# Patient Record
Sex: Female | Born: 2000 | Race: White | Hispanic: No | Marital: Single | Smoking: Former smoker
Health system: Southern US, Community
[De-identification: ages and names within clinical notes are randomized; demographics above are authoritative.]

## PROBLEM LIST (undated history)

## (undated) ENCOUNTER — Inpatient Hospital Stay (HOSPITAL_COMMUNITY): Payer: Self-pay

## (undated) DIAGNOSIS — F99 Mental disorder, not otherwise specified: Secondary | ICD-10-CM

## (undated) DIAGNOSIS — F909 Attention-deficit hyperactivity disorder, unspecified type: Secondary | ICD-10-CM

## (undated) DIAGNOSIS — F32A Depression, unspecified: Secondary | ICD-10-CM

## (undated) DIAGNOSIS — F319 Bipolar disorder, unspecified: Secondary | ICD-10-CM

## (undated) DIAGNOSIS — F419 Anxiety disorder, unspecified: Secondary | ICD-10-CM

## (undated) DIAGNOSIS — T50901A Poisoning by unspecified drugs, medicaments and biological substances, accidental (unintentional), initial encounter: Secondary | ICD-10-CM

## (undated) DIAGNOSIS — R569 Unspecified convulsions: Secondary | ICD-10-CM

## (undated) DIAGNOSIS — T7840XA Allergy, unspecified, initial encounter: Secondary | ICD-10-CM

## (undated) DIAGNOSIS — F329 Major depressive disorder, single episode, unspecified: Secondary | ICD-10-CM

## (undated) HISTORY — DX: Unspecified convulsions: R56.9

---
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
## 2020-05-11 ENCOUNTER — Ambulatory Visit
Admission: EM | Admit: 2020-05-11 | Discharge: 2020-05-11 | Disposition: A | Discharge status: Home / Self Care | Payer: Medicaid Other | Attending: Emergency Medicine | Admitting: Emergency Medicine

## 2020-05-11 DIAGNOSIS — M25512 Pain in left shoulder: Secondary | ICD-10-CM

## 2020-05-11 DIAGNOSIS — G8929 Other chronic pain: Secondary | ICD-10-CM | POA: Diagnosis not present

## 2020-05-11 MED ORDER — NAPROXEN 500 MG PO TABS: 500.0000 mg | ORAL_TABLET | Freq: Two times a day (BID) | ORAL | 0 refills | Status: DC

## 2020-05-11 NOTE — ED Provider Notes (Signed)
[facility]    CSN: [id number] Arrival date & time: 05/11/20  1634      History   Chief Complaint Chief Complaint  Patient presents with  . shoulder tenderness    HPI Ellen Huffman is a 19 y.o. female with history of allergies, depression presenting for left shoulder/axilla.  States is been ongoing for the last 3 weeks.  Patient denies trauma to area, upper extremity numbness, weakness.  No neck pain, fever.  Patient's mother does have history of lupus and is on blood thinner for remote history of pulmonary emboli (unprovoked).  Patient denies history of lupus, blood clot.  No OCP use, recent travel or prolonged stasis.  Has tried stretching without relief.  No chest pain, palpitations, difficulty breathing, cough.   Past Medical History:  Diagnosis Date  . Allergy   . Depression   . Mental disorder   . Overdose Jan 2015    Patient Active Problem List   Diagnosis Date Noted  . Autonomic dysfunction 09/17/2017  . Mild headache 09/17/2017  . Anxiety state 09/17/2017  . Palpitations 09/17/2017  . Deliberate self-cutting 07/14/2014  . MDD (major depressive disorder), recurrent severe, 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  . Cannabis abuse 07/14/2014  . Suicide attempt (HCC) 07/14/2014  . 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 GAD (generalized anxiety disorder) 09/15/2013    History reviewed. No pertinent surgical history.  OB History   No obstetric history on file.      Home Medications    Prior to Admission medications   Medication Sig Start Date End Date Taking? Authorizing Provider  naproxen (NAPROSYN) 500 MG tablet Take 1 tablet (500 mg total) by mouth 2 (two) times daily. 05/11/20   Hall-Potvin, [place], PA-C    Family History Family History  Problem Relation Age of Onset  . Migraines Neg Hx   . Parkinsonism Neg Hx   . Seizures Neg Hx   . ADD / ADHD Neg Hx   . Bipolar disorder Neg Hx     Social  History Social History   Tobacco Use  . Smoking status: Never Smoker  . Smokeless tobacco: Never Used  Substance Use Topics  . Alcohol use: No  . Drug use: Yes    Types: Marijuana     Allergies   Penicillins   Review of Systems As per HPI   Physical Exam Triage Vital Signs ED Triage Vitals  Enc Vitals Group     BP 05/11/20 1643 123/77     Pulse Rate 05/11/20 1643 (!) 104     Resp 05/11/20 1643 16     Temp 05/11/20 1643 99.4 F (37.4 C)     Temp Source 05/11/20 1643 Oral     SpO2 05/11/20 1643 97 %     Weight --      Height --      Head Circumference --      Peak Flow --      Pain Score 05/11/20 1644 0     Pain Loc --      Pain Edu? --      Excl. in GC? --    No data found.  Updated Vital Signs BP 123/77 (BP Location: Left Arm)   Pulse (!) 104   Temp 99.4 F (37.4 C) (Oral)   Resp 16   LMP 04/27/2020   SpO2 97%   Visual Acuity Right Eye Distance:   Left Eye Distance:   Bilateral Distance:    Right Eye  Near:   Left Eye Near:    Bilateral Near:     Physical Exam Constitutional:      General: She is not in acute distress. HENT:     Head: Normocephalic and atraumatic.  Eyes:     General: No scleral icterus.    Pupils: Pupils are equal, round, and reactive to light.  Cardiovascular:     Rate and Rhythm: Normal rate.  Pulmonary:     Effort: Pulmonary effort is normal.  Musculoskeletal:     Cervical back: Normal range of motion and neck supple. No rigidity or tenderness.     Comments: Full active ROM of upper extremities bilaterally.  5/5 strength bilaterally.  Neurovascularly intact bilaterally.  Patient does report decreased sensation to touch in left upper extremity diffusely.  Lymphadenopathy:     Cervical: No cervical adenopathy.  Skin:    Capillary Refill: Capillary refill takes less than 2 seconds.     Coloration: Skin is not jaundiced or pale.     Findings: No bruising, erythema or rash.  Neurological:     Mental Status: She is alert  and oriented to person, place, and time.      UC Treatments / Results  Labs (all labs ordered are listed, but only abnormal results are displayed) Labs Reviewed - No data to display  EKG   Radiology No results found.  Procedures Procedures (including critical care time)  Medications Ordered in UC Medications - No data to display  Initial Impression / Assessment and Plan / UC Course  I have reviewed the triage vital signs and the nursing notes.  Pertinent labs & imaging results that were available during my care of the patient were reviewed by me and considered in my medical decision making (see chart for details).     Patient afebrile, nontoxic, and hemodynamically stable in office.  Exam largely unremarkable except for patient reported sensory change in left arm.  Patient with low risk of ACS given unremarkable personal and family cardiac history.  Will trial rice, stretches as outlined below.  Return precautions discussed, patient verbalized understanding and is agreeable to plan. Final Clinical Impressions(s) / UC Diagnoses   Final diagnoses:  Chronic left shoulder pain     Discharge Instructions     Recommend RICE: rest, ice, compression, elevation as needed for pain.    Heat therapy (hot compress, warm wash rag, hot showers, etc.) can help relax muscles and soothe muscle aches. Cold therapy (ice packs) can be used to help swelling both after injury and after prolonged use of areas of chronic pain/aches.  For pain: Take naproxen twice daily with food. May take Tylenol additionally if needed.  Return for worsening pain, swelling, numbness, discoloration, chest pain, difficulty breathing.    ED Prescriptions    Medication Sig Dispense Auth. Provider   naproxen (NAPROSYN) 500 MG tablet Take 1 tablet (500 mg total) by mouth 2 (two) times daily. 30 tablet Hall-Potvin, [place], PA-C     I have reviewed the PDMP during this encounter.   Hall-Potvin, [place],  [place] 05/12/20 1234

## 2020-05-11 NOTE — Discharge Instructions (Signed)
Recommend RICE: rest, ice, compression, elevation as needed for pain.    Heat therapy (hot compress, warm wash rag, hot showers, etc.) can help relax muscles and soothe muscle aches. Cold therapy (ice packs) can be used to help swelling both after injury and after prolonged use of areas of chronic pain/aches.  For pain: Take naproxen twice daily with food. May take Tylenol additionally if needed.  Return for worsening pain, swelling, numbness, discoloration, chest pain, difficulty breathing.

## 2020-05-11 NOTE — ED Triage Notes (Signed)
Pt c/o of tender to touch and a different feeling to lt upper shoulder/under arm area for the past 3wks. States feels different then rt side. Pt denies pain or injury.

## 2020-09-04 ENCOUNTER — Encounter: Payer: Self-pay | Admitting: Emergency Medicine

## 2020-09-04 ENCOUNTER — Ambulatory Visit
Admission: EM | Admit: 2020-09-04 | Discharge: 2020-09-04 | Disposition: A | Discharge status: Home / Self Care | Payer: Medicaid Other | Attending: Emergency Medicine | Admitting: Emergency Medicine

## 2020-09-04 ENCOUNTER — Other Ambulatory Visit: Payer: Self-pay

## 2020-09-04 DIAGNOSIS — R110 Nausea: Secondary | ICD-10-CM | POA: Diagnosis not present

## 2020-09-04 DIAGNOSIS — F1113 Opioid abuse with withdrawal: Secondary | ICD-10-CM

## 2020-09-04 LAB — POCT URINALYSIS DIP (MANUAL ENTRY)
Bilirubin, UA: NEGATIVE
Blood, UA: NEGATIVE
Glucose, UA: NEGATIVE mg/dL
Leukocytes, UA: NEGATIVE
Nitrite, UA: NEGATIVE
Protein Ur, POC: 30 mg/dL — AB
Spec Grav, UA: >=1.03 — AB (ref 1.010–1.025)
Urobilinogen, UA: 0.2 E.U./dL
pH, UA: 5.5 (ref 5.0–8.0)

## 2020-09-04 MED ORDER — ONDANSETRON 4 MG PO TBDP: 4.0000 mg | ORAL_TABLET | Freq: Three times a day (TID) | ORAL | 0 refills | Status: DC | PRN

## 2020-09-04 NOTE — ED Provider Notes (Signed)
[facility]    CSN: [id number] Arrival date & time: 09/04/20  1348      History   Chief Complaint Chief Complaint  Patient presents with  . Dysuria  . Withdrawal    HPI Ellen Huffman is a 19 y.o. female  Presenting for dysuria, nausea for the last few days.  Patient denies urinary frequency, polydipsia, polyphagia, abdominal pain, vaginal pain, pelvic pain, vaginal discharge.  No change in diet, lifestyle.  Does admit to daily Percocet use until Monday: No chest pain, tremulousness, palpitations, severe abdominal pain or diarrhea, vomiting.  Denies SI/HI.  Has been seen by behavioral health in the past, working to find therapist currently.  Past Medical History:  Diagnosis Date  . Allergy   . Depression   . Mental disorder   . Overdose Jan 2015    Patient Active Problem List   Diagnosis Date Noted  . Autonomic dysfunction 09/17/2017  . Mild headache 09/17/2017  . Anxiety state 09/17/2017  . Palpitations 09/17/2017  . Deliberate self-cutting 07/14/2014  . MDD (major depressive disorder), recurrent severe, 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  . Cannabis abuse 07/14/2014  . Suicide attempt (HCC) 07/14/2014  . 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 GAD (generalized anxiety disorder) 09/15/2013    History reviewed. No pertinent surgical history.  OB History   No obstetric history on file.      Home Medications    Prior to Admission medications   Medication Sig Start Date End Date Taking? Authorizing Provider  naproxen (NAPROSYN) 500 MG tablet Take 1 tablet (500 mg total) by mouth 2 (two) times daily. 05/11/20   Hall-Potvin, [place], PA-C  ondansetron (ZOFRAN ODT) 4 MG disintegrating tablet Take 1 tablet (4 mg total) by mouth every 8 (eight) hours as needed for nausea or vomiting. 09/04/20   Hall-Potvin, [place], PA-C    Family History Family History  Problem Relation Age of Onset  . Migraines Neg Hx   .  Parkinsonism Neg Hx   . Seizures Neg Hx   . ADD / ADHD Neg Hx   . Bipolar disorder Neg Hx     Social History Social History   Tobacco Use  . Smoking status: Never Smoker  . Smokeless tobacco: Never Used  Substance Use Topics  . Alcohol use: No  . Drug use: Yes    Types: Marijuana    Comment: percocet     Allergies   Penicillins   Review of Systems As per HPI   Physical Exam Triage Vital Signs ED Triage Vitals  Enc Vitals Group     BP 09/04/20 1502 137/70     Pulse Rate 09/04/20 1502 (!) 130     Resp 09/04/20 1502 18     Temp 09/04/20 1502 98.6 F (37 C)     Temp Source 09/04/20 1502 Oral     SpO2 09/04/20 1502 98 %     Weight --      Height --      Head Circumference --      Peak Flow --      Pain Score 09/04/20 1503 3     Pain Loc --      Pain Edu? --      Excl. in GC? --    No data found.  Updated Vital Signs BP 137/70 (BP Location: Left Arm)   Pulse (!) 106   Temp 98.6 F (37 C) (Oral)   Resp 18   SpO2 98%   Visual  Acuity Right Eye Distance:   Left Eye Distance:   Bilateral Distance:    Right Eye Near:   Left Eye Near:    Bilateral Near:     Physical Exam Constitutional:      General: She is not in acute distress. HENT:     Head: Normocephalic and atraumatic.  Eyes:     General: No scleral icterus.    Pupils: Pupils are equal, round, and reactive to light.  Cardiovascular:     Rate and Rhythm: Normal rate.  Pulmonary:     Effort: Pulmonary effort is normal.  Abdominal:     General: Bowel sounds are normal.     Palpations: Abdomen is soft.     Tenderness: There is no abdominal tenderness. There is no right CVA tenderness, left CVA tenderness or guarding.  Skin:    Capillary Refill: Capillary refill takes less than 2 seconds.     Coloration: Skin is not jaundiced or pale.  Neurological:     General: No focal deficit present.     Mental Status: She is alert and oriented to person, place, and time.     Deep Tendon Reflexes:  Reflexes normal.     Comments: No tremulousness or diaphoresis      UC Treatments / Results  Labs (all labs ordered are listed, but only abnormal results are displayed) Labs Reviewed  POCT URINALYSIS DIP (MANUAL ENTRY) - Abnormal; Notable for the following components:      Result Value   Ketones, POC UA trace (5) (*)    Spec Grav, UA >=1.030 (*)    Protein Ur, POC =30 (*)    All other components within normal limits    EKG   Radiology No results found.  Procedures Procedures (including critical care time)  Medications Ordered in UC Medications - No data to display  Initial Impression / Assessment and Plan / UC Course  I have reviewed the triage vital signs and the nursing notes.  Pertinent labs & imaging results that were available during my care of the patient were reviewed by me and considered in my medical decision making (see chart for details).     Patient afebrile, nontoxic in office today.  Urine dipstick with protein, elevated specific gravity, trace ketones.  No neurocognitive deficit.  COWS mild.  Reviewed supportive care, provided resources for both mental health and opioid abuse/withdrawal.  Return precautions discussed, pt verbalized understanding and is agreeable to plan. Final Clinical Impressions(s) / UC Diagnoses   Final diagnoses:  Nausea without vomiting  Opioid abuse with withdrawal     Discharge Instructions     Zofran for nausea. Go to ER for shakiness, chest pain, palpitations.    ED Prescriptions    Medication Sig Dispense Auth. Provider   ondansetron (ZOFRAN ODT) 4 MG disintegrating tablet Take 1 tablet (4 mg total) by mouth every 8 (eight) hours as needed for nausea or vomiting. 21 tablet Hall-Potvin, [place], PA-C     PDMP not reviewed this encounter.   Hall-Potvin, [place], [place] 09/04/20 1648

## 2020-09-04 NOTE — ED Triage Notes (Signed)
Pt here for dysuria; pt also sts withdrawal sx from opioids; pt sts was taking 15-30mg  percocet daily up until Monday and has now quit taking; pt sts not feeling well having some nausea and not sleeping well; denies SI/HI

## 2020-09-04 NOTE — Discharge Instructions (Addendum)
Zofran for nausea. Go to ER for shakiness, chest pain, palpitations.

## 2020-10-25 ENCOUNTER — Ambulatory Visit
Admission: EM | Admit: 2020-10-25 | Discharge: 2020-10-25 | Disposition: A | Discharge status: Home / Self Care | Payer: Medicaid Other | Attending: Emergency Medicine | Admitting: Emergency Medicine

## 2020-10-25 ENCOUNTER — Other Ambulatory Visit: Payer: Self-pay

## 2020-10-25 DIAGNOSIS — R0781 Pleurodynia: Secondary | ICD-10-CM

## 2020-10-25 DIAGNOSIS — Z1152 Encounter for screening for COVID-19: Secondary | ICD-10-CM

## 2020-10-25 MED ORDER — NAPROXEN 500 MG PO TABS: 500.0000 mg | ORAL_TABLET | Freq: Two times a day (BID) | ORAL | 0 refills | Status: DC

## 2020-10-25 MED ORDER — ALBUTEROL SULFATE HFA 108 (90 BASE) MCG/ACT IN AERS: 2.0000 | INHALATION_SPRAY | Freq: Four times a day (QID) | RESPIRATORY_TRACT | 2 refills | Status: DC | PRN

## 2020-10-25 NOTE — ED Triage Notes (Signed)
Pt complains of a sore throat and chest tightness when taking a full breath since yesterday. Pt also notes some pain when taking a full breath as well. Pt is aox4 and ambulatory.

## 2020-10-25 NOTE — Discharge Instructions (Addendum)
Go to ER for worsening pain, difficulty breathing, or you pass out.

## 2020-10-25 NOTE — ED Provider Notes (Signed)
[facility]    CSN: [id number] Arrival date & time: 10/25/20  1415      History   Chief Complaint Chief Complaint  Patient presents with  . Sore Throat    since yesterday    HPI Ellen Huffman is a 19 y.o. female  With history as below presenting for sore throat and chest tightness with inspiration since yesterday.  Patient also states deep breaths causes sternum to hurt.  Denies tenderness palpation, shortness of breath, palpitations or lightheadedness.  Has not taken thing for this.  Denies recent illness or known sick contacts.  Denies IV drug use.  Past Medical History:  Diagnosis Date  . Allergy   . Depression   . Mental disorder   . Overdose Jan 2015    Patient Active Problem List   Diagnosis Date Noted  . Autonomic dysfunction 09/17/2017  . Mild headache 09/17/2017  . Anxiety state 09/17/2017  . Palpitations 09/17/2017  . Deliberate self-cutting 07/14/2014  . MDD (major depressive disorder), recurrent severe, 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  . Cannabis abuse 07/14/2014  . Suicide attempt (HCC) 07/14/2014  . 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 GAD (generalized anxiety disorder) 09/15/2013    History reviewed. No pertinent surgical history.  OB History   No obstetric history on file.      Home Medications    Prior to Admission medications   Medication Sig Start Date End Date Taking? Authorizing Provider  albuterol (VENTOLIN HFA) 108 (90 Base) MCG/ACT inhaler Inhale 2 puffs into the lungs every 6 (six) hours as needed for wheezing or shortness of breath. 10/25/20   Hall-Potvin, [place], PA-C  naproxen (NAPROSYN) 500 MG tablet Take 1 tablet (500 mg total) by mouth 2 (two) times daily. 10/25/20   Hall-Potvin, [place], PA-C    Family History Family History  Problem Relation Age of Onset  . Migraines Neg Hx   . Parkinsonism Neg Hx   . Seizures Neg Hx   . ADD / ADHD Neg Hx   . Bipolar disorder  Neg Hx     Social History Social History   Tobacco Use  . Smoking status: Never Smoker  . Smokeless tobacco: Never Used  Vaping Use  . Vaping Use: Every day  Substance Use Topics  . Alcohol use: No  . Drug use: Yes    Types: Marijuana    Comment: percocet     Allergies   Penicillins   Review of Systems Review of Systems  Constitutional: Negative for fatigue and fever.  HENT: Positive for sore throat. Negative for ear pain, sinus pain, trouble swallowing and voice change.   Eyes: Negative for pain, redness and visual disturbance.  Respiratory: Positive for chest tightness. Negative for cough and shortness of breath.   Cardiovascular: Negative for chest pain and palpitations.  Gastrointestinal: Negative for abdominal pain, diarrhea and vomiting.  Musculoskeletal: Negative for arthralgias and myalgias.  Skin: Negative for rash and wound.  Neurological: Negative for syncope and headaches.     Physical Exam Triage Vital Signs ED Triage Vitals  Enc Vitals Group     BP      Pulse      Resp      Temp      Temp src      SpO2      Weight      Height      Head Circumference      Peak Flow      Pain Score  Pain Loc      Pain Edu?      Excl. in GC?    No data found.  Updated Vital Signs BP 114/69 (BP Location: Right Arm)   Pulse (!) 109   Temp 99.2 F (37.3 C) (Oral)   Resp 18   LMP 10/24/2020 (Exact Date)   SpO2 96%   Visual Acuity Right Eye Distance:   Left Eye Distance:   Bilateral Distance:    Right Eye Near:   Left Eye Near:    Bilateral Near:     Physical Exam Constitutional:      General: She is not in acute distress.    Appearance: She is well-developed and normal weight. She is not ill-appearing or diaphoretic.  HENT:     Head: Normocephalic and atraumatic.     Right Ear: Tympanic membrane and ear canal normal.     Left Ear: Tympanic membrane and ear canal normal.     Mouth/Throat:     Mouth: Mucous membranes are moist.      Pharynx: Oropharynx is clear. Uvula midline. No oropharyngeal exudate, posterior oropharyngeal erythema or uvula swelling.     Tonsils: No tonsillar exudate.  Eyes:     General: No scleral icterus.    Conjunctiva/sclera: Conjunctivae normal.     Pupils: Pupils are equal, round, and reactive to light.  Neck:     Comments: Trachea midline, negative JVD Cardiovascular:     Rate and Rhythm: Normal rate and regular rhythm.     Heart sounds: No murmur heard.  No friction rub. No gallop.   Pulmonary:     Effort: Pulmonary effort is normal. No respiratory distress.     Breath sounds: No wheezing, rhonchi or rales.  Musculoskeletal:     Cervical back: Neck supple. No tenderness.  Lymphadenopathy:     Cervical: No cervical adenopathy.  Skin:    Capillary Refill: Capillary refill takes less than 2 seconds.     Coloration: Skin is not jaundiced or pale.     Findings: No rash.  Neurological:     General: No focal deficit present.     Mental Status: She is alert and oriented to person, place, and time.      UC Treatments / Results  Labs (all labs ordered are listed, but only abnormal results are displayed) Labs Reviewed  NOVEL CORONAVIRUS, NAA    EKG   Radiology No results found.  Procedures Procedures (including critical care time)  Medications Ordered in UC Medications - No data to display  Initial Impression / Assessment and Plan / UC Course  I have reviewed the triage vital signs and the nursing notes.  Pertinent labs & imaging results that were available during my care of the patient were reviewed by me and considered in my medical decision making (see chart for details).     Patient afebrile, nontoxic, with SpO2 96%.  EKG done in office w/o previous to compare: Normal sinus rhythm with ventricular rate 70 bpm.  No QTC prolongation, ST elevation or depression.  Low concern for pericarditis.  Covid PCR pending.  Patient to quarantine until results are back.  We will  treat supportively as outlined below.  Return precautions discussed, patient verbalized understanding and is agreeable to plan. Final Clinical Impressions(s) / UC Diagnoses   Final diagnoses:  Encounter for screening for COVID-19  Pleuritic chest pain     Discharge Instructions     Go to ER for worsening pain, difficulty breathing, or you pass  out.    ED Prescriptions    Medication Sig Dispense Auth. Provider   albuterol (VENTOLIN HFA) 108 (90 Base) MCG/ACT inhaler Inhale 2 puffs into the lungs every 6 (six) hours as needed for wheezing or shortness of breath. 8 g Hall-Potvin, [place], PA-C   naproxen (NAPROSYN) 500 MG tablet Take 1 tablet (500 mg total) by mouth 2 (two) times daily. 30 tablet Hall-Potvin, [place], PA-C     PDMP not reviewed this encounter.   Hall-Potvin, [place], [place] 10/25/20 1509

## 2020-10-27 LAB — NOVEL CORONAVIRUS, NAA: SARS-CoV-2, NAA: NOT DETECTED

## 2020-10-27 LAB — SARS-COV-2, NAA 2 DAY TAT

## 2020-11-03 ENCOUNTER — Ambulatory Visit (HOSPITAL_COMMUNITY): Payer: Medicaid Other | Admitting: Clinical

## 2020-12-25 DIAGNOSIS — R569 Unspecified convulsions: Secondary | ICD-10-CM

## 2020-12-25 HISTORY — DX: Unspecified convulsions: R56.9

## 2021-03-07 ENCOUNTER — Ambulatory Visit
Admission: EM | Admit: 2021-03-07 | Discharge: 2021-03-07 | Disposition: A | Discharge status: Home / Self Care | Payer: Medicaid Other

## 2021-03-31 ENCOUNTER — Encounter (HOSPITAL_COMMUNITY): Payer: Self-pay | Admitting: Psychiatry

## 2021-03-31 ENCOUNTER — Other Ambulatory Visit: Payer: Self-pay

## 2021-03-31 ENCOUNTER — Telehealth (INDEPENDENT_AMBULATORY_CARE_PROVIDER_SITE_OTHER): Payer: Medicaid Other | Admitting: Psychiatry

## 2021-03-31 DIAGNOSIS — F1114 Opioid abuse with opioid-induced mood disorder: Secondary | ICD-10-CM

## 2021-03-31 DIAGNOSIS — F411 Generalized anxiety disorder: Secondary | ICD-10-CM

## 2021-03-31 MED ORDER — TRAZODONE HCL 50 MG PO TABS: 50.0000 mg | ORAL_TABLET | Freq: Every day | ORAL | 2 refills | Status: DC

## 2021-03-31 MED ORDER — HYDROXYZINE HCL 10 MG PO TABS: 10.0000 mg | ORAL_TABLET | Freq: Three times a day (TID) | ORAL | 2 refills | Status: DC | PRN

## 2021-03-31 MED ORDER — ARIPIPRAZOLE 5 MG PO TABS: 5.0000 mg | ORAL_TABLET | Freq: Every day | ORAL | 2 refills | Status: DC
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
## 2021-04-06 ENCOUNTER — Ambulatory Visit: Payer: Medicaid Other | Admitting: Nurse Practitioner

## 2021-06-11 ENCOUNTER — Emergency Department (HOSPITAL_COMMUNITY): Payer: Medicaid Other

## 2021-06-11 ENCOUNTER — Emergency Department (HOSPITAL_COMMUNITY)
Admission: EM | Admit: 2021-06-11 | Discharge: 2021-06-12 | Disposition: A | Discharge status: Home / Self Care | Payer: Medicaid Other | Attending: Emergency Medicine | Admitting: Emergency Medicine

## 2021-06-11 DIAGNOSIS — F13239 Sedative, hypnotic or anxiolytic dependence with withdrawal, unspecified: Secondary | ICD-10-CM | POA: Diagnosis not present

## 2021-06-11 DIAGNOSIS — Z5321 Procedure and treatment not carried out due to patient leaving prior to being seen by health care provider: Secondary | ICD-10-CM | POA: Insufficient documentation

## 2021-06-11 DIAGNOSIS — R569 Unspecified convulsions: Secondary | ICD-10-CM | POA: Diagnosis present

## 2021-06-11 LAB — COMPREHENSIVE METABOLIC PANEL
ALT: 14 U/L (ref 0–44)
AST: 19 U/L (ref 15–41)
Albumin: 4.1 g/dL (ref 3.5–5.0)
Alkaline Phosphatase: 56 U/L (ref 38–126)
Anion gap: 9 (ref 5–15)
BUN: 8 mg/dL (ref 6–20)
CO2: 27 mmol/L (ref 22–32)
Calcium: 9 mg/dL (ref 8.9–10.3)
Chloride: 104 mmol/L (ref 98–111)
Creatinine, Ser: 0.75 mg/dL (ref 0.44–1.00)
GFR, Estimated: >60 mL/min (ref >60)
Glucose, Bld: 60 mg/dL — ABNORMAL LOW (ref 70–99)
Potassium: 3.7 mmol/L (ref 3.5–5.1)
Sodium: 140 mmol/L (ref 135–145)
Total Bilirubin: 0.3 mg/dL (ref 0.3–1.2)
Total Protein: 7.6 g/dL (ref 6.5–8.1)

## 2021-06-11 LAB — ETHANOL: Alcohol, Ethyl (B): <10 mg/dL (ref <10)

## 2021-06-11 LAB — CBC WITH DIFFERENTIAL/PLATELET
Abs Immature Granulocytes: 0.01 10*3/uL (ref 0.00–0.07)
Basophils Absolute: 0 10*3/uL (ref 0.0–0.1)
Basophils Relative: 1 %
Eosinophils Absolute: 0.1 10*3/uL (ref 0.0–0.5)
Eosinophils Relative: 2 %
HCT: 45.8 % (ref 36.0–46.0)
Hemoglobin: 14.7 g/dL (ref 12.0–15.0)
Immature Granulocytes: 0 %
Lymphocytes Relative: 38 %
Lymphs Abs: 1.7 10*3/uL (ref 0.7–4.0)
MCH: 28.2 pg (ref 26.0–34.0)
MCHC: 32.1 g/dL (ref 30.0–36.0)
MCV: 87.7 fL (ref 80.0–100.0)
Monocytes Absolute: 0.3 10*3/uL (ref 0.1–1.0)
Monocytes Relative: 7 %
Neutro Abs: 2.4 10*3/uL (ref 1.7–7.7)
Neutrophils Relative %: 52 %
Platelets: 274 10*3/uL (ref 150–400)
RBC: 5.22 MIL/uL — ABNORMAL HIGH (ref 3.87–5.11)
RDW: 15.2 % (ref 11.5–15.5)
WBC: 4.5 10*3/uL (ref 4.0–10.5)
nRBC: 0 % (ref 0.0–0.2)

## 2021-06-11 LAB — RAPID URINE DRUG SCREEN, HOSP PERFORMED
Amphetamines: NOT DETECTED
Barbiturates: NOT DETECTED
Benzodiazepines: POSITIVE — AB
Cocaine: POSITIVE — AB
Opiates: NOT DETECTED
Tetrahydrocannabinol: POSITIVE — AB

## 2021-06-11 LAB — I-STAT BETA HCG BLOOD, ED (MC, WL, AP ONLY): I-stat hCG, quantitative: <5 m[IU]/mL (ref <5)

## 2021-06-11 NOTE — ED Provider Notes (Signed)
Emergency Medicine Provider Triage Evaluation Note  Ellen Huffman , a 20 y.o. female  was evaluated in triage.  Pt complains of seizures from withdrawal from benzos, history of abuse, states if takes a 1/4 xanax throughout the day helps her. Sometimes takes more than she needs and when this happens for several days in a row and then tries to cut back. Last seizure was last weekend. Wants to get checked to make sure she isn't causing damage, feels like her memory isn't as good as it used to be as she is now sensitive to lights, has migraines.  Had a fall off the bed after 1 seizure. No other injuries.  Takes Xanax for anxiety, is not prescribed this, feels like her anxiety meds don't work for her. Takes hydroxyzine.  Drinks alcohol occasionally, last had alcohol 2-3 days ago.   Review of Systems  Positive: Seizures, headache Negative: CP, abdominal pain  Physical Exam  BP 138/75   Pulse (!) 103   Temp 98 F (36.7 C) (Oral)   Resp 18   LMP 06/11/2021   SpO2 100%  Gen:   Awake, no distress   Resp:  Normal effort  MSK:   Moves extremities without difficulty  Other:  Speech clear, gait normal  Medical Decision Making  Medically screening exam initiated at 3:30 PM.  Appropriate orders placed.  Ellen Huffman was informed that the remainder of the evaluation will be completed by another provider, this initial triage assessment does not replace that evaluation, and the importance of remaining in the ED until their evaluation is complete.     [name], PA-C 06/11/21 1533    Cheryll Cockayne, MD 06/12/21 [phone]

## 2021-06-11 NOTE — ED Triage Notes (Signed)
Pt reports seizures, she has history of withdrawal from benzodioxanes. She last took xanax at 10am this morning. She said she felt like she was going to have a seizure this morning but was able to pull herself out of it. Last seizure was 1 week ago, she feels like she had a concussion.

## 2021-06-30 ENCOUNTER — Encounter (HOSPITAL_COMMUNITY): Payer: Self-pay | Admitting: Psychiatry

## 2021-06-30 ENCOUNTER — Telehealth (INDEPENDENT_AMBULATORY_CARE_PROVIDER_SITE_OTHER): Payer: Medicaid Other | Admitting: Psychiatry

## 2021-06-30 ENCOUNTER — Other Ambulatory Visit: Payer: Self-pay

## 2021-06-30 DIAGNOSIS — R569 Unspecified convulsions: Secondary | ICD-10-CM

## 2021-06-30 DIAGNOSIS — F3110 Bipolar disorder, current episode manic without psychotic features, unspecified: Secondary | ICD-10-CM | POA: Diagnosis not present

## 2021-06-30 DIAGNOSIS — F411 Generalized anxiety disorder: Secondary | ICD-10-CM | POA: Diagnosis not present

## 2021-06-30 MED ORDER — LAMOTRIGINE 25 MG PO TABS: 25.0000 mg | ORAL_TABLET | Freq: Every day | ORAL | 2 refills | Status: DC

## 2021-06-30 MED ORDER — TRAZODONE HCL 100 MG PO TABS: 100.0000 mg | ORAL_TABLET | Freq: Every day | ORAL | 2 refills | Status: DC

## 2021-06-30 MED ORDER — HYDROXYZINE HCL 25 MG PO TABS: 25.0000 mg | ORAL_TABLET | Freq: Three times a day (TID) | ORAL | 2 refills | Status: DC | PRN

## 2021-06-30 MED ORDER — ARIPIPRAZOLE 5 MG PO TABS: 5.0000 mg | ORAL_TABLET | Freq: Every day | ORAL | 2 refills | Status: DC

## 2021-06-30 NOTE — Progress Notes (Signed)
BH MD/PA/NP OP Progress Note Virtual Visit via Video Note  I connected with Ellen Huffman on 06/30/21 at  2:30 PM EDT by a video enabled telemedicine application and verified that I am speaking with the correct person using two identifiers.  Location: Patient: Home Provider: Clinic   I discussed the limitations of evaluation and management by telemedicine and the availability of in person appointments. The patient expressed understanding and agreed to proceed.  I provided 30 minutes of non-face-to-face time during this encounter.   06/30/2021 2:56 PM Ellen Huffman  MRN:  [id number]  Chief Complaint: "I haven't been to depressed but I have been anxious and my sleeping is not great"  HPI: 20 year old female seen today for follow up psychiatric evaluation.  She has a psychiatric history of anxiety, depression, SI/SA, opioid use disorder, and self injures behaviors. She is managed on Zoloft 25 mg daily, hydroxyzine 10 mg 3 times daily as needed, and trazodone 50 mg nightly as needed.  She notes her medications are somewhat effective in managing her psychiatric conditions.   Today patient was logged in virtually however her camera was turned off.  She was pleasant, cooperative, and engaged in conversation.  She informed Clinical research associate that she has been doing somewhat better since her last visit.  She notes that she has not been so depressed but has been feeling more anxious and not getting enough sleep.  Patient informed writer that she sleeps approximately 4 to 5 hours nightly.  She notes lack of sleep may worsen her anxiety and depression.  Today provider conducted a GAD-7 and patient scored a 21, at her last visit she scored a 21.  She notes that she is worried about her future and how will turn out.  She notes that she feels that she has not accomplished what she believes she should have thus far.  Provider also conducted a PHQ-9 and patient scored a 21, at her last visit she scored 27.  Today she endorses  passive SI however notes that she does not want to harm her self.  She denies SI/HI/VH. She does endorse auditory hallucinations (whispers) however denies visual hallucinations.  Patient notes that most days she feels paranoid.  She also notes that at times she has irrational thinking noted that she does not pick something up she will die.  Patient informed writer that her mood fluctuates, she is irritable, impulsively spends on food and clothing, and is easily distractible.  Patient informed writer that her appetite has decreased and she is lost 10 pounds since her last visit.  Patient informed Clinical research associate that she has been sober from Xanax and other illegal substances for a few weeks.  She however notes that she continues to have withdrawal symptoms.  She notes that she has seizure-like activities.   Provider reviewed patient medical records and recently she was seen at [facility] long ED on 06/11/2021 due to withdrawal from benzos.  Patient notes that she left the ED because she was waiting for over 6 hours.  While in the ED she tested positive for cocaine, benzodiazepine, and marijuana.  Provider referred patient to [facility] neurology for further assessment.   Patient informed Clinical research associate that she currently works for a [employer].  She notes that she likes her job.  Today she is agreeable to increasing trazodone 50 mg to 100 mg to help manage anxiety, depression, and sleep.  She is also agreeable to increasing hydroxyzine 10 mg 3 times daily to 25 mg 3 times daily to  help manage anxiety.  She will start Lamictal 25 mg for 2 weeks and then increase to 50 mg for 2 weeks to help stabilize mood.  She will continue all other medications as prwscribed.  [facility].  No other concerns noted at this time.    Visit Diagnosis:    ICD-10-CM   1. Bipolar I disorder, most recent episode (or current) manic (HCC)  F31.10 ARIPiprazole (ABILIFY) 5 MG tablet     traZODone (DESYREL) 100 MG tablet    lamoTRIgine (LAMICTAL) 25 MG tablet    2. Generalized anxiety disorder  F41.1 hydrOXYzine (ATARAX/VISTARIL) 25 MG tablet    traZODone (DESYREL) 100 MG tablet    3. Seizures (HCC)  R56.9 Ambulatory referral to Neurology      Past Psychiatric History:  Depression, Anxiety, SI/SA, opoid use, self injurious behavior (cutting)  Past Medical History:  Past Medical History:  Diagnosis Date   Allergy    Depression    Mental disorder    Overdose Jan 2015   No past surgical history on file.  Family Psychiatric History: Mother depression, notes one of her maternal cousin has mental health conditions but unaware of which one.   Family History:  Family History  Problem Relation Age of Onset   Migraines Neg Hx    Parkinsonism Neg Hx    Seizures Neg Hx    ADD / ADHD Neg Hx    Bipolar disorder Neg Hx     Social History:  Social History   Socioeconomic History   Marital status: Single    Spouse name: Not on file   Number of children: Not on file   Years of education: Not on file   Highest education level: Not on file  Occupational History   Not on file  Tobacco Use   Smoking status: Never   Smokeless tobacco: Never  Vaping Use   Vaping Use: Every day  Substance and Sexual Activity   Alcohol use: No   Drug use: Yes    Types: Marijuana    Comment: percocet   Sexual activity: Not Currently    Birth control/protection: None  Other Topics Concern   Not on file  Social History Narrative   Grade:11   School Name:[employer]   How does patient do in school: average   Patient lives with: Dad- his girlfriend and her daughter- mom with her at visit   Does patient have and IEP/504 Plan in school? No      What are the patient's hobbies or interest? Sleep- no hobbies   Social Determinants of Health   Financial Resource Strain: Not on file  Food Insecurity: Not on file  Transportation Needs: Not on file  Physical Activity: Not on file   Stress: Not on file  Social Connections: Not on file    Allergies:  Allergies  Allergen Reactions   Penicillins Rash    Metabolic Disorder Labs: Lab Results  Component Value Date   HGBA1C 5.2 12/13/2013   MPG 103 12/13/2013   No results found for: PROLACTIN Lab Results  Component Value Date   CHOL 160 12/13/2013   TRIG 49 12/13/2013   HDL 61 12/13/2013   CHOLHDL 2.6 12/13/2013   VLDL 10 12/13/2013   LDLCALC 89 12/13/2013   Lab Results  Component Value Date   TSH 1.767 12/13/2013   TSH 1.226 09/15/2013    Therapeutic Level Labs: No results found for: LITHIUM No results found for: VALPROATE  No components found for:  CBMZ  Current Medications: Current Outpatient Medications  Medication Sig Dispense Refill   lamoTRIgine (LAMICTAL) 25 MG tablet Take 1 tablet (25 mg total) by mouth daily. 60 tablet 2   albuterol (VENTOLIN HFA) 108 (90 Base) MCG/ACT inhaler Inhale 2 puffs into the lungs every 6 (six) hours as needed for wheezing or shortness of breath. 8 g 2   ARIPiprazole (ABILIFY) 5 MG tablet Take 1 tablet (5 mg total) by mouth daily. 30 tablet 2   hydrOXYzine (ATARAX/VISTARIL) 25 MG tablet Take 1 tablet (25 mg total) by mouth 3 (three) times daily as needed. 90 tablet 2   naproxen (NAPROSYN) 500 MG tablet Take 1 tablet (500 mg total) by mouth 2 (two) times daily. 30 tablet 0   traZODone (DESYREL) 100 MG tablet Take 1 tablet (100 mg total) by mouth at bedtime. 30 tablet 2   No current facility-administered medications for this visit.     Musculoskeletal: Strength & Muscle Tone:  Unable to assess patient's camera turned off Gait & Station:  Unable to assess patient's camera turned off Patient leans: N/A  Psychiatric Specialty Exam: Review of Systems  Last menstrual period 06/11/2021.There is no height or weight on file to calculate BMI.  General Appearance:  Unable to assess patient's camera turned off  Eye Contact:   Unable to assess patient's camera turned  off  Speech:  Clear and Coherent and Normal Rate  Volume:  Normal  Mood:  Anxious, Depressed, and Irritable  Affect:  Appropriate and Congruent  Thought Process:  Coherent, Goal Directed, and Linear  Orientation:  Full (Time, Place, and Person)  Thought Content: Logical, Hallucinations: Auditory, Ideas of Reference:   Paranoia, and Paranoid Ideation   Suicidal Thoughts:  Yes.  without intent/plan  Homicidal Thoughts:  No  Memory:  Immediate;   Good Recent;   Good Remote;   Good  Judgement:  Fair  Insight:  Fair  Psychomotor Activity:   Unable to assess patient's camera turned off  Concentration:  Concentration: Good and Attention Span: Good  Recall:  Good  Fund of Knowledge: Good  Language: Good  Akathisia:   Unable to assess patient's camera turned off  Handed:  Right  AIMS (if indicated): not done  Assets:  Communication Skills Desire for Improvement Financial Resources/Insurance Housing Leisure Time Vocational/Educational  ADL's:  Intact  Cognition: WNL  Sleep:  Poor   Screenings: GAD-7    Flowsheet Row Video Visit from 06/30/2021 in [facility] Video Visit from 03/31/2021 in [facility]  Total GAD-7 Score 21 21      MDI    Flowsheet Row [facility] Counselor from 03/18/2013 in [facility] [place]  Total Score (max 50) 14 35      PHQ2-9    Flowsheet Row Video Visit from 06/30/2021 in [facility] Video Visit from 03/31/2021 in [facility]  PHQ-2 Total Score 4 6  PHQ-9 Total Score 21 27      Flowsheet Row Video Visit from 06/30/2021 in [facility] ED from 06/11/2021 in [facility] Video Visit from 03/31/2021 in [facility]  C-SSRS RISK CATEGORY Error: Q7 should not be  populated when Q6 is No No Risk Error: Q7 should not be populated when Q6 is No        Assessment and Plan:  Patient endorses symptoms of anxiety, depression, and hypomania.  She notes that she continues to have seizure-like activities since being off of Xanax and other prescription medications.  Patient referred to [facility] neurology for further assessment. Today she is agreeable to increasing trazodone 50 mg to 100 mg to help manage anxiety, depression, and sleep.  She is also agreeable to increasing hydroxyzine 10 mg 3 times daily to 25 mg 3 times daily to help manage anxiety.  She will start Lamictal 25 mg for 2 weeks and then increase to 50 mg for 2 weeks to help stabilize mood.  She will continue all other medications as prescribed.    1. Generalized anxiety disorder  Increased- hydrOXYzine (ATARAX/VISTARIL) 25 MG tablet; Take 1 tablet (25 mg total) by mouth 3 (three) times daily as needed.  Dispense: 90 tablet; Refill: 2 Increased- traZODone (DESYREL) 100 MG tablet; Take 1 tablet (100 mg total) by mouth at bedtime.  Dispense: 30 tablet; Refill: 2  2. Bipolar I disorder, most recent episode (or current) manic (HCC)  Continue- ARIPiprazole (ABILIFY) 5 MG tablet; Take 1 tablet (5 mg total) by mouth daily.  Dispense: 30 tablet; Refill: 2 Increased- traZODone (DESYREL) 100 MG tablet; Take 1 tablet (100 mg total) by mouth at bedtime.  Dispense: 30 tablet; Refill: 2 Start- lamoTRIgine (LAMICTAL) 25 MG tablet; Take 1 tablet (25 mg total) by mouth daily.  Dispense: 60 tablet; Refill: 2  3. Seizures (HCC)  - Ambulatory referral to Neurology  Follow-up in 1 month  Shanna Cisco, NP 06/30/2021, 2:56 PM

## 2021-07-05 ENCOUNTER — Telehealth (HOSPITAL_COMMUNITY): Payer: Self-pay | Admitting: *Deleted

## 2021-07-05 NOTE — Telephone Encounter (Signed)
SPOKE WITH A [employer] ADVOCATE & WAS  INFORMED PATIENT'S NAME ISN'T LISTED UNDER MEMBER'S CARD  #  UNABLE TO COMPLETE P.A. FOR 5 MG TABLET ARIPIPRAZOLE

## 2021-07-06 ENCOUNTER — Telehealth (HOSPITAL_COMMUNITY): Payer: Self-pay | Admitting: *Deleted

## 2021-07-06 NOTE — Telephone Encounter (Signed)
[facility] has Approved ARIPiprazole (ABILIFY) 5 MG tablet a quantity of 90 for 90 days has been approved for  12 months from 07/05/2021 to  07/05/2022

## 2021-07-06 NOTE — Telephone Encounter (Signed)
[facility] has Approved ARIPiprazole (ABILIFY) 5 MG tablet a quantity of 90 for 90 days has been approved for  12 months from 07/05/2021 to  07/05/2022 

## 2021-08-09 ENCOUNTER — Telehealth (INDEPENDENT_AMBULATORY_CARE_PROVIDER_SITE_OTHER): Payer: Medicaid Other | Admitting: Psychiatry

## 2021-08-09 ENCOUNTER — Other Ambulatory Visit: Payer: Self-pay

## 2021-08-09 ENCOUNTER — Encounter (HOSPITAL_COMMUNITY): Payer: Self-pay | Admitting: Psychiatry

## 2021-08-09 DIAGNOSIS — F411 Generalized anxiety disorder: Secondary | ICD-10-CM | POA: Diagnosis not present

## 2021-08-09 DIAGNOSIS — F3110 Bipolar disorder, current episode manic without psychotic features, unspecified: Secondary | ICD-10-CM

## 2021-08-09 MED ORDER — ARIPIPRAZOLE 5 MG PO TABS: 5.0000 mg | ORAL_TABLET | Freq: Every day | ORAL | 2 refills | Status: DC

## 2021-08-09 MED ORDER — TRAZODONE HCL 100 MG PO TABS: 100.0000 mg | ORAL_TABLET | Freq: Every day | ORAL | 2 refills | Status: DC

## 2021-08-09 MED ORDER — HYDROXYZINE HCL 25 MG PO TABS: 25.0000 mg | ORAL_TABLET | Freq: Three times a day (TID) | ORAL | 2 refills | Status: DC | PRN

## 2021-08-09 MED ORDER — LAMOTRIGINE 25 MG PO TABS: 25.0000 mg | ORAL_TABLET | Freq: Every day | ORAL | 2 refills | Status: DC

## 2021-08-09 NOTE — Progress Notes (Signed)
BH MD/PA/NP OP Progress Note Virtual Visit via Video Note  I connected with Ellen Huffman on 08/09/21 at 10:00 AM EDT by a video enabled telemedicine application and verified that I am speaking with the correct person using two identifiers.  Location: Patient: Home Provider: Clinic   I discussed the limitations of evaluation and management by telemedicine and the availability of in person appointments. The patient expressed understanding and agreed to proceed.  I provided 30 minutes of non-face-to-face time during this encounter.   08/09/2021 10:49 AM Ellen Huffman  MRN:  [id number]  Chief Complaint: "I am doing better"  HPI: 20 year old female seen today for follow up psychiatric evaluation.  She has a psychiatric history of anxiety, depression, SI/SA, opioid use disorder, and self injures behaviors. She is managed on Lamictal 50 mg daily, hydroxyzine 25 mg 3 times daily as needed, and trazodone 100 mg nightly as needed.  She notes her medications are somewhat effective in managing her psychiatric conditions.  He informed Clinical research associate that she did not increase her Lamictal and has only been taking 25 mg.  Today patient was logged in virtually however her camera was turned off.  She was pleasant, cooperative, and engaged in conversation.  She informed Clinical research associate that she has feeling better since her last visit.  She notes that she is less anxious and depressed.  Provider conducted a GAD-7 and patient scored a 10, at her last visit she scored a 21.  Provider also conducted a PHQ-9 and patient scored a 13, at her last visit she scored a 21.  She notes that she still has fluctuations in her mood, irritability, distractibility, and racing thoughts.  She notes that she no longer impulsively spends her money.  Today she denies SI/HI/VAH or paranoia.  Patient notes that her sleep has improved noting that she sleeps all night.  She informed Clinical research associate that since her last visit she has gained approximately 5 pounds which  she attributes to working at [employer].  She notes that she has been sober from benzodiazepines and illegal substances for over 2 months.  She notes that she continues to work at [employer].  She notes that she finds enjoyment in her job.   Provider recommended increasing Lamictal to 50 mg for 2 weeks and then increasing it to 75 mg to help mood stabilization.  Patient was agreeable to this request.  She will continue all other medications as prescribed.  No other concerns noted at this time.       Visit Diagnosis:    ICD-10-CM   1. Bipolar I disorder, most recent episode (or current) manic (HCC)  F31.10 ARIPiprazole (ABILIFY) 5 MG tablet    lamoTRIgine (LAMICTAL) 25 MG tablet    traZODone (DESYREL) 100 MG tablet    2. Generalized anxiety disorder  F41.1 hydrOXYzine (ATARAX/VISTARIL) 25 MG tablet    traZODone (DESYREL) 100 MG tablet      Past Psychiatric History:  Depression, Anxiety, SI/SA, opoid use, self injurious behavior (cutting)  Past Medical History:  Past Medical History:  Diagnosis Date   Allergy    Depression    Mental disorder    Overdose Jan 2015   History reviewed. No pertinent surgical history.  Family Psychiatric History: Mother depression, notes one of her maternal cousin has mental health conditions but unaware of which one.   Family History:  Family History  Problem Relation Age of Onset   Migraines Neg Hx    Parkinsonism Neg Hx    Seizures  Neg Hx    ADD / ADHD Neg Hx    Bipolar disorder Neg Hx     Social History:  Social History   Socioeconomic History   Marital status: Single    Spouse name: Not on file   Number of children: Not on file   Years of education: Not on file   Highest education level: Not on file  Occupational History   Not on file  Tobacco Use   Smoking status: Never   Smokeless tobacco: Never  Vaping Use   Vaping Use: Every day  Substance and Sexual Activity   Alcohol use: No   Drug use: Yes    Types:  Marijuana    Comment: percocet   Sexual activity: Not Currently    Birth control/protection: None  Other Topics Concern   Not on file  Social History Narrative   Grade:11   School Name:[employer]   How does patient do in school: average   Patient lives with: Dad- his girlfriend and her daughter- mom with her at visit   Does patient have and IEP/504 Plan in school? No      What are the patient's hobbies or interest? Sleep- no hobbies   Social Determinants of Health   Financial Resource Strain: Not on file  Food Insecurity: Not on file  Transportation Needs: Not on file  Physical Activity: Not on file  Stress: Not on file  Social Connections: Not on file    Allergies:  Allergies  Allergen Reactions   Penicillins Rash    Metabolic Disorder Labs: Lab Results  Component Value Date   HGBA1C 5.2 12/13/2013   MPG 103 12/13/2013   No results found for: PROLACTIN Lab Results  Component Value Date   CHOL 160 12/13/2013   TRIG 49 12/13/2013   HDL 61 12/13/2013   CHOLHDL 2.6 12/13/2013   VLDL 10 12/13/2013   LDLCALC 89 12/13/2013   Lab Results  Component Value Date   TSH 1.767 12/13/2013   TSH 1.226 09/15/2013    Therapeutic Level Labs: No results found for: LITHIUM No results found for: VALPROATE No components found for:  CBMZ  Current Medications: Current Outpatient Medications  Medication Sig Dispense Refill   albuterol (VENTOLIN HFA) 108 (90 Base) MCG/ACT inhaler Inhale 2 puffs into the lungs every 6 (six) hours as needed for wheezing or shortness of breath. 8 g 2   ARIPiprazole (ABILIFY) 5 MG tablet Take 1 tablet (5 mg total) by mouth daily. 30 tablet 2   hydrOXYzine (ATARAX/VISTARIL) 25 MG tablet Take 1 tablet (25 mg total) by mouth 3 (three) times daily as needed. 90 tablet 2   lamoTRIgine (LAMICTAL) 25 MG tablet Take 1 tablet (25 mg total) by mouth daily. 90 tablet 2   naproxen (NAPROSYN) 500 MG tablet Take 1 tablet (500 mg total) by mouth 2 (two) times  daily. 30 tablet 0   traZODone (DESYREL) 100 MG tablet Take 1 tablet (100 mg total) by mouth at bedtime. 30 tablet 2   No current facility-administered medications for this visit.     Musculoskeletal: Strength & Muscle Tone:  Unable to assess patient's camera turned off Gait & Station:  Unable to assess patient's camera turned off Patient leans: N/A  Psychiatric Specialty Exam: Review of Systems  There were no vitals taken for this visit.There is no height or weight on file to calculate BMI.  General Appearance:  Unable to assess patient's camera turned off  Eye Contact:   Unable to  assess patient's camera turned off  Speech:  Clear and Coherent and Normal Rate  Volume:  Normal  Mood:  Anxious, Depressed, and notes that the symptoms have somewhat improved since her last visit  Affect:  Appropriate and Congruent  Thought Process:  Coherent, Goal Directed, and Linear  Orientation:  Full (Time, Place, and Person)  Thought Content: WDL and Logical   Suicidal Thoughts:  No  Homicidal Thoughts:  No  Memory:  Immediate;   Good Recent;   Good Remote;   Good  Judgement:  Good  Insight:  Good and Fair  Psychomotor Activity:   Unable to assess patient's camera turned off  Concentration:  Concentration: Good and Attention Span: Good  Recall:  Good  Fund of Knowledge: Good  Language: Good  Akathisia:   Unable to assess patient's camera turned off  Handed:  Right  AIMS (if indicated): not done  Assets:  Communication Skills Desire for Improvement Financial Resources/Insurance Housing Leisure Time Vocational/Educational  ADL's:  Intact  Cognition: WNL  Sleep:  Good   Screenings: GAD-7    Flowsheet Row Video Visit from 08/09/2021 in [facility] Video Visit from 06/30/2021 in [facility] Video Visit from 03/31/2021 in [facility]  Total GAD-7 Score 10 21 21       MDI    Flowsheet Row Office  Visit from 10/14/2013 in [facility] Counselor from 03/18/2013 in [facility] [place]  Total Score (max 50) 14 35      PHQ2-9    Flowsheet Row Video Visit from 08/09/2021 in [facility] Video Visit from 06/30/2021 in [facility] Video Visit from 03/31/2021 in [facility]  PHQ-2 Total Score 3 4 6   PHQ-9 Total Score 13 21 27       Flowsheet Row Video Visit from 08/09/2021 in [facility] Video Visit from 06/30/2021 in [facility] ED from 06/11/2021 in [facility]  C-SSRS RISK CATEGORY Error: Question 6 not populated Error: Q7 should not be populated when Q6 is No No Risk        Assessment and Plan: Patient notes that her anxiety, depression, and mood has improved since her last visit.  She does still endorse some symptoms of hypomania.  Today she is agreeable to increasing Lamictal 25 mg to 50 mg for 2 weeks and then increasing it to 75 mg.  She will continue her other medications as prescribed   1. Generalized anxiety disorder  Continue- hydrOXYzine (ATARAX/VISTARIL) 25 MG tablet; Take 1 tablet (25 mg total) by mouth 3 (three) times daily as needed.  Dispense: 90 tablet; Refill: 2 Continue- traZODone (DESYREL) 100 MG tablet; Take 1 tablet (100 mg total) by mouth at bedtime.  Dispense: 30 tablet; Refill: 2  2. Bipolar I disorder, most recent episode (or current) manic (HCC)  Continue- ARIPiprazole (ABILIFY) 5 MG tablet; Take 1 tablet (5 mg total) by mouth daily.  Dispense: 30 tablet; Refill: 2 Continue- traZODone (DESYREL) 100 MG tablet; Take 1 tablet (100 mg total) by mouth at bedtime.  Dispense: 30 tablet; Refill: 2 Increase- lamoTRIgine (LAMICTAL) 25 MG tablet; Take 1 tablet (25 mg total) by mouth daily.  Dispense: 90 tablet; Refill: 2   Follow-up in  3 month  08/31/2021, NP 08/09/2021, 10:49 AM

## 2021-08-12 ENCOUNTER — Ambulatory Visit: Payer: Self-pay | Admitting: Neurology

## 2021-08-28 ENCOUNTER — Ambulatory Visit
Admission: EM | Admit: 2021-08-28 | Discharge: 2021-08-28 | Disposition: A | Discharge status: Home / Self Care | Payer: Medicaid Other | Attending: Urgent Care | Admitting: Urgent Care

## 2021-08-28 ENCOUNTER — Other Ambulatory Visit: Payer: Self-pay

## 2021-08-28 ENCOUNTER — Encounter: Payer: Self-pay | Admitting: Emergency Medicine

## 2021-08-28 DIAGNOSIS — N898 Other specified noninflammatory disorders of vagina: Secondary | ICD-10-CM | POA: Diagnosis present

## 2021-08-28 DIAGNOSIS — Z7251 High risk heterosexual behavior: Secondary | ICD-10-CM

## 2021-08-28 LAB — POCT URINE PREGNANCY: Preg Test, Ur: NEGATIVE

## 2021-08-28 NOTE — ED Provider Notes (Signed)
[facility]   MRN: [id number] DOB: 10-Jan-2001  Subjective:   Ellen Huffman is a 20 y.o. female presenting for several day history of acute onset vaginal discharge, intermittent urinary frequency. Patient is sexually active, has 1 female partner, does not use condoms for protection. Was also sexually active with someone else prior to her current partner, no protection use with them either. No concern for HIV, syphilis. LMP was 08/07/2021, is regular. No concern for pregnancy, does not want testing for this.  Does not want to be checked for urinary tract infection either.  No current facility-administered medications for this encounter.  Current Outpatient Medications:    albuterol (VENTOLIN HFA) 108 (90 Base) MCG/ACT inhaler, Inhale 2 puffs into the lungs every 6 (six) hours as needed for wheezing or shortness of breath., Disp: 8 g, Rfl: 2   ARIPiprazole (ABILIFY) 5 MG tablet, Take 1 tablet (5 mg total) by mouth daily., Disp: 30 tablet, Rfl: 2   hydrOXYzine (ATARAX/VISTARIL) 25 MG tablet, Take 1 tablet (25 mg total) by mouth 3 (three) times daily as needed., Disp: 90 tablet, Rfl: 2   lamoTRIgine (LAMICTAL) 25 MG tablet, Take 1 tablet (25 mg total) by mouth daily., Disp: 90 tablet, Rfl: 2   naproxen (NAPROSYN) 500 MG tablet, Take 1 tablet (500 mg total) by mouth 2 (two) times daily., Disp: 30 tablet, Rfl: 0   traZODone (DESYREL) 100 MG tablet, Take 1 tablet (100 mg total) by mouth at bedtime., Disp: 30 tablet, Rfl: 2   Allergies  Allergen Reactions   Penicillins Rash    Past Medical History:  Diagnosis Date   Allergy    Depression    Mental disorder    Overdose Jan 2015     History reviewed. No pertinent surgical history.  Family History  Problem Relation Age of Onset   Migraines Neg Hx    Parkinsonism Neg Hx    Seizures Neg Hx    ADD / ADHD Neg Hx    Bipolar disorder Neg Hx     Social History   Tobacco Use   Smoking status: Never   Smokeless tobacco: Never   Vaping Use   Vaping Use: Every day  Substance Use Topics   Alcohol use: No   Drug use: Yes    Types: Marijuana    Comment: percocet    ROS   Objective:   Vitals: BP (!) 111/58 (BP Location: Left Arm)   Pulse 77   Temp 98 F (36.7 C) (Oral)   Resp 18   SpO2 98%   Physical Exam Constitutional:      General: She is not in acute distress.    Appearance: Normal appearance. She is well-developed. She is not ill-appearing, toxic-appearing or diaphoretic.  HENT:     Head: Normocephalic and atraumatic.     Nose: Nose normal.     Mouth/Throat:     Mouth: Mucous membranes are moist.     Pharynx: Oropharynx is clear.  Eyes:     General: No scleral icterus.       Right eye: No discharge.        Left eye: No discharge.     Extraocular Movements: Extraocular movements intact.     Conjunctiva/sclera: Conjunctivae normal.     Pupils: Pupils are equal, round, and reactive to light.  Cardiovascular:     Rate and Rhythm: Normal rate.  Pulmonary:     Effort: Pulmonary effort is normal.  Abdominal:     General: Bowel sounds are  normal. There is no distension.     Palpations: Abdomen is soft. There is no mass.     Tenderness: no abdominal tenderness There is no right CVA tenderness, left CVA tenderness, guarding or rebound.  Skin:    General: Skin is warm and dry.  Neurological:     General: No focal deficit present.     Mental Status: She is alert and oriented to person, place, and time.  Psychiatric:        Mood and Affect: Mood normal.        Behavior: Behavior normal.        Thought Content: Thought content normal.        Judgment: Judgment normal.    Assessment and Plan :   PDMP not reviewed this encounter.  1. Vaginal discharge   2. Unprotected sex     Patient declined HIV, syphilis testing.  She also declined urine pregnancy test, urinalysis.  Cervical swab results pending.  Recommended waiting for results to treat based off of this.  She is agreeable.  Hydrate  well, avoid urinary irritants.   Wallis Bamberg, PA-C 08/28/21 1008

## 2021-08-28 NOTE — ED Triage Notes (Signed)
Pt here for STD testing; pt denies sx 

## 2021-08-30 LAB — CERVICOVAGINAL ANCILLARY ONLY
Bacterial Vaginitis (gardnerella): POSITIVE — AB
Candida Glabrata: NEGATIVE
Candida Vaginitis: NEGATIVE
Chlamydia: NEGATIVE
Comment: NEGATIVE
Comment: NEGATIVE
Comment: NEGATIVE
Comment: NEGATIVE
Comment: NEGATIVE
Comment: NORMAL
Neisseria Gonorrhea: NEGATIVE
Trichomonas: NEGATIVE

## 2021-09-02 ENCOUNTER — Telehealth (HOSPITAL_COMMUNITY): Payer: Self-pay

## 2021-09-02 MED ORDER — METRONIDAZOLE 500 MG PO TABS: 500.0000 mg | ORAL_TABLET | Freq: Two times a day (BID) | ORAL | 0 refills | Status: DC

## 2021-09-20 ENCOUNTER — Ambulatory Visit
Admission: EM | Admit: 2021-09-20 | Discharge: 2021-09-20 | Disposition: A | Discharge status: Home / Self Care | Payer: Medicaid Other | Attending: Urgent Care | Admitting: Urgent Care

## 2021-09-20 ENCOUNTER — Other Ambulatory Visit: Payer: Self-pay

## 2021-09-20 ENCOUNTER — Encounter: Payer: Self-pay | Admitting: Emergency Medicine

## 2021-09-20 DIAGNOSIS — Z3491 Encounter for supervision of normal pregnancy, unspecified, first trimester: Secondary | ICD-10-CM

## 2021-09-20 DIAGNOSIS — R102 Pelvic and perineal pain: Secondary | ICD-10-CM

## 2021-09-20 DIAGNOSIS — O99891 Other specified diseases and conditions complicating pregnancy: Secondary | ICD-10-CM | POA: Diagnosis not present

## 2021-09-20 DIAGNOSIS — Z3A01 Less than 8 weeks gestation of pregnancy: Secondary | ICD-10-CM | POA: Diagnosis not present

## 2021-09-20 LAB — POCT URINE PREGNANCY: Preg Test, Ur: POSITIVE — AB

## 2021-09-20 NOTE — ED Triage Notes (Addendum)
3 weeks ago noticed lower abdominal cramping, was due for period 2 weeks ago and missed it, took pregnancy test yesterday which turned out positive. Still having lower abdominal cramping. Requesting repeat test.

## 2021-09-20 NOTE — ED Provider Notes (Signed)
[facility]   MRN: [id number] DOB: 2001-07-08  Subjective:   Ellen Huffman is a 20 y.o. female presenting for 2 week history of lower abdominal cramping, pelvic pains. She took a home pregnancy test and was positive. Was having vaginal bleeding during sex ~1 week ago and has had some occasional spotting since then.   No current facility-administered medications for this encounter.  Current Outpatient Medications:    albuterol (VENTOLIN HFA) 108 (90 Base) MCG/ACT inhaler, Inhale 2 puffs into the lungs every 6 (six) hours as needed for wheezing or shortness of breath., Disp: 8 g, Rfl: 2   ARIPiprazole (ABILIFY) 5 MG tablet, Take 1 tablet (5 mg total) by mouth daily., Disp: 30 tablet, Rfl: 2   hydrOXYzine (ATARAX/VISTARIL) 25 MG tablet, Take 1 tablet (25 mg total) by mouth 3 (three) times daily as needed., Disp: 90 tablet, Rfl: 2   lamoTRIgine (LAMICTAL) 25 MG tablet, Take 1 tablet (25 mg total) by mouth daily., Disp: 90 tablet, Rfl: 2   metroNIDAZOLE (FLAGYL) 500 MG tablet, Take 1 tablet (500 mg total) by mouth 2 (two) times daily., Disp: 14 tablet, Rfl: 0   naproxen (NAPROSYN) 500 MG tablet, Take 1 tablet (500 mg total) by mouth 2 (two) times daily., Disp: 30 tablet, Rfl: 0   traZODone (DESYREL) 100 MG tablet, Take 1 tablet (100 mg total) by mouth at bedtime., Disp: 30 tablet, Rfl: 2   Allergies  Allergen Reactions   Penicillins Rash    Past Medical History:  Diagnosis Date   Allergy    Depression    Mental disorder    Overdose Jan 2015     History reviewed. No pertinent surgical history.  Family History  Problem Relation Age of Onset   Migraines Neg Hx    Parkinsonism Neg Hx    Seizures Neg Hx    ADD / ADHD Neg Hx    Bipolar disorder Neg Hx     Social History   Tobacco Use   Smoking status: Never   Smokeless tobacco: Never  Vaping Use   Vaping Use: Every day  Substance Use Topics   Alcohol use: No   Drug use: Yes    Types: Marijuana    Comment:  percocet    ROS   Objective:   Vitals: BP 120/72 (BP Location: Left Arm)   Pulse 82   Temp 98.3 F (36.8 C) (Oral)   Resp 16   SpO2 99%   Physical Exam Constitutional:      General: She is not in acute distress.    Appearance: Normal appearance. She is well-developed. She is not ill-appearing, toxic-appearing or diaphoretic.  HENT:     Head: Normocephalic and atraumatic.     Nose: Nose normal.     Mouth/Throat:     Mouth: Mucous membranes are moist.     Pharynx: Oropharynx is clear.  Eyes:     General: No scleral icterus.       Right eye: No discharge.        Left eye: No discharge.     Extraocular Movements: Extraocular movements intact.     Conjunctiva/sclera: Conjunctivae normal.     Pupils: Pupils are equal, round, and reactive to light.  Cardiovascular:     Rate and Rhythm: Normal rate.  Pulmonary:     Effort: Pulmonary effort is normal.  Abdominal:     General: Bowel sounds are normal. There is no distension.     Palpations: Abdomen is soft. There is no  mass.     Tenderness: There is no abdominal tenderness. There is no guarding or rebound.  Skin:    General: Skin is warm and dry.  Neurological:     General: No focal deficit present.     Mental Status: She is alert and oriented to person, place, and time.  Psychiatric:        Mood and Affect: Mood normal.        Behavior: Behavior normal.        Thought Content: Thought content normal.        Judgment: Judgment normal.    Results for orders placed or performed during the hospital encounter of 09/20/21 (from the past 24 hour(s))  POCT urine pregnancy     Status: Abnormal   Collection Time: 09/20/21  2:26 PM  Result Value Ref Range   Preg Test, Ur Positive (A) Negative    Assessment and Plan :   PDMP not reviewed this encounter.  1. Pelvic cramping   2. First trimester pregnancy     Discussed possibility of an ectopic pregnancy.  But as patient has very mild intermittent cramping, no tenderness  on exam will defer a visit to the [facility].  Recommended establishing care soon as possible with an obstetrician to discuss her options as this is not a planned pregnancy.  General counseling provided including avoidance of alcohol and cigarettes, caffeine.  Counseled on medications generally safe in pregnancy.  Counseled patient on potential for adverse effects with medications prescribed/recommended today, ER and return-to-clinic precautions discussed, patient verbalized understanding.    Wallis Bamberg, [place] 09/20/21 1533

## 2021-09-23 ENCOUNTER — Inpatient Hospital Stay (HOSPITAL_COMMUNITY)
Admission: AD | Admit: 2021-09-23 | Discharge: 2021-09-23 | Disposition: A | Discharge status: Home / Self Care | Payer: Medicaid Other | Attending: Obstetrics & Gynecology | Admitting: Obstetrics & Gynecology

## 2021-09-23 ENCOUNTER — Inpatient Hospital Stay (HOSPITAL_COMMUNITY): Payer: Medicaid Other

## 2021-09-23 ENCOUNTER — Encounter (HOSPITAL_COMMUNITY): Payer: Self-pay

## 2021-09-23 ENCOUNTER — Other Ambulatory Visit: Payer: Self-pay

## 2021-09-23 DIAGNOSIS — O3401 Maternal care for unspecified congenital malformation of uterus, first trimester: Secondary | ICD-10-CM | POA: Diagnosis not present

## 2021-09-23 DIAGNOSIS — R102 Pelvic and perineal pain: Secondary | ICD-10-CM

## 2021-09-23 DIAGNOSIS — O3680X0 Pregnancy with inconclusive fetal viability, not applicable or unspecified: Secondary | ICD-10-CM | POA: Insufficient documentation

## 2021-09-23 DIAGNOSIS — Z3A08 8 weeks gestation of pregnancy: Secondary | ICD-10-CM | POA: Diagnosis not present

## 2021-09-23 DIAGNOSIS — O26891 Other specified pregnancy related conditions, first trimester: Secondary | ICD-10-CM | POA: Diagnosis not present

## 2021-09-23 DIAGNOSIS — Q513 Bicornate uterus: Secondary | ICD-10-CM | POA: Insufficient documentation

## 2021-09-23 DIAGNOSIS — R109 Unspecified abdominal pain: Secondary | ICD-10-CM | POA: Insufficient documentation

## 2021-09-23 DIAGNOSIS — O26899 Other specified pregnancy related conditions, unspecified trimester: Secondary | ICD-10-CM

## 2021-09-23 LAB — URINALYSIS, ROUTINE W REFLEX MICROSCOPIC
Bilirubin Urine: NEGATIVE
Glucose, UA: NEGATIVE mg/dL
Hgb urine dipstick: NEGATIVE
Ketones, ur: NEGATIVE mg/dL
Leukocytes,Ua: NEGATIVE
Nitrite: NEGATIVE
Protein, ur: NEGATIVE mg/dL
Specific Gravity, Urine: 1.025 (ref 1.005–1.030)
pH: 8 (ref 5.0–8.0)

## 2021-09-23 LAB — HCG, QUANTITATIVE, PREGNANCY: hCG, Beta Chain, Quant, S: 26592 m[IU]/mL — ABNORMAL HIGH (ref <5)

## 2021-09-23 LAB — WET PREP, GENITAL
Sperm: NONE SEEN
Trich, Wet Prep: NONE SEEN
WBC, Wet Prep HPF POC: NONE SEEN
Yeast Wet Prep HPF POC: NONE SEEN

## 2021-09-23 LAB — CBC
HCT: 39.8 % (ref 36.0–46.0)
Hemoglobin: 13.6 g/dL (ref 12.0–15.0)
MCH: 31.3 pg (ref 26.0–34.0)
MCHC: 34.2 g/dL (ref 30.0–36.0)
MCV: 91.5 fL (ref 80.0–100.0)
Platelets: 250 10*3/uL (ref 150–400)
RBC: 4.35 MIL/uL (ref 3.87–5.11)
RDW: 13.8 % (ref 11.5–15.5)
WBC: 6 10*3/uL (ref 4.0–10.5)
nRBC: 0 % (ref 0.0–0.2)

## 2021-09-23 LAB — ABO/RH: ABO/RH(D): A POS

## 2021-09-23 NOTE — MAU Provider Note (Signed)
Event Date/Time   First Provider Initiated Contact with Patient 09/23/21 1426      S Ms. Ronnika Blondin is a 20 y.o. G1P0 patient who presents to MAU today with complaint of intermittent abdominal pain for the past few weeks and spotting today. She describes the pain as being "like a period"; rating it a 4-5/10. She also reports having VB during SI about 1.5 weeks ago, but none after. She reports her LMP as being 08/07/2021.  O BP 125/79 (BP Location: Right Arm)   Pulse 81   Temp 98.9 F (37.2 C) (Oral)   Resp 16   Ht 5\' 6"  (1.676 m)   Wt 71.8 kg   LMP 08/07/2021   SpO2 100% Comment: room air  BMI 25.53 kg/m  Physical Exam Vitals and nursing note reviewed.  Constitutional:      Appearance: Normal appearance. She is normal weight.  Pulmonary:     Effort: Pulmonary effort is normal.  Genitourinary:    Comments: Self-swab done by patient Neurological:     Mental Status: She is alert and oriented to person, place, and time.  Psychiatric:        Mood and Affect: Mood normal.        Behavior: Behavior normal.        Thought Content: Thought content normal.        Judgment: Judgment normal.    Results for orders placed or performed during the hospital encounter of 09/23/21 (from the past 24 hour(s))  CBC     Status: None   Collection Time: 09/23/21 12:37 PM  Result Value Ref Range   WBC 6.0 4.0 - 10.5 K/uL   RBC 4.35 3.87 - 5.11 MIL/uL   Hemoglobin 13.6 12.0 - 15.0 g/dL   HCT 09/25/21 74.0 - 81.4 %   MCV 91.5 80.0 - 100.0 fL   MCH 31.3 26.0 - 34.0 pg   MCHC 34.2 30.0 - 36.0 g/dL   RDW 48.1 85.6 - 31.4 %   Platelets 250 150 - 400 K/uL   nRBC 0.0 0.0 - 0.2 %  hCG, quantitative, pregnancy     Status: Abnormal   Collection Time: 09/23/21 12:37 PM  Result Value Ref Range   hCG, Beta Chain, Quant, S 26,592 (H) <5 mIU/mL  ABO/Rh     Status: None   Collection Time: 09/23/21 12:37 PM  Result Value Ref Range   ABO/RH(D) A POS    No rh immune globuloin      NOT A RH IMMUNE GLOBULIN  CANDIDATE, PT RH POSITIVE Performed at [facility], [address], [place], [place]   Urinalysis, Routine w reflex microscopic Urine, Clean Catch     Status: None   Collection Time: 09/23/21 12:45 PM  Result Value Ref Range   Color, Urine YELLOW YELLOW   APPearance CLEAR CLEAR   Specific Gravity, Urine 1.025 1.005 - 1.030   pH 8.0 5.0 - 8.0   Glucose, UA NEGATIVE NEGATIVE mg/dL   Hgb urine dipstick NEGATIVE NEGATIVE   Bilirubin Urine NEGATIVE NEGATIVE   Ketones, ur NEGATIVE NEGATIVE mg/dL   Protein, ur NEGATIVE NEGATIVE mg/dL   Nitrite NEGATIVE NEGATIVE   Leukocytes,Ua NEGATIVE NEGATIVE  Wet prep, genital     Status: Abnormal   Collection Time: 09/23/21 12:56 PM  Result Value Ref Range   Yeast Wet Prep HPF POC NONE SEEN NONE SEEN   Trich, Wet Prep NONE SEEN NONE SEEN   Clue Cells Wet Prep HPF POC PRESENT (  A) NONE SEEN   WBC, Wet Prep HPF POC NONE SEEN NONE SEEN   Sperm NONE SEEN     US OB LESS THAN 14 WEEKS WITH OB TRANSVAGINAL  Result Date: 09/23/2021 CLINICAL DATA:  Abdominal pain for 2 weeks in first trimester of pregnancy, LMP 08/07/2021 EXAM: OBSTETRIC <14 WK [place] AND TRANSVAGINAL OB US TECHNIQUE: Both transabdominal and transvaginal ultrasound examinations were performed for complete evaluation of the gestation as well as the maternal uterus, adnexal regions, and pelvic cul-de-sac. Transvaginal technique was performed to assess early pregnancy. COMPARISON:  None for this gestation FINDINGS: Intrauterine gestational sac: Absent Yolk sac:  N/A Embryo:  N/A Cardiac Activity: N/A Heart Rate: N/A  bpm MSD:   mm    w     d CRL:    mm    w    d                  [place] EDC: N/A Subchorionic hemorrhage:  None visualized. 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 Small amount of complex hypoechoic fluid question blood within LEFT endometrial canal. No gestational sac identified. LEFT ovary normal size and morphology 2.4  x 1.5 x 2.1 cm. RIGHT ovary normal size and morphology, 2.5 x 2.9 x 2.2 cm. Trace free pelvic fluid. No adnexal masses. 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 No definite intrauterine gestation identified. Findings are consistent with pregnancy of unknown location. Differential diagnosis includes early intrauterine pregnancy too early to visualize, spontaneous abortion, and ectopic pregnancy. Serial quantitative beta HCG and or follow-up ultrasound recommended to definitively exclude ectopic pregnancy. Electronically Signed   By: Ulyses Southward M.D.   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 MTX) Warning signs for worsening condition that would warrant emergency follow-up discussed Patient may return to MAU as needed   Raelyn Mora, CNM 09/23/2021 2:32 PM

## 2021-09-23 NOTE — MAU Note (Signed)
Ellen Huffman is a 20 y.o. at [redacted]w[redacted]d here in MAU reporting: has had abdominal pain for the past few weeks, states it is intermittent. Started spotting today.  LMP: 08/07/2021  Onset of complaint: ongoing  Pain score: 0/10  Vitals:   09/23/21 1251  BP: 125/79  Pulse: 81  Resp: 16  Temp: 98.9 F (37.2 C)  SpO2: 100%     Lab orders placed from triage: UA, labs entered by provider

## 2021-09-24 ENCOUNTER — Other Ambulatory Visit: Payer: Self-pay

## 2021-09-24 ENCOUNTER — Inpatient Hospital Stay (HOSPITAL_COMMUNITY)
Admission: EM | Admit: 2021-09-24 | Discharge: 2021-09-25 | Disposition: A | Discharge status: Home / Self Care | Payer: Medicaid Other | Attending: Obstetrics and Gynecology | Admitting: Obstetrics and Gynecology

## 2021-09-24 DIAGNOSIS — R109 Unspecified abdominal pain: Secondary | ICD-10-CM | POA: Insufficient documentation

## 2021-09-24 DIAGNOSIS — O26899 Other specified pregnancy related conditions, unspecified trimester: Secondary | ICD-10-CM

## 2021-09-24 DIAGNOSIS — Z3A01 Less than 8 weeks gestation of pregnancy: Secondary | ICD-10-CM | POA: Insufficient documentation

## 2021-09-24 DIAGNOSIS — O26891 Other specified pregnancy related conditions, first trimester: Secondary | ICD-10-CM | POA: Insufficient documentation

## 2021-09-24 DIAGNOSIS — Z880 Allergy status to penicillin: Secondary | ICD-10-CM | POA: Insufficient documentation

## 2021-09-24 DIAGNOSIS — O3680X0 Pregnancy with inconclusive fetal viability, not applicable or unspecified: Secondary | ICD-10-CM

## 2021-09-24 DIAGNOSIS — O209 Hemorrhage in early pregnancy, unspecified: Secondary | ICD-10-CM | POA: Insufficient documentation

## 2021-09-24 DIAGNOSIS — O26859 Spotting complicating pregnancy, unspecified trimester: Secondary | ICD-10-CM

## 2021-09-25 ENCOUNTER — Encounter (HOSPITAL_COMMUNITY): Payer: Self-pay | Admitting: *Deleted

## 2021-09-25 ENCOUNTER — Other Ambulatory Visit: Payer: Self-pay

## 2021-09-25 ENCOUNTER — Inpatient Hospital Stay (HOSPITAL_COMMUNITY): Payer: Medicaid Other

## 2021-09-25 DIAGNOSIS — Z3A01 Less than 8 weeks gestation of pregnancy: Secondary | ICD-10-CM | POA: Diagnosis not present

## 2021-09-25 DIAGNOSIS — Z880 Allergy status to penicillin: Secondary | ICD-10-CM | POA: Diagnosis not present

## 2021-09-25 DIAGNOSIS — O26891 Other specified pregnancy related conditions, first trimester: Secondary | ICD-10-CM | POA: Diagnosis not present

## 2021-09-25 DIAGNOSIS — O209 Hemorrhage in early pregnancy, unspecified: Secondary | ICD-10-CM | POA: Diagnosis present

## 2021-09-25 DIAGNOSIS — R109 Unspecified abdominal pain: Secondary | ICD-10-CM | POA: Diagnosis not present

## 2021-09-25 LAB — HCG, QUANTITATIVE, PREGNANCY: hCG, Beta Chain, Quant, S: 27259 m[IU]/mL — ABNORMAL HIGH (ref <5)

## 2021-09-25 NOTE — ED Notes (Signed)
The pt is cleared to go to mau  call made  report given

## 2021-09-25 NOTE — ED Triage Notes (Signed)
The pt Ellen Huffman was aug 16th she has had some spotting  and earkier she used the br  and had some blood clots in the toilet   pain increasing from earlier today

## 2021-09-25 NOTE — MAU Provider Note (Signed)
History     CSN: [id number]  Arrival date and time: 09/24/21 2215   Event Date/Time   First Provider Initiated Contact with Patient 09/25/21 0103      Chief Complaint  Patient presents with   Vaginal Bleeding   HPI  Ms.Lorali Atilano is a 20 y.o. female G1P0 @ unknown gestation here with vaginal bleeding. The bleeding started just a few hours ago. The bleeding is heavy with clots. She reports the bleeding now is light, and she notices it only when she wipes. She also reports pain in her lower abdomen. The pain is constant. She did not take anything for the pain. She was seen on 9/30 in MAU with spotting and pain. She was scheduled to come back tomorrow for a follow up Hcg level however came earlier given the change in her symptoms.   [place] 9/30: Small amount of complex hypoechoic fluid question blood within LEFT endometrial canal.  OB History     Gravida  1   Para      Term      Preterm      AB      Living         SAB      IAB      Ectopic      Multiple      Live Births              Past Medical History:  Diagnosis Date   Allergy    Depression    Mental disorder    Overdose Jan 2015    History reviewed. No pertinent surgical history.  Family History  Problem Relation Age of Onset   Migraines Neg Hx    Parkinsonism Neg Hx    Seizures Neg Hx    ADD / ADHD Neg Hx    Bipolar disorder Neg Hx     Social History   Tobacco Use   Smoking status: Never   Smokeless tobacco: Never  Vaping Use   Vaping Use: Every day  Substance Use Topics   Alcohol use: No   Drug use: Yes    Types: Marijuana    Comment: percocet    Allergies:  Allergies  Allergen Reactions   Penicillins Rash    Medications Prior to Admission  Medication Sig Dispense Refill Last Dose   albuterol (VENTOLIN HFA) 108 (90 Base) MCG/ACT inhaler Inhale 2 puffs into the lungs every 6 (six) hours as needed for wheezing or shortness of breath. 8 g 2    Results for orders placed or  performed during the hospital encounter of 09/24/21 (from the past 48 hour(s))  hCG, quantitative, pregnancy     Status: Abnormal   Collection Time: 09/25/21  1:04 AM  Result Value Ref Range   hCG, Beta Chain, Quant, S 27,259 (H) <5 mIU/mL    Comment:          GEST. AGE      CONC.  (mIU/mL)   <=1 WEEK        5 - 50     2 WEEKS       50 - 500     3 WEEKS       100 - 10,000     4 WEEKS     1,000 - 30,000     5 WEEKS     3,500 - 115,000   6-8 WEEKS     12,000 - 270,000    12 WEEKS     15,000 - 220,000  FEMALE AND NON-PREGNANT FEMALE:     LESS THAN 5 mIU/mL Performed at [facility], [address] [place], [place] [address]     US OB Transvaginal  Result Date: 09/25/2021 CLINICAL DATA:  Positive pregnancy test with pelvic pain and vaginal bleeding EXAM: TRANSVAGINAL OB ULTRASOUND TECHNIQUE: Transvaginal ultrasound was performed for complete evaluation of the gestation as well as the maternal uterus, adnexal regions, and pelvic cul-de-sac. COMPARISON:  09/23/2021 FINDINGS: Intrauterine gestational sac: Absent Subchorionic hemorrhage:  None visualized. Maternal uterus/adnexae: Bicornuate uterus is again identified. Minimal endometrial fluid is noted in both horns. No gestational sac is identified. Small simple cyst is noted in the right adnexa. IMPRESSION: Absent gestational sac. Continued follow-up with beta HCG levels and ultrasound as necessary is recommended. Bicornuate uterus similar to that seen on the study 2 days previous. Electronically Signed   By: Alcide Clever M.D.   On: 09/25/2021 01:54   US OB LESS THAN 14 WEEKS WITH OB TRANSVAGINAL  Result Date: 09/23/2021 CLINICAL DATA:  Abdominal pain for 2 weeks in first trimester of pregnancy, LMP 08/07/2021 EXAM: OBSTETRIC <14 WK [place] AND TRANSVAGINAL OB US TECHNIQUE: Both transabdominal and transvaginal ultrasound examinations were performed for complete evaluation of the gestation as well as the maternal uterus, adnexal regions, and  pelvic cul-de-sac. Transvaginal technique was performed to assess early pregnancy. COMPARISON:  None for this gestation FINDINGS: Intrauterine gestational sac: Absent Yolk sac:  N/A Embryo:  N/A Cardiac Activity: N/A Heart Rate: N/A  bpm MSD:   mm    w     d CRL:    mm    w    d                  [place] EDC: N/A Subchorionic hemorrhage:  None visualized. 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 Small amount of complex hypoechoic fluid question blood within LEFT endometrial canal. No gestational sac identified. LEFT ovary normal size and morphology 2.4 x 1.5 x 2.1 cm. RIGHT ovary normal size and morphology, 2.5 x 2.9 x 2.2 cm. Trace free pelvic fluid. No adnexal masses. 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 No definite intrauterine gestation identified. Findings are consistent with pregnancy of unknown location. Differential diagnosis includes early intrauterine pregnancy too early to visualize, spontaneous abortion, and ectopic pregnancy. Serial quantitative beta HCG and or follow-up ultrasound recommended to definitively exclude ectopic pregnancy. Electronically Signed   By: Ulyses Southward M.D.   On: 09/23/2021 14:05     Review of Systems  Constitutional:  Negative for fever.  Gastrointestinal:  Positive for abdominal pain.  Genitourinary:  Positive for vaginal bleeding.  Physical Exam   Blood pressure (!) 107/58, pulse 83, temperature 98.6 F (37 C), resp. rate 16, height 5\' 6"  (1.676 m), weight 71.8 kg, last menstrual period 08/07/2021, SpO2 100 %.  Physical Exam Constitutional:      General: She is not in acute distress.    Appearance: Normal appearance. She is not ill-appearing, toxic-appearing or diaphoretic.  HENT:     Head: Normocephalic.  Abdominal:     Tenderness: There is generalized abdominal tenderness. There is no guarding or rebound.  Genitourinary:    Comments:  Bimanual exam done.  Cervix closed, posterior.  Minimal dark pink blood noted on exam glove Exam by: 08/09/2021, NP  Skin:    General: Skin is warm.  Neurological:     Mental Status: She  is alert and oriented to person, place, and time.  Psychiatric:        Behavior: Behavior normal.    MAU Course  Procedures  MDM  A positive blood type.  [place] today shows minimal endometrial fluid noted in both horns which is slightly different than [place] on 9/30. Reviewed both Ultrasounds and labs with Dr. Alysia Penna who recommends serial quants with the possibility of patient aborting pregnancy. She is stable at this time with minimal abdominal pain on exam. She is present today with her mother who is agreeable to the plan of care. We reviewed strict return precautions with any changes in her pain status.    Assessment and Plan   A:  Pregnancy of unknown anatomic location - Plan: Discharge patient  Abdominal pain affecting pregnancy - Plan: US OB Transvaginal, US OB Transvaginal, Discharge patient  Spotting in pregnancy - Plan: Discharge patient   P:  Discharge home with strict return precautions Pelvic rest Return to MAU on Tuesday for Quant. (MAU for location given the possibility for needed intervention) Ectopic precautions Ok to use tylenol as directed on the bottle.  Venia Carbon I, NP 09/25/2021 2:52 AM

## 2021-09-25 NOTE — ED Provider Notes (Signed)
Emergency Medicine Provider OB Triage Evaluation Note  Ellen Huffman is a 20 y.o. female, G1P0, at [redacted]w[redacted]d gestation who presents to the emergency department with complaints of increased suprapubic cramping and vaginal bleeding.  Was evaluated at MAU yesterday with pregnancy of unknown location.  Review of  Systems  Positive: vaginal bleeding Negative: syncope, lightheadedness, SOB, fevers  Physical Exam  BP 112/66   Pulse 76   Temp 98.9 F (37.2 C) (Oral)   Resp 16   Ht 5\' 6"  (1.676 m)   Wt 71.8 kg   LMP 08/07/2021   SpO2 100%   BMI 25.55 kg/m  General: Awake, no distress  HEENT: Atraumatic  Resp: Normal effort  Cardiac: Normal rate Abd: Nondistended, nontender  MSK: Moves all extremities without difficulty Neuro: Speech clear  Medical Decision Making  Pt evaluated for pregnancy concern and is stable for transfer to MAU. Pt is in agreement with plan for transfer.  12:09 AM Discussed with MAU APP who accepts patient in transfer.  Clinical Impression   1. Pregnancy of unknown anatomic location        08/09/2021, Antony Madura 09/25/21 0010    11/25/21, MD 09/26/21 [phone]

## 2021-09-25 NOTE — MAU Note (Signed)
Pt reports she was here yesterday and had an ultrasound and got some blood work done.   Pt report she was told she had a pregnancy of unknown location.   Pt reports going to the bathroom earlier today and had blood clots and abdominal cramping.

## 2021-09-26 LAB — GC/CHLAMYDIA PROBE AMP (~~LOC~~) NOT AT ARMC
Chlamydia: NEGATIVE
Comment: NEGATIVE
Comment: NORMAL
Neisseria Gonorrhea: NEGATIVE

## 2021-09-27 ENCOUNTER — Observation Stay (HOSPITAL_COMMUNITY): Payer: Medicaid Other

## 2021-09-27 ENCOUNTER — Observation Stay (HOSPITAL_COMMUNITY)
Admission: AD | Admit: 2021-09-27 | Discharge: 2021-09-27 | Disposition: A | Discharge status: Home / Self Care | Payer: Medicaid Other | Attending: Obstetrics and Gynecology | Admitting: Obstetrics and Gynecology

## 2021-09-27 ENCOUNTER — Encounter (HOSPITAL_COMMUNITY)
Admission: AD | Disposition: A | Discharge status: Home / Self Care | Payer: Self-pay | Source: Home / Self Care | Attending: Obstetrics and Gynecology

## 2021-09-27 ENCOUNTER — Observation Stay (HOSPITAL_COMMUNITY): Payer: Medicaid Other | Admitting: Certified Registered Nurse Anesthetist

## 2021-09-27 ENCOUNTER — Encounter (HOSPITAL_COMMUNITY): Payer: Self-pay | Admitting: Obstetrics and Gynecology

## 2021-09-27 ENCOUNTER — Other Ambulatory Visit: Payer: Self-pay

## 2021-09-27 ENCOUNTER — Inpatient Hospital Stay (HOSPITAL_COMMUNITY): Payer: Medicaid Other

## 2021-09-27 DIAGNOSIS — O0001 Abdominal pregnancy with intrauterine pregnancy: Principal | ICD-10-CM | POA: Insufficient documentation

## 2021-09-27 DIAGNOSIS — O3680X0 Pregnancy with inconclusive fetal viability, not applicable or unspecified: Secondary | ICD-10-CM

## 2021-09-27 DIAGNOSIS — O2690 Pregnancy related conditions, unspecified, unspecified trimester: Secondary | ICD-10-CM

## 2021-09-27 DIAGNOSIS — Z9889 Other specified postprocedural states: Secondary | ICD-10-CM

## 2021-09-27 DIAGNOSIS — O0281 Inappropriate change in quantitative human chorionic gonadotropin (hCG) in early pregnancy: Secondary | ICD-10-CM

## 2021-09-27 HISTORY — DX: Bipolar disorder, unspecified: F31.9

## 2021-09-27 HISTORY — DX: Anxiety disorder, unspecified: F41.9

## 2021-09-27 HISTORY — PX: DILATION AND CURETTAGE OF UTERUS: SHX78

## 2021-09-27 LAB — TYPE AND SCREEN
ABO/RH(D): A POS
Antibody Screen: NEGATIVE

## 2021-09-27 LAB — HCG, QUANTITATIVE, PREGNANCY: hCG, Beta Chain, Quant, S: 36949 m[IU]/mL — ABNORMAL HIGH (ref <5)

## 2021-09-27 SURGERY — DILATION AND CURETTAGE
Anesthesia: General

## 2021-09-27 MED ORDER — ONDANSETRON HCL 4 MG/2ML IJ SOLN
INTRAMUSCULAR | Status: DC | PRN
  Administered 2021-09-27: 4 mg via INTRAVENOUS

## 2021-09-27 MED ORDER — DEXAMETHASONE SODIUM PHOSPHATE 10 MG/ML IJ SOLN
INTRAMUSCULAR | Status: AC
  Filled 2021-09-27: qty 1

## 2021-09-27 MED ORDER — PROMETHAZINE HCL 25 MG/ML IJ SOLN: 6.2500 mg | INTRAMUSCULAR | Status: DC | PRN

## 2021-09-27 MED ORDER — FENTANYL CITRATE (PF) 100 MCG/2ML IJ SOLN
25.0000 ug | INTRAMUSCULAR | Status: DC | PRN
  Administered 2021-09-27: 50 ug via INTRAVENOUS
  Administered 2021-09-27 (×2): 25 ug via INTRAVENOUS
  Administered 2021-09-27: 50 ug via INTRAVENOUS

## 2021-09-27 MED ORDER — ONDANSETRON HCL 4 MG/2ML IJ SOLN
INTRAMUSCULAR | Status: AC
  Filled 2021-09-27: qty 2

## 2021-09-27 MED ORDER — FENTANYL CITRATE (PF) 250 MCG/5ML IJ SOLN
INTRAMUSCULAR | Status: AC
  Filled 2021-09-27: qty 5

## 2021-09-27 MED ORDER — PROPOFOL 10 MG/ML IV BOLUS
INTRAVENOUS | Status: DC | PRN
  Administered 2021-09-27: 200 mg via INTRAVENOUS

## 2021-09-27 MED ORDER — POVIDONE-IODINE 10 % EX SWAB
2.0000 "application " | Freq: Once | CUTANEOUS | Status: AC
  Administered 2021-09-27: 2 via TOPICAL

## 2021-09-27 MED ORDER — LACTATED RINGERS IV SOLN: INTRAVENOUS | Status: DC

## 2021-09-27 MED ORDER — FENTANYL CITRATE (PF) 100 MCG/2ML IJ SOLN
INTRAMUSCULAR | Status: DC | PRN
  Administered 2021-09-27: 100 ug via INTRAVENOUS
  Administered 2021-09-27 (×3): 50 ug via INTRAVENOUS

## 2021-09-27 MED ORDER — ACETAMINOPHEN 500 MG PO TABS: 500.0000 mg | ORAL_TABLET | Freq: Four times a day (QID) | ORAL | 0 refills | Status: DC | PRN

## 2021-09-27 MED ORDER — MIDAZOLAM HCL 2 MG/2ML IJ SOLN
INTRAMUSCULAR | Status: DC | PRN
  Administered 2021-09-27: 2 mg via INTRAVENOUS

## 2021-09-27 MED ORDER — LIDOCAINE HCL 1 % IJ SOLN
INTRAMUSCULAR | Status: AC
  Filled 2021-09-27: qty 20

## 2021-09-27 MED ORDER — CHLORHEXIDINE GLUCONATE 0.12 % MT SOLN
OROMUCOSAL | Status: AC
  Administered 2021-09-27: 15 mL via OROMUCOSAL
  Filled 2021-09-27: qty 15

## 2021-09-27 MED ORDER — OXYCODONE HCL 5 MG PO TABS: 5.0000 mg | ORAL_TABLET | Freq: Once | ORAL | Status: DC | PRN

## 2021-09-27 MED ORDER — SODIUM CHLORIDE 0.9 % IV SOLN
100.0000 mg | Freq: Two times a day (BID) | INTRAVENOUS | Status: DC
  Administered 2021-09-27: 100 mg via INTRAVENOUS
  Filled 2021-09-27 (×2): qty 100

## 2021-09-27 MED ORDER — LIDOCAINE 2% (20 MG/ML) 5 ML SYRINGE
INTRAMUSCULAR | Status: AC
  Filled 2021-09-27: qty 5

## 2021-09-27 MED ORDER — LIDOCAINE HCL 1 % IJ SOLN
INTRAMUSCULAR | Status: DC | PRN
  Administered 2021-09-27: 20 mL

## 2021-09-27 MED ORDER — LIDOCAINE 2% (20 MG/ML) 5 ML SYRINGE
INTRAMUSCULAR | Status: DC | PRN
Start: 2021-09-27 — End: 2021-09-27
  Administered 2021-09-27: 60 mg via INTRAVENOUS

## 2021-09-27 MED ORDER — FENTANYL CITRATE (PF) 100 MCG/2ML IJ SOLN
INTRAMUSCULAR | Status: AC
  Filled 2021-09-27: qty 2

## 2021-09-27 MED ORDER — OXYCODONE HCL 5 MG/5ML PO SOLN
5.0000 mg | Freq: Once | ORAL | Status: DC | PRN
Start: 2021-09-27 — End: 2021-09-28

## 2021-09-27 MED ORDER — MIDAZOLAM HCL 2 MG/2ML IJ SOLN
INTRAMUSCULAR | Status: AC
  Filled 2021-09-27: qty 2

## 2021-09-27 MED ORDER — ROCURONIUM BROMIDE 10 MG/ML (PF) SYRINGE
PREFILLED_SYRINGE | INTRAVENOUS | Status: DC | PRN
Start: 2021-09-27 — End: 2021-09-27
  Administered 2021-09-27: 50 mg via INTRAVENOUS

## 2021-09-27 MED ORDER — PROPOFOL 10 MG/ML IV BOLUS
INTRAVENOUS | Status: AC
  Filled 2021-09-27: qty 20

## 2021-09-27 MED ORDER — IBUPROFEN 600 MG PO TABS: 600.0000 mg | ORAL_TABLET | Freq: Four times a day (QID) | ORAL | 3 refills | Status: DC | PRN

## 2021-09-27 MED ORDER — SUGAMMADEX SODIUM 200 MG/2ML IV SOLN
INTRAVENOUS | Status: DC | PRN
Start: 2021-09-27 — End: 2021-09-27
  Administered 2021-09-27: 200 mg via INTRAVENOUS

## 2021-09-27 MED ORDER — CHLORHEXIDINE GLUCONATE 0.12 % MT SOLN: 15.0000 mL | Freq: Once | OROMUCOSAL | Status: AC

## 2021-09-27 MED ORDER — DEXAMETHASONE SODIUM PHOSPHATE 10 MG/ML IJ SOLN
INTRAMUSCULAR | Status: DC | PRN
  Administered 2021-09-27: 10 mg via INTRAVENOUS

## 2021-09-27 MED ORDER — ORAL CARE MOUTH RINSE: 15.0000 mL | Freq: Once | OROMUCOSAL | Status: AC

## 2021-09-27 MED ORDER — ROCURONIUM BROMIDE 10 MG/ML (PF) SYRINGE
PREFILLED_SYRINGE | INTRAVENOUS | Status: AC
  Filled 2021-09-27: qty 10

## 2021-09-27 SURGICAL SUPPLY — 40 items
APPLICATOR COTTON TIP 6 STRL (MISCELLANEOUS) ×1 IMPLANT
APPLICATOR COTTON TIP 6IN STRL (MISCELLANEOUS) ×2 IMPLANT
BLADE SURG 15 STRL LF DISP TIS (BLADE) ×1 IMPLANT
BLADE SURG 15 STRL SS (BLADE) ×2
CABLE HIGH FREQUENCY MONO STRZ (ELECTRODE) IMPLANT
CANNULA CURETTE W/SYR 7 (CANNULA) ×2 IMPLANT
DEFOGGER SCOPE WARMER CLEARIFY (MISCELLANEOUS) ×2 IMPLANT
DERMABOND ADVANCED (GAUZE/BANDAGES/DRESSINGS) ×1
DERMABOND ADVANCED .7 DNX12 (GAUZE/BANDAGES/DRESSINGS) ×1 IMPLANT
DRSG OPSITE POSTOP 3X4 (GAUZE/BANDAGES/DRESSINGS) ×2 IMPLANT
DURAPREP 26ML APPLICATOR (WOUND CARE) ×2 IMPLANT
ELECT REM PT RETURN 9FT ADLT (ELECTROSURGICAL) ×2
ELECTRODE REM PT RTRN 9FT ADLT (ELECTROSURGICAL) ×1 IMPLANT
GLOVE SURG POLYISO LF SZ7 (GLOVE) ×2 IMPLANT
GLOVE SURG UNDER POLY LF SZ7 (GLOVE) ×4 IMPLANT
GLOVE SURG UNDER POLY LF SZ7.5 (GLOVE) ×4 IMPLANT
GOWN STRL REUS W/ TWL LRG LVL3 (GOWN DISPOSABLE) ×3 IMPLANT
GOWN STRL REUS W/TWL LRG LVL3 (GOWN DISPOSABLE) ×6
LIGASURE VESSEL 5MM BLUNT TIP (ELECTROSURGICAL) IMPLANT
NEEDLE SPNL 18GX3.5 QUINCKE PK (NEEDLE) ×2 IMPLANT
NS IRRIG 1000ML POUR BTL (IV SOLUTION) ×2 IMPLANT
PACK LAPAROSCOPY BASIN (CUSTOM PROCEDURE TRAY) ×2 IMPLANT
PACK TRENDGUARD 450 HYBRID PRO (MISCELLANEOUS) IMPLANT
PAD OB MATERNITY 4.3X12.25 (PERSONAL CARE ITEMS) ×2 IMPLANT
POUCH LAPAROSCOPIC INSTRUMENT (MISCELLANEOUS) ×2 IMPLANT
POUCH SPECIMEN RETRIEVAL 10MM (ENDOMECHANICALS) IMPLANT
PROTECTOR NERVE ULNAR (MISCELLANEOUS) ×4 IMPLANT
SCISSORS LAP 5X35 DISP (ENDOMECHANICALS) IMPLANT
SET IRRIG TUBING LAPAROSCOPIC (IRRIGATION / IRRIGATOR) ×2 IMPLANT
SET TUBE SMOKE EVAC HIGH FLOW (TUBING) ×2 IMPLANT
SLEEVE ADV FIXATION 5X100MM (TROCAR) IMPLANT
SLEEVE XCEL OPT CAN 5 100 (ENDOMECHANICALS) IMPLANT
SUT MON AB 4-0 PS1 27 (SUTURE) IMPLANT
SUT VICRYL 0 UR6 27IN ABS (SUTURE) ×2 IMPLANT
SYR 10ML LL (SYRINGE) ×2 IMPLANT
TOWEL GREEN STERILE FF (TOWEL DISPOSABLE) ×4 IMPLANT
TRAY FOLEY W/BAG SLVR 14FR (SET/KITS/TRAYS/PACK) ×2 IMPLANT
TRENDGUARD 450 HYBRID PRO PACK (MISCELLANEOUS)
TROCAR ADV FIXATION 5X100MM (TROCAR) ×2 IMPLANT
TROCAR BALLN 12MMX100 BLUNT (TROCAR) ×2 IMPLANT

## 2021-09-27 NOTE — Progress Notes (Signed)
Transport arrived for patient. Patient unable to remove earring, nose,, and lip piecing. Consent obtained, and report given to Short stay RN. Patient accompanied by mother.

## 2021-09-27 NOTE — Anesthesia Preprocedure Evaluation (Addendum)
Anesthesia Evaluation  Patient identified by MRN, date of birth, ID band Patient awake    Reviewed: Allergy & Precautions, NPO status , Patient's Chart, lab work & pertinent test results  History of Anesthesia Complications Negative for: history of anesthetic complications  Airway Mallampati: II  TM Distance: >3 FB Neck ROM: Full    Dental  (+) Dental Advisory Given, Missing  Lip and nasal septum piercings :   Pulmonary asthma (no inhaler use x years) , Current Smoker (vapes) and Patient abstained from smoking.,    Pulmonary exam normal        Cardiovascular negative cardio ROS Normal cardiovascular exam     Neuro/Psych  Headaches, PSYCHIATRIC DISORDERS Anxiety Depression Bipolar Disorder  Hx suicide attempt, overdose Hx self-harm, cutting    GI/Hepatic negative GI ROS, (+)     substance abuse  marijuana use,   Endo/Other  negative endocrine ROS  Renal/GU negative Renal ROS     Musculoskeletal negative musculoskeletal ROS (+) narcotic dependent  Abdominal   Peds  (+) ADHD Hematology negative hematology ROS (+)   Anesthesia Other Findings   Reproductive/Obstetrics                            Anesthesia Physical Anesthesia Plan  ASA: 2  Anesthesia Plan: General   Post-op Pain Management:    Induction: Intravenous  PONV Risk Score and Plan: 3 and Treatment may vary due to age or medical condition, Ondansetron, Dexamethasone and Midazolam  Airway Management Planned: Oral ETT  Additional Equipment: None  Intra-op Plan:   Post-operative Plan: Extubation in OR  Informed Consent: I have reviewed the patients History and Physical, chart, labs and discussed the procedure including the risks, benefits and alternatives for the proposed anesthesia with the patient or authorized representative who has indicated his/her understanding and acceptance.     Dental advisory  given  Plan Discussed with: CRNA and Anesthesiologist  Anesthesia Plan Comments:        Anesthesia Quick Evaluation

## 2021-09-27 NOTE — MAU Provider Note (Signed)
[name]  is a 20 y.o. G1P0 at [redacted]w[redacted]d who presents to MAU today for follow-up quant hCG after 48 hours. The patient was seen in MAU on 09/23/21 for spotting and pain and had quant hCG of 26,592 and US showed no IUP. She returned on 09/25/21 and qhcg was 27,259 and [place] was unchanged. She denies pain, vaginal bleeding or fever today.   OB History  Gravida Para Term Preterm AB Living  1            SAB IAB Ectopic Multiple Live Births               # Outcome Date GA Lbr Len/2nd Weight Sex Delivery Anes PTL Lv  1 Current             Past Medical History:  Diagnosis Date   Allergy    Depression    Mental disorder    Overdose Jan 2015   ROS: no VB no pain  BP 117/69 (BP Location: Right Arm)   Pulse 87   Temp 98.3 F (36.8 C) (Oral)   Resp 18   Ht 5\' 6"  (1.676 m)   Wt 72.6 kg   LMP 08/07/2021   SpO2 100%   BMI 25.84 kg/m   CONSTITUTIONAL: Well-developed, well-nourished female in no acute distress.  MUSCULOSKELETAL: Normal range of motion.  CARDIOVASCULAR: Regular heart rate RESPIRATORY: Normal effort NEUROLOGICAL: Alert and oriented to person, place, and time.  SKIN: Not diaphoretic. No erythema. No pallor. PSYCH: Normal mood and affect. Normal behavior. Normal judgment and thought content.  Results for orders placed or performed during the hospital encounter of 09/27/21 (from the past 24 hour(s))  hCG, quantitative, pregnancy     Status: Abnormal   Collection Time: 09/27/21  8:52 AM  Result Value Ref Range   hCG, Beta Chain, Quant, S 36,949 (H) <5 mIU/mL   11/27/21 OB Transvaginal  Result Date: 09/27/2021 CLINICAL DATA:  Pregnancy of unknown location. Rising serum beta HCG EXAM: TRANSVAGINAL OB ULTRASOUND TECHNIQUE: Transvaginal ultrasound was performed for complete evaluation of the gestation as well as the maternal uterus, adnexal regions, and pelvic cul-de-sac. COMPARISON:  09/25/2021, 09/23/2021 FINDINGS: Intrauterine gestational sac: None Yolk sac:  Not Visualized. Embryo:   Not Visualized. Cardiac Activity: Not Visualized. Subchorionic hemorrhage:  None visualized. 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 Thickening of the bilateral endometrial stripes is more prevalent on the right. Stable 1.8 cm right adnexal cyst. Right ovary appears otherwise unremarkable. Exophytic 1.0 cm left adnexal cyst. Left ovary is otherwise unremarkable. No solid adnexal masses. No free fluid within the pelvis. IMPRESSION: No intrauterine gestational sac identified. No adnexal mass is visualized. Findings remain compatible with pregnancy of unknown location. Continued close clinical follow-up with serial quantitative beta HCG and short-term follow-up ultrasound is recommended. Electronically Signed   By: 09/25/2021 D.O.   On: 09/27/2021 11:14    MDM: Labs ordered and reviewed. Consult with Dr. 11/27/2021, will rpt Vergie Living.  1200: Dr. [place] in to talk with pt and mother.   A: Pregnancy of unknown location Rh pos  P: Admit to OR Mngt per Dr. Vergie Living, [place], CNM 09/27/2021 11:54 AM

## 2021-09-27 NOTE — MAU Provider Note (Signed)
GYN Note Situation d/w pt and mother. She initially presented to MAU on 9/30 for vaginal spotting and pain and had [place] quant [place] [address] with a negative u/s with plan for rpt quant in 48 hours. On 10/2 she had a quant of 27,259 and a rpt u/s was still negative. Plan was for rpt quant in 48 hours. She has no bleeding or pain and rpt quant today is [phone] and u/s still negative for an IUP with 1.8cm RT adnexal cyst and a 1cm LT adnexal cyst noted  I d/w them her situation as pregnancy of unknown location and have to assume it's an ectopic until proven otherwise, but no obvious ectopic on u/s with such a high quant. I also told them that she has an abnormal, non viable pregnancy based on the insufficient quant rise during that time in addition to the u/s findings.  I told them that I recommend a suction d&c and frozen to check for chorionic villi and if present then she had an abnormal IUP but if no CV then she needs a laparoscopy and potentially remove a tube and/or an ovary based on operative findings. They are amenable to this. She ate a pop tart at 0800 today  Patient consented for suction d&c, possible laparoscopy and removal of tubes and/or ovaries and should be fine for d/c to home after surgery  Patient posted for 1400  Cornelia Copa MD Attending Center for [facility] Healthcare (Faculty Practice) 09/27/2021 Time: 1227pm

## 2021-09-27 NOTE — Discharge Instructions (Addendum)
   We will discuss your surgery once again in detail at your post-op visit in two to four weeks. If you haven't already done so, please call to make your appointment as soon as possible.  Dilation and Curettage or Vacuum Curettage, Care After These instructions give you information on caring for yourself after your procedure. Your doctor may also give you more specific instructions. Call your doctor if you have any problems or questions after your procedure. HOME CARE Do not drive for 24 hours. Wait 1 week before doing any activities that wear you out. Do not stand for a long time. Limit stair climbing to once or twice a day. Rest often. Continue with your usual diet. Drink enough fluids to keep your pee (urine) clear or pale yellow. If you have a hard time pooping (constipation), you may: Take a medicine to help you go poop (laxative) as told by your doctor. Eat more fruit and bran. Drink more fluids. Take showers, not baths, for as long as told by your doctor. Do not swim or use a hot tub until your doctor says it is okay. Have someone with you for 1day after the procedure. Do not douche, use tampons, or have sex (intercourse) until seen by your doctor Only take medicines as told by your doctor. Do not take aspirin. It can cause bleeding. Keep all doctor visits. GET HELP IF: You have cramps or pain not helped by medicine. You have new pain in the belly (abdomen). You have a bad smelling fluid coming from your vagina. You have a rash. You have problems with any medicine. GET HELP RIGHT AWAY IF:  You start to bleed more than a regular period. You have a fever. You have chest pain. You have trouble breathing. You feel dizzy or feel like passing out (fainting). You pass out. You have pain in the tops of your shoulders. You have vaginal bleeding with or without clumps of blood (blood clots). MAKE SURE YOU: Understand these instructions. Will watch your condition. Will get help  right away if you are not doing well or get worse. Document Released: 09/19/2008 Document Revised: 12/16/2013 Document Reviewed: 07/10/2013 ExitCare Patient Information 2015 [employer]. This information is not intended to replace advice given to you by your health care provider. Make sure you discuss any questions you have with your health care provider.   

## 2021-09-27 NOTE — H&P (Signed)
[name]  is a 20 y.o. G1P0 at [redacted]w[redacted]d who presents to MAU today for follow-up quant hCG after 48 hours. The patient was seen in MAU on 09/23/21 for spotting and pain and had quant hCG of 26,592 and US showed no IUP. She returned on 09/25/21 and qhcg was 27,259 and [place] was unchanged. She denies pain, vaginal bleeding or fever today.                    OB History  Gravida Para Term Preterm AB Living  1            SAB IAB Ectopic Multiple Live Births                      # Outcome Date GA Lbr Len/2nd Weight Sex Delivery Anes PTL Lv  1 Current                            Past Medical History:  Diagnosis Date   Allergy     Depression     Mental disorder     Overdose Jan 2015    ROS: no VB no pain   BP 117/69 (BP Location: Right Arm)   Pulse 87   Temp 98.3 F (36.8 C) (Oral)   Resp 18   Ht 5\' 6"  (1.676 m)   Wt 72.6 kg   LMP 08/07/2021   SpO2 100%   BMI 25.84 kg/m   CONSTITUTIONAL: Well-developed, well-nourished female in no acute distress.  MUSCULOSKELETAL: Normal range of motion.  CARDIOVASCULAR: Regular heart rate RESPIRATORY: Normal effort NEUROLOGICAL: Alert and oriented to person, place, and time.  SKIN: Not diaphoretic. No erythema. No pallor. PSYCH: Normal mood and affect. Normal behavior. Normal judgment and thought content.   Lab Results Last 24 Hours       Results for orders placed or performed during the hospital encounter of 09/27/21 (from the past 24 hour(s))  hCG, quantitative, pregnancy     Status: Abnormal    Collection Time: 09/27/21  8:52 AM  Result Value Ref Range    hCG, Beta Chain, Quant, S 36,949 (H) <5 mIU/mL      11/27/21 OB Transvaginal   Result Date: 09/27/2021 CLINICAL DATA:  Pregnancy of unknown location. Rising serum beta HCG EXAM: TRANSVAGINAL OB ULTRASOUND TECHNIQUE: Transvaginal ultrasound was performed for complete evaluation of the gestation as well as the maternal uterus, adnexal regions, and pelvic cul-de-sac. COMPARISON:  09/25/2021,  09/23/2021 FINDINGS: Intrauterine gestational sac: None Yolk sac:  Not Visualized. Embryo:  Not Visualized. Cardiac Activity: Not Visualized. Subchorionic hemorrhage:  None visualized. 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 Thickening of the bilateral endometrial stripes is more prevalent on the right. Stable 1.8 cm right adnexal cyst. Right ovary appears otherwise unremarkable. Exophytic 1.0 cm left adnexal cyst. Left ovary is otherwise unremarkable. No solid adnexal masses. No free fluid within the pelvis. IMPRESSION: No intrauterine gestational sac identified. No adnexal mass is visualized. Findings remain compatible with pregnancy of unknown location. Continued close clinical follow-up with serial quantitative beta HCG and short-term follow-up ultrasound is recommended. Electronically Signed   By: 09/25/2021 D.O.   On: 09/27/2021 11:14     MDM: Labs ordered and reviewed. Consult with Dr. 11/27/2021, will rpt Vergie Living.  1200: Dr. [place] in to talk with pt and mother.    A: Pregnancy of unknown location Rh pos   P:  Admit to OR Mngt per Dr. Wynona Meals, [place], CNM 09/27/2021 11:54 AM   Situation d/w pt and mother. She initially presented to MAU on 9/30 for vaginal spotting and pain and [place] of 35573 with a negative u/s with plan for rpt quant in 48 hours. On 10/2 she had a quant of 27,259 and a rpt u/s was still negative. Plan was for rpt quant in 48 hours. She has no bleeding or pain and rpt quant today is [phone] and u/s still negative for an IUP with 1.8cm RT adnexal cyst and a 1cm LT adnexal cyst noted   I d/w them her situation as pregnancy of unknown location and have to assume it's an ectopic until proven otherwise, but no obvious ectopic on u/s with such a high quant. I also told them that she has an abnormal, non viable pregnancy based on the insufficient quant rise during that time in addition to the u/s findings.  I  told them that I recommend a suction d&c and frozen to check for chorionic villi and if present then she had an abnormal IUP but if no CV then she needs a laparoscopy and potentially remove a tube and/or an ovary based on operative findings. They are amenable to this. She ate a pop tart at 0800 today   Patient consented for suction d&c, possible laparoscopy and removal of tubes and/or ovaries and should be fine for d/c to home after surgery   Patient posted for 1400  Cornelia Copa MD Attending Center for [facility] Healthcare (Faculty Practice) GYN Consult Phone: [phone] (M-F, 0800-1700) & [phone] (Off hours, weekends, holidays)

## 2021-09-27 NOTE — Anesthesia Procedure Notes (Signed)
Procedure Name: Intubation Date/Time: 09/27/2021 4:29 PM Performed by: Genelle Bal, CRNA Pre-anesthesia Checklist: Patient identified, Emergency Drugs available, Suction available and Patient being monitored Patient Re-evaluated:Patient Re-evaluated prior to induction Oxygen Delivery Method: Circle system utilized Preoxygenation: Pre-oxygenation with 100% oxygen Induction Type: IV induction Ventilation: Mask ventilation without difficulty Laryngoscope Size: Mac and 4 Grade View: Grade I Tube type: Oral Number of attempts: 1 Airway Equipment and Method: Stylet Placement Confirmation: ETT inserted through vocal cords under direct vision, positive ETCO2 and breath sounds checked- equal and bilateral Secured at: 22 cm Tube secured with: Tape Dental Injury: Teeth and Oropharynx as per pre-operative assessment

## 2021-09-27 NOTE — Brief Op Note (Signed)
09/27/2021  5:35 PM  PATIENT:  Lowella Dell  20 y.o. female  PRE-OPERATIVE DIAGNOSIS:  ABNORMAL PREGNANCY  POST-OPERATIVE DIAGNOSIS:  ABNORMAL PREGNANCY  PROCEDURE:  ultrasound guided suction d&c  SURGEON:  Surgeon(s) and Role:    Alberton Bing, MD - Primary  ASSISTANTS: none   ANESTHESIA:   general and paracervical block  EBL:  5 mL   BLOOD ADMINISTERED:none  DRAINS:  foley 62mL UOP    LOCAL MEDICATIONS USED:  LIDOCAINE   SPECIMEN:  frozen with +chorionic villi  DISPOSITION OF SPECIMEN:  PATHOLOGY  COUNTS:  YES  TOURNIQUET:  * No tourniquets in log *  DICTATION: .Note written in EPIC  PLAN OF CARE: Discharge to home after PACU  PATIENT DISPOSITION:  PACU - hemodynamically stable.   Delay start of Pharmacological VTE agent (>24hrs) due to surgical blood loss or risk of bleeding: not applicable  Cornelia Copa MD Attending Center for [employer] (Faculty Practice) 09/27/2021 Time: [phone]

## 2021-09-27 NOTE — Progress Notes (Signed)
Unable to remove piercing - dermal, ear, lip and nose.  Patient attempted and Nurse attempted, unsuccessful.  Taped ear ring in left ear.  Taped dermal piercing.

## 2021-09-27 NOTE — Transfer of Care (Signed)
Immediate Anesthesia Transfer of Care Note  Patient: Ellen Huffman  Procedure(s) Performed: DILATATION AND CURETTAGE WITH FROZEN WITH ULTRA SOUND  Patient Location: PACU  Anesthesia Type:General  Level of Consciousness: awake, alert , oriented and patient cooperative  Airway & Oxygen Therapy: Patient Spontanous Breathing  Post-op Assessment: Report given to RN, Post -op Vital signs reviewed and stable and Patient moving all extremities X 4  Post vital signs: Reviewed and stable  Last Vitals:  Vitals Value Taken Time  BP    Temp    Pulse 111 09/27/21 1749  Resp 31 09/27/21 1749  SpO2 100 % 09/27/21 1749  Vitals shown include unvalidated device data.  Last Pain:  Vitals:   09/27/21 1454  TempSrc: Oral  PainSc: 1       Patients Stated Pain Goal: 3 (09/27/21 1454)  Complications: No notable events documented.

## 2021-09-27 NOTE — MAU Note (Signed)
Presents stating she's here for blood work and ultrasound.  Denies current VB, reports VB yesterday & the day before.  Reports intermittent cramping.

## 2021-09-28 ENCOUNTER — Encounter (HOSPITAL_COMMUNITY): Payer: Self-pay | Admitting: Obstetrics and Gynecology

## 2021-09-28 NOTE — Op Note (Signed)
Operative Note   09/27/2021  PRE-OP DIAGNOSIS: Concern for abnormal intrauterine pregnancy   POST-OP DIAGNOSIS: Abnormal intrauterine pregnancy  SURGEON: Surgeon(s) and Role:    Scotia Bing, MD - Primary  ASSISTANT: none  PROCEDURE:  Suction dilation and curettage via ultrasound guidance  ANESTHESIA: General and paracervical block  ESTIMATED BLOOD LOSS: 56mL  DRAINS: indwelling foley 4mL UOP  TOTAL IV FLUIDS: per anesthesia report  SPECIMENS: products of conception to pathology Frozen: +chorionic villi  VTE PROPHYLAXIS: SCDs to the bilateral lower extremitie  ANTIBIOTICS: Doxycycline 100mg  IV x 1 pre op  COMPLICATIONS: none  DISPOSITION: PACU - hemodynamically stable.  CONDITION: stable  BLOOD TYPE: A POS. Rhogam given:not applicable  FINDINGS: Exam under anesthesia revealed 6-8 week sized uterus with no masses and bilateral adnexa without masses or fullness. Moderate amount of products of conception were seen, with gritty texture in all four quadrants. Thin endometrial stripe at the end of the procedure seen on ultrasound  PROCEDURE IN DETAIL:  After informed consent was obtained, the patient was taken to the operating room where anesthesia was obtained without difficulty. The patient was positioned in the dorsal lithotomy position in [place] stirrups. The patient was examined under anesthesia, with the above noted findings.  The bi-valved speculum was placed inside the patient's vagina, and the the anterior lip of the cervix was seen and grasped with the tenaculum.  A paracervical block was achieved with 21mL of 1% lidocaine and then the cervix was progressively dilated to a 27 French-Pratt dilator.  The suction was then calibrated to 30m and connected to the number 8 cannula, which was then introduced with the above noted findings. A gentle curettage was done at the end and yield no products of conception.   The suction was then done one more time to remove any  remaining curettage material. Frozen pathology came back consistent with chorionic villi.   Excellent hemostasis was noted, and all instruments were removed, with excellent hemostasis noted throughout.  She was then taken out of dorsal lithotomy. The patient tolerated the procedure well.  Sponge, lap and instrument counts were correct x2.  The patient was taken to recovery room in excellent condition.  MD Attending Center for Cornelia Copa [employer])

## 2021-09-28 NOTE — Anesthesia Postprocedure Evaluation (Signed)
Anesthesia Post Note  Patient: Ellen Huffman  Procedure(s) Performed: DILATATION AND CURETTAGE WITH FROZEN WITH ULTRA SOUND     Patient location during evaluation: PACU Anesthesia Type: General Level of consciousness: awake and alert Pain management: pain level controlled Vital Signs Assessment: post-procedure vital signs reviewed and stable Respiratory status: spontaneous breathing, nonlabored ventilation and respiratory function stable Cardiovascular status: blood pressure returned to baseline and stable Postop Assessment: no apparent nausea or vomiting Anesthetic complications: no   No notable events documented.  Last Vitals:  Vitals:   09/27/21 1818 09/27/21 1833  BP: 105/72 116/73  Pulse: 71 79  Resp: 16 11  Temp:  (!) 36.2 C  SpO2: 100% 99%    Last Pain:  Vitals:   09/27/21 1818  TempSrc:   PainSc: 5                  Lucretia Kern

## 2021-09-29 LAB — SURGICAL PATHOLOGY

## 2021-09-30 NOTE — Discharge Summary (Signed)
Gynecology Discharge Summary Date of Admission: 09/27/2021 Date of Discharge: 09/27/2021  The patient was admitted, as scheduled, and underwent a suction d&c for an abnormal pregnancy with evaluation for ectopic; please refer to operative note for full details but frozen pathology showed chorionic villi.  She was meeting all post op goals and discharged to home from the PACU.   Allergies as of 09/27/2021       Reactions   Penicillins Rash        Medication List     TAKE these medications    acetaminophen 500 MG tablet Commonly known as: TYLENOL Take 1 tablet (500 mg total) by mouth every 6 (six) hours as needed.   albuterol 108 (90 Base) MCG/ACT inhaler Commonly known as: VENTOLIN HFA Inhale 2 puffs into the lungs every 6 (six) hours as needed for wheezing or shortness of breath.   ibuprofen 600 MG tablet Commonly known as: ADVIL Take 1 tablet (600 mg total) by mouth every 6 (six) hours as needed.        Future Appointments  Date Time Provider Department Center  11/09/2021 11:30 AM Shanna Cisco, NP [facility] None  Request sent for 28m post op follow up  Cornelia Copa MD Attending Center for [facility])

## 2021-10-27 ENCOUNTER — Ambulatory Visit: Payer: Medicaid Other | Admitting: Obstetrics and Gynecology

## 2021-11-09 ENCOUNTER — Telehealth (HOSPITAL_COMMUNITY): Payer: Medicaid Other | Admitting: Psychiatry

## 2021-11-14 ENCOUNTER — Ambulatory Visit (INDEPENDENT_AMBULATORY_CARE_PROVIDER_SITE_OTHER): Payer: Medicaid Other | Admitting: Obstetrics and Gynecology

## 2021-11-14 ENCOUNTER — Encounter: Payer: Self-pay | Admitting: Obstetrics and Gynecology

## 2021-11-14 ENCOUNTER — Other Ambulatory Visit: Payer: Self-pay

## 2021-11-14 VITALS — BP 123/81 | HR 99 | Ht 66.0 in | Wt 166.2 lb

## 2021-11-14 DIAGNOSIS — Z5189 Encounter for other specified aftercare: Secondary | ICD-10-CM

## 2021-11-14 DIAGNOSIS — O039 Complete or unspecified spontaneous abortion without complication: Secondary | ICD-10-CM | POA: Insufficient documentation

## 2021-11-14 DIAGNOSIS — Q513 Bicornate uterus: Secondary | ICD-10-CM

## 2021-11-14 NOTE — Progress Notes (Signed)
  Obstetrics and Gynecology Visit Return Patient Evaluation  Appointment Date: 11/16/2021  Primary Care Provider: Silvano Rusk  OBGYN Clinic: Center for [facility]  Chief Complaint: f/u d&c after abnormal IUP.   History of Present Illness:  Ellen Huffman is a 20 y.o. s/p 10/4 suction d&c. Final pathology negative. She has a period and no issues  Review of Systems: as noted in the History of Present Illness.  Medications:  Ellen Huffman had no medications administered during this visit. Current Outpatient Medications  Medication Sig Dispense Refill   acetaminophen (TYLENOL) 500 MG tablet Take 1 tablet (500 mg total) by mouth every 6 (six) hours as needed. 30 tablet 0   albuterol (VENTOLIN HFA) 108 (90 Base) MCG/ACT inhaler Inhale 2 puffs into the lungs every 6 (six) hours as needed for wheezing or shortness of breath. 8 g 2   ibuprofen (ADVIL) 600 MG tablet Take 1 tablet (600 mg total) by mouth every 6 (six) hours as needed. 60 tablet 3   No current facility-administered medications for this visit.    Allergies: is allergic to penicillins.  Physical Exam:  BP 123/81   Pulse 99   Ht 5\' 6"  (1.676 m)   Wt 166 lb 3.2 oz (75.4 kg)   LMP 11/01/2021 (Exact Date)   Breastfeeding Unknown   BMI 26.83 kg/m  Body mass index is 26.83 kg/m. General appearance: NAD Neuro/Psych:  Normal mood and affect.    Assessment: pt doing well  Plan:  1. Follow-up visit after miscarriage Pt declines birth control. She has used ocps and the patch in the past with only issue of skin irritation with the latter. I told her that if she changes her mind and wants something in the future to let 13/07/2021 know.  Pap smears starting at age 8 recommended.  I d/w her that abnormal IUP likely due to a random, non recurrent issue with the sperm or egg that month and she shouldn't have any issues getting pregnant in the future. I did tell her that a bicornuate uterus can increase the risk  of an SAB and to call 36 if she ever gets pregnant in the future to follow her closely but there is nothing surgically to do about a bicornuate uterus.   2. Bicornuate uterus   RTC: PRN  [place] MD Attending Center for Cornelia Copa [facility])

## 2021-11-20 ENCOUNTER — Encounter (HOSPITAL_COMMUNITY): Payer: Self-pay

## 2021-11-20 ENCOUNTER — Emergency Department (HOSPITAL_COMMUNITY)
Admission: EM | Admit: 2021-11-20 | Discharge: 2021-11-20 | Disposition: A | Discharge status: Home / Self Care | Payer: Medicaid Other | Attending: Emergency Medicine | Admitting: Emergency Medicine

## 2021-11-20 ENCOUNTER — Ambulatory Visit (INDEPENDENT_AMBULATORY_CARE_PROVIDER_SITE_OTHER): Payer: Medicaid Other

## 2021-11-20 ENCOUNTER — Ambulatory Visit
Admission: EM | Admit: 2021-11-20 | Discharge: 2021-11-20 | Disposition: A | Discharge status: Home / Self Care | Payer: Medicaid Other | Attending: Physician Assistant | Admitting: Physician Assistant

## 2021-11-20 ENCOUNTER — Other Ambulatory Visit: Payer: Self-pay

## 2021-11-20 DIAGNOSIS — X509XXA Other and unspecified overexertion or strenuous movements or postures, initial encounter: Secondary | ICD-10-CM | POA: Diagnosis not present

## 2021-11-20 DIAGNOSIS — S92345A Nondisplaced fracture of fourth metatarsal bone, left foot, initial encounter for closed fracture: Secondary | ICD-10-CM

## 2021-11-20 DIAGNOSIS — Y9341 Activity, dancing: Secondary | ICD-10-CM | POA: Insufficient documentation

## 2021-11-20 DIAGNOSIS — S92325A Nondisplaced fracture of second metatarsal bone, left foot, initial encounter for closed fracture: Secondary | ICD-10-CM | POA: Diagnosis not present

## 2021-11-20 DIAGNOSIS — S92322A Displaced fracture of second metatarsal bone, left foot, initial encounter for closed fracture: Secondary | ICD-10-CM | POA: Insufficient documentation

## 2021-11-20 DIAGNOSIS — S99922A Unspecified injury of left foot, initial encounter: Secondary | ICD-10-CM | POA: Diagnosis present

## 2021-11-20 DIAGNOSIS — S92332A Displaced fracture of third metatarsal bone, left foot, initial encounter for closed fracture: Secondary | ICD-10-CM

## 2021-11-20 DIAGNOSIS — S92342A Displaced fracture of fourth metatarsal bone, left foot, initial encounter for closed fracture: Secondary | ICD-10-CM | POA: Diagnosis not present

## 2021-11-20 DIAGNOSIS — S92302A Fracture of unspecified metatarsal bone(s), left foot, initial encounter for closed fracture: Secondary | ICD-10-CM

## 2021-11-20 MED ORDER — IBUPROFEN 600 MG PO TABS: 600.0000 mg | ORAL_TABLET | Freq: Four times a day (QID) | ORAL | 0 refills | Status: DC | PRN

## 2021-11-20 NOTE — ED Provider Notes (Signed)
[facility] EMERGENCY DEPARTMENT Provider Note   CSN: [id number] Arrival date & time: 11/20/21  1232     History No chief complaint on file.   Ellen Huffman is a 20 y.o. female.  The history is provided by the patient and medical records. No language interpreter was used.    20 year old female sent here from [facility] for evaluation of L foot injury.  Pt report she was dancing 2 days ago and felt pain to her L foot.  Since then she notice increasing swelling and pain to affected area, moderate in severity and worse with movement.  No ankle lpain, no numbness.  Was seen at [facility] today and had xray of L foot showing fx.  No other injury.  Pt does have a boot at home.    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   Follow-up visit after miscarriage 11/14/2021   Pregnancy of unknown anatomic location 09/27/2021   Bipolar I disorder, most recent episode (or current) manic (HCC) 06/30/2021   Opioid abuse with opioid-induced mood disorder (HCC) 03/31/2021   Autonomic dysfunction 09/17/2017   Mild headache 09/17/2017   Anxiety state 09/17/2017   Palpitations 09/17/2017   Deliberate self-cutting 07/14/2014   MDD (major depressive disorder), recurrent severe, 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   Cannabis abuse 07/14/2014   Suicide attempt (HCC) 07/14/2014   ADHD (attention deficit hyperactivity disorder), 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   Generalized anxiety disorder 09/15/2013    Past Surgical History:  Procedure Laterality Date   DILATION AND CURETTAGE OF UTERUS N/A 09/27/2021   Procedure: DILATATION AND CURETTAGE WITH FROZEN WITH ULTRA SOUND;  Surgeon: [place] Bing, MD;  Location: [facility] OR;  Service: Gynecology;  Laterality: N/A;     OB History     Gravida  1   Para      Term       Preterm      AB  1   Living         SAB  1   IAB      Ectopic      Multiple      Live Births              Family History  Problem Relation Age of Onset   Migraines Neg Hx    Parkinsonism Neg Hx    Seizures Neg Hx    ADD / ADHD Neg Hx    Bipolar disorder Neg Hx     Social History   Tobacco Use   Smoking status: Never   Smokeless tobacco: Never  Vaping Use   Vaping Use: Every day   Substances: Nicotine, Flavoring  Substance Use Topics   Alcohol use: Not Currently    Comment: liquor - none in last 2 wks as of 09/27/21 per patient   Drug use: Yes    Types: Marijuana    Comment: percocet, last use marijuana 04/2021    Home Medications Prior to Admission medications   Medication Sig Start Date End Date Taking? Authorizing Provider  acetaminophen (TYLENOL) 500 MG tablet Take 1 tablet (500 mg total) by mouth every 6 (six) hours as needed. 09/27/21   [place] Bing, MD  albuterol (VENTOLIN HFA) 108 (90 Base) MCG/ACT inhaler Inhale 2 puffs  into the lungs every 6 (six) hours as needed for wheezing or shortness of breath. 10/25/20   Hall-Potvin, [place], PA-C  ibuprofen (ADVIL) 600 MG tablet Take 1 tablet (600 mg total) by mouth every 6 (six) hours as needed. 09/27/21   Aletha Halim, MD    Allergies    Penicillins  Review of Systems   Review of Systems  Constitutional:  Negative for fever.  Musculoskeletal:  Positive for arthralgias.  Skin:  Negative for wound.  Neurological:  Negative for numbness.   Physical Exam Updated Vital Signs BP (!) 143/92   Pulse 87   Temp 98.9 F (37.2 C) (Oral)   Resp 15   LMP 11/01/2021 (Exact Date)   SpO2 100%   Physical Exam Vitals and nursing note reviewed.  Constitutional:      General: She is not in acute distress.    Appearance: She is well-developed.  HENT:     Head: Atraumatic.  Eyes:     Conjunctiva/sclera: Conjunctivae normal.  Pulmonary:     Effort: Pulmonary effort is normal.  Musculoskeletal:         General: Signs of injury (L foot: edema and tenderness noted to dorsum of foot.  intact pedal lpulse, brisk cap refill) present.     Cervical back: Neck supple.     Comments: L ankle nontender  Skin:    Findings: No rash.  Neurological:     Mental Status: She is alert.  Psychiatric:        Mood and Affect: Mood normal.    ED Results / Procedures / Treatments   Labs (all labs ordered are listed, but only abnormal results are displayed) Labs Reviewed - No data to display  EKG None  Radiology DG Foot Complete Left  Result Date: 11/20/2021 CLINICAL DATA:  Swelling and bruising, posttraumatic EXAM: LEFT FOOT - COMPLETE 3+ VIEW COMPARISON:  None. FINDINGS: Second and fourth metatarsal neck fractures, essentially nondisplaced. Third metatarsal shaft fracture. The shaft fracture is displaced up to 2 mm with angulation. No dislocation. IMPRESSION: Second through fourth metatarsal fractures as described. Electronically Signed   By: Jorje Guild M.D.   On: 11/20/2021 12:07    Procedures Procedures   Medications Ordered in ED Medications - No data to display  ED Course  I have reviewed the triage vital signs and the nursing notes.  Pertinent labs & imaging results that were available during my care of the patient were reviewed by me and considered in my medical decision making (see chart for details).    MDM Rules/Calculators/A&P                           BP (!) 143/92   Pulse 87   Temp 98.9 F (37.2 C) (Oral)   Resp 15   LMP 11/01/2021 (Exact Date)   SpO2 100%   Final Clinical Impression(s) / ED Diagnoses Final diagnoses:  Fracture of unspecified metatarsal bone(s), left foot, initial encounter for closed fracture    Rx / DC Orders ED Discharge Orders          Ordered    ibuprofen (ADVIL) 600 MG tablet  Every 6 hours PRN        11/20/21 1344           1:39 PM Pt injured her L foot a few days ago.  Xray shows fx involving the 2nd-3rd-4th metatarsal.   This is a closed injury.  I discussed care with Dr.  Trifan. Felt this is not likely to be a lisfranc injury.  Recommend boot, crutches, non weight bearing and to f/u with orthopedist for further care.    Fayrene Helper, PA-C 11/20/21 1346    Renaye Rakers Kermit Balo, MD 11/20/21 1357

## 2021-11-20 NOTE — ED Provider Notes (Signed)
[facility]    CSN: [id number] Arrival date & time: 11/20/21  0940      History   Chief Complaint Chief Complaint  Patient presents with   Foot Pain    left    HPI Ellen Huffman is a 20 y.o. female.   Patient here today for evaluation of left foot pain that started 2 days ago.  She states initially that pain started after she had been running in her house, she then told the xray tech she sustained injury while dancing. When I, the provider, asked she states she "does not know" what happened, states she had been dancing, boyfriend who is with her states she fell-- she denies falling, states she did not hit the ground, but then states when she "got up" she noted pain. Overall there is no clear picture as to how injury occurred.  Pain is present to the dorsal surface of her left foot and she has had some swelling and bruising as well.  Walking makes symptoms worse.  She denies any numbness or tingling.  She has been taking ibuprofen with mild relief of symptoms.  The history is provided by the patient.  Foot Pain Pertinent negatives include no abdominal pain and no shortness of breath.   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   Follow-up visit after miscarriage 11/14/2021   Pregnancy of unknown anatomic location 09/27/2021   Bipolar I disorder, most recent episode (or current) manic ([place]) 06/30/2021   Opioid abuse with opioid-induced mood disorder ([place]) 03/31/2021   Autonomic dysfunction 09/17/2017   Mild headache 09/17/2017   Anxiety state 09/17/2017   Palpitations 09/17/2017   Deliberate self-cutting 07/14/2014   MDD (major depressive disorder), recurrent severe, without psychosis ([place]) 07/14/2014   Cannabis abuse 07/14/2014   Suicide attempt ([place]) 07/14/2014   ADHD  (attention deficit hyperactivity disorder), 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   Generalized anxiety disorder 09/15/2013    Past Surgical History:  Procedure Laterality Date   DILATION AND CURETTAGE OF UTERUS N/A 09/27/2021   Procedure: DILATATION AND CURETTAGE WITH FROZEN WITH ULTRA SOUND;  Surgeon: Aletha Halim, MD;  Location: [place];  Service: Gynecology;  Laterality: N/A;    OB History     Gravida  1   Para      Term      Preterm      AB  1   Living         SAB  1   IAB      Ectopic      Multiple      Live Births               Home Medications    Prior to Admission medications   Medication Sig Start Date End Date Taking? Authorizing Provider  acetaminophen (TYLENOL) 500 MG tablet Take 1 tablet (500 mg total) by mouth every 6 (six) hours as needed. 09/27/21   Aletha Halim, MD  albuterol (VENTOLIN HFA) 108 (90 Base) MCG/ACT inhaler Inhale 2 puffs into the lungs every 6 (six) hours as needed for wheezing or shortness of breath. 10/25/20   Hall-Potvin, [place], PA-C  ibuprofen (ADVIL) 600 MG tablet Take 1 tablet (600 mg total) by mouth every 6 (six) hours as needed. 09/27/21  [place] Bing, MD    Family History Family History  Problem Relation Age of Onset   Migraines Neg Hx    Parkinsonism Neg Hx    Seizures Neg Hx    ADD / ADHD Neg Hx    Bipolar disorder Neg Hx     Social History Social History   Tobacco Use   Smoking status: Never   Smokeless tobacco: Never  Vaping Use   Vaping Use: Every day   Substances: Nicotine, Flavoring  Substance Use Topics   Alcohol use: Not Currently    Comment: liquor - none in last 2 wks as of 09/27/21 per patient   Drug use: Yes    Types: Marijuana    Comment: percocet, last use marijuana 04/2021     Allergies   Penicillins   Review of Systems Review of Systems  Constitutional:  Negative for chills and fever.  Eyes:  Negative for discharge and redness.  Respiratory:  Negative for shortness of breath.    Gastrointestinal:  Negative for abdominal pain, nausea and vomiting.  Musculoskeletal:  Positive for arthralgias and joint swelling.  Skin:  Positive for color change.  Neurological:  Negative for numbness.    Physical Exam Triage Vital Signs ED Triage Vitals  Enc Vitals Group     BP 11/20/21 1112 137/82     Pulse Rate 11/20/21 1112 77     Resp 11/20/21 1112 18     Temp 11/20/21 1112 98 F (36.7 C)     Temp Source 11/20/21 1112 Oral     SpO2 11/20/21 1112 98 %     Weight --      Height --      Head Circumference --      Peak Flow --      Pain Score 11/20/21 1115 8     Pain Loc --      Pain Edu? --      Excl. in [place]? --    No data found.  Updated Vital Signs BP 137/82 (BP Location: Left Arm)   Pulse 77   Temp 98 F (36.7 C) (Oral)   Resp 18   LMP 11/01/2021 (Exact Date)   SpO2 98%   Breastfeeding No     Physical Exam Vitals and nursing note reviewed.  Constitutional:      General: She is not in acute distress.    Appearance: Normal appearance. She is not ill-appearing.  HENT:     Head: Normocephalic and atraumatic.  Eyes:     Conjunctiva/sclera: Conjunctivae normal.  Cardiovascular:     Rate and Rhythm: Normal rate.  Pulmonary:     Effort: Pulmonary effort is normal.  Musculoskeletal:     Comments: Significant swelling, bruising and TTP to left dorsal distal foot, decreased ROM of left toes due to pain  Skin:    Capillary Refill: Normal cap refill to left toes Neurological:     Mental Status: She is alert.     Comments: Gross sensation intact to left toes distally  Psychiatric:        Mood and Affect: Mood normal.        Behavior: Behavior normal.        Thought Content: Thought content normal.     UC Treatments / Results  Labs (all labs ordered are listed, but only abnormal results are displayed) Labs Reviewed - No data to display  EKG   Radiology DG Foot Complete Left  Result Date: 11/20/2021 CLINICAL DATA:  Swelling and bruising,  posttraumatic EXAM: LEFT FOOT - COMPLETE 3+ VIEW COMPARISON:  None. FINDINGS: Second and fourth metatarsal neck fractures, essentially nondisplaced. Third metatarsal shaft fracture. The shaft fracture is displaced up to 2 mm with angulation. No dislocation. IMPRESSION: Second through fourth metatarsal fractures as described. Electronically Signed   By: Jorje Guild M.D.   On: 11/20/2021 12:07    Procedures Procedures (including critical care time)  Medications Ordered in UC Medications - No data to display  Initial Impression / Assessment and Plan / UC Course  I have reviewed the triage vital signs and the nursing notes.  Pertinent labs & imaging results that were available during my care of the patient were reviewed by me and considered in my medical decision making (see chart for details).    Xray ordered with significant fractures to left foot, one of which is significantly displaced (3rd metatarsal). Discussed option for further evaluation in the ED vs outpatient ortho follow up tomorrow and patient reports that she would like to be seen in the ED. She declines post op boot for stability. She will continue to remain nonweightbearing with assistance of rolling walker with seat.   Final Clinical Impressions(s) / UC Diagnoses   Final diagnoses:  Displaced fracture of third metatarsal bone, left foot, initial encounter for closed fracture  Closed nondisplaced fracture of second metatarsal bone of left foot, initial encounter  Closed nondisplaced fracture of fourth metatarsal bone of left foot, initial encounter   Discharge Instructions   None    ED Prescriptions   None    PDMP not reviewed this encounter.   Francene Finders, PA-C 11/20/21 1258

## 2021-11-20 NOTE — ED Triage Notes (Signed)
Two day h/o left dorsum foot pain that started after patient was running in the house. She describes the pain as sharp. Confirms bruising and swelling.  Denies numbness. Sxs aggravated by walking. Has been taking ibuprofen w/o relief. No falls or injuries. No right foot pain.

## 2021-11-20 NOTE — ED Triage Notes (Signed)
Patient sent from [facility] for fractured left foot per xray. Patient reports that she felt something give/ heard pop while dancing on Friday night

## 2021-11-20 NOTE — Discharge Instructions (Signed)
Please wear boot and use crutches to help protect your foot injury.  Do not bear any  weight.  Call and follow up closely with orthopedist for further care.

## 2021-11-20 NOTE — ED Notes (Signed)
Discharged by PA at triage. 

## 2022-01-05 ENCOUNTER — Ambulatory Visit
Admission: EM | Admit: 2022-01-05 | Discharge: 2022-01-05 | Disposition: A | Discharge status: Home / Self Care | Payer: Medicaid Other | Attending: Physician Assistant | Admitting: Physician Assistant

## 2022-01-05 ENCOUNTER — Encounter: Payer: Self-pay | Admitting: Emergency Medicine

## 2022-01-05 ENCOUNTER — Other Ambulatory Visit: Payer: Self-pay

## 2022-01-05 DIAGNOSIS — L0291 Cutaneous abscess, unspecified: Secondary | ICD-10-CM

## 2022-01-05 MED ORDER — DOXYCYCLINE HYCLATE 100 MG PO CAPS: 100.0000 mg | ORAL_CAPSULE | Freq: Two times a day (BID) | ORAL | 0 refills | Status: DC

## 2022-01-05 NOTE — ED Triage Notes (Signed)
Picked at spot on leg 2 weeks ago. Picked at it, it drained a lot. Now more pustules are developing per patient.

## 2022-01-05 NOTE — ED Provider Notes (Signed)
[facility]    CSN: [id number] Arrival date & time: 01/05/22  1305      History   Chief Complaint Chief Complaint  Patient presents with   Abscess    HPI Ellen Huffman is a 21 y.o. female.   Patient here today for evaluation of a pustule to her left leg that she first noticed 2 weeks ago.  She states the area got larger and more red and yesterday she states it drained significantly.  She states she continues to have redness and tenderness to the area.  She has not had any fever.  Initial pressure was in the area of a prior tattoo but patient states tattoo is mostly healed before she noticed lesion on the leg.  The history is provided by the patient.  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   Follow-up visit after miscarriage 11/14/2021   Pregnancy of unknown anatomic location 09/27/2021   Bipolar I disorder, most recent episode (or current) manic (HCC) 06/30/2021   Opioid abuse with opioid-induced mood disorder (HCC) 03/31/2021   Autonomic dysfunction 09/17/2017   Mild headache 09/17/2017   Anxiety state 09/17/2017   Palpitations 09/17/2017   Deliberate self-cutting 07/14/2014   MDD (major depressive disorder), recurrent severe, 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   Cannabis abuse 07/14/2014   Suicide attempt (HCC) 07/14/2014   ADHD (attention deficit hyperactivity disorder), 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 Authorizing Provider  doxycycline (VIBRAMYCIN) 100 MG capsule Take 1 capsule (100 mg total) by mouth 2 (two) times daily. 01/05/22  Yes Tomi Bamberger, PA-C  acetaminophen (TYLENOL) 500 MG tablet Take 1 tablet (500 mg total) by mouth every 6 (six) hours as needed. 09/27/21   [place] Bing, MD  albuterol (VENTOLIN HFA) 108 (90 Base) MCG/ACT inhaler Inhale 2 puffs into the lungs every 6 (six) hours as needed for wheezing or shortness of breath. 10/25/20   Hall-Potvin, [place], PA-C  ibuprofen (ADVIL) 600 MG tablet Take 1 tablet (600 mg total) by mouth every 6 (six) hours as needed for mild pain or moderate pain. 11/20/21   Fayrene Helper, PA-C    Family History Family History  Problem Relation Age of Onset   Migraines Neg Hx    Parkinsonism Neg Hx    Seizures Neg Hx  ADD / ADHD Neg Hx    Bipolar disorder Neg Hx     Social History Social History   Tobacco Use   Smoking status: Never   Smokeless tobacco: Never  Vaping Use   Vaping Use: Every day   Substances: Nicotine, Flavoring  Substance Use Topics   Alcohol use: Not Currently    Comment: liquor - none in last 2 wks as of 09/27/21 per patient   Drug use: Yes    Types: Marijuana    Comment: percocet, last use marijuana 04/2021     Allergies   Penicillins   Review of Systems Review of Systems  Constitutional:  Negative for chills and fever.  Eyes:  Negative for discharge and redness.  Respiratory:  Negative for shortness of breath.   Gastrointestinal:  Negative for abdominal pain, nausea and vomiting.  Skin:  Positive for color change and wound.    Physical Exam Triage Vital Signs ED Triage Vitals  Enc Vitals Group     BP 01/05/22  1400 122/77     Pulse Rate 01/05/22 1400 86     Resp 01/05/22 1400 16     Temp 01/05/22 1400 98.1 F (36.7 C)     Temp Source 01/05/22 1400 Oral     SpO2 01/05/22 1400 98 %     Weight --      Height --      Head Circumference --      Peak Flow --      Pain Score 01/05/22 1401 8     Pain Loc --      Pain Edu? --      Excl. in [place]? --    No data found.  Updated Vital Signs BP 122/77 (BP Location: Left Arm)    Pulse 86    Temp 98.1 F (36.7 C) (Oral)    Resp 16    SpO2 98%      Physical Exam Vitals and nursing note reviewed.  Constitutional:      General: She is not in acute distress.    Appearance: Normal appearance. She is not ill-appearing.  HENT:     Head: Normocephalic and atraumatic.  Eyes:     Conjunctiva/sclera: Conjunctivae normal.  Cardiovascular:     Rate and Rhythm: Normal rate.  Pulmonary:     Effort: Pulmonary effort is normal.  Skin:    Comments: Small superficial wound to left anterior upper leg with surrounding erythema.  Induration noted but no fluctuance.  Neurological:     Mental Status: She is alert.  Psychiatric:        Mood and Affect: Mood normal.        Behavior: Behavior normal.        Thought Content: Thought content normal.     UC Treatments / Results  Labs (all labs ordered are listed, but only abnormal results are displayed) Labs Reviewed - No data to display  EKG   Radiology No results found.  Procedures Procedures (including critical care time)  Medications Ordered in UC Medications - No data to display  Initial Impression / Assessment and Plan / UC Course  I have reviewed the triage vital signs and the nursing notes.  Pertinent labs & imaging results that were available during my care of the patient were reviewed by me and considered in my medical decision making (see chart for details).    Dicyclomine prescribed to cover abscess.  Recommended follow-up if symptoms fail to improve or worsen.   Final  Clinical  Impressions(s) / UC Diagnoses   Final diagnoses:  Abscess   Discharge Instructions   None    ED Prescriptions     Medication Sig Dispense Auth. Provider   doxycycline (VIBRAMYCIN) 100 MG capsule Take 1 capsule (100 mg total) by mouth 2 (two) times daily. 20 capsule Francene Finders, PA-C      PDMP not reviewed this encounter.   Francene Finders, PA-C 01/05/22 1434

## 2022-01-09 ENCOUNTER — Other Ambulatory Visit: Payer: Self-pay

## 2022-01-09 ENCOUNTER — Emergency Department (HOSPITAL_COMMUNITY)
Admission: EM | Admit: 2022-01-09 | Discharge: 2022-01-09 | Disposition: A | Discharge status: Home / Self Care | Payer: Medicaid Other | Attending: Emergency Medicine | Admitting: Emergency Medicine

## 2022-01-09 ENCOUNTER — Encounter (HOSPITAL_COMMUNITY): Payer: Self-pay

## 2022-01-09 DIAGNOSIS — N9489 Other specified conditions associated with female genital organs and menstrual cycle: Secondary | ICD-10-CM | POA: Insufficient documentation

## 2022-01-09 DIAGNOSIS — Z79899 Other long term (current) drug therapy: Secondary | ICD-10-CM | POA: Insufficient documentation

## 2022-01-09 DIAGNOSIS — R002 Palpitations: Secondary | ICD-10-CM

## 2022-01-09 LAB — COMPREHENSIVE METABOLIC PANEL
ALT: 19 U/L (ref 0–44)
AST: 21 U/L (ref 15–41)
Albumin: 4.2 g/dL (ref 3.5–5.0)
Alkaline Phosphatase: 67 U/L (ref 38–126)
Anion gap: 9 (ref 5–15)
BUN: 9 mg/dL (ref 6–20)
CO2: 24 mmol/L (ref 22–32)
Calcium: 8.5 mg/dL — ABNORMAL LOW (ref 8.9–10.3)
Chloride: 101 mmol/L (ref 98–111)
Creatinine, Ser: 0.53 mg/dL (ref 0.44–1.00)
GFR, Estimated: >60 mL/min (ref >60)
Glucose, Bld: 89 mg/dL (ref 70–99)
Potassium: 3.3 mmol/L — ABNORMAL LOW (ref 3.5–5.1)
Sodium: 134 mmol/L — ABNORMAL LOW (ref 135–145)
Total Bilirubin: 0.9 mg/dL (ref 0.3–1.2)
Total Protein: 8 g/dL (ref 6.5–8.1)

## 2022-01-09 LAB — CBC WITH DIFFERENTIAL/PLATELET
Abs Immature Granulocytes: 0.03 10*3/uL (ref 0.00–0.07)
Basophils Absolute: 0 10*3/uL (ref 0.0–0.1)
Basophils Relative: 0 %
Eosinophils Absolute: 0 10*3/uL (ref 0.0–0.5)
Eosinophils Relative: 0 %
HCT: 40.5 % (ref 36.0–46.0)
Hemoglobin: 13.6 g/dL (ref 12.0–15.0)
Immature Granulocytes: 0 %
Lymphocytes Relative: 27 %
Lymphs Abs: 2.1 10*3/uL (ref 0.7–4.0)
MCH: 31.3 pg (ref 26.0–34.0)
MCHC: 33.6 g/dL (ref 30.0–36.0)
MCV: 93.1 fL (ref 80.0–100.0)
Monocytes Absolute: 0.5 10*3/uL (ref 0.1–1.0)
Monocytes Relative: 7 %
Neutro Abs: 5.2 10*3/uL (ref 1.7–7.7)
Neutrophils Relative %: 66 %
Platelets: 316 10*3/uL (ref 150–400)
RBC: 4.35 MIL/uL (ref 3.87–5.11)
RDW: 13.1 % (ref 11.5–15.5)
WBC: 7.9 10*3/uL (ref 4.0–10.5)
nRBC: 0 % (ref 0.0–0.2)

## 2022-01-09 LAB — RAPID URINE DRUG SCREEN, HOSP PERFORMED
Amphetamines: NOT DETECTED
Barbiturates: NOT DETECTED
Benzodiazepines: NOT DETECTED
Cocaine: POSITIVE — AB
Opiates: NOT DETECTED
Tetrahydrocannabinol: POSITIVE — AB

## 2022-01-09 LAB — I-STAT BETA HCG BLOOD, ED (MC, WL, AP ONLY): I-stat hCG, quantitative: <5 m[IU]/mL (ref <5)

## 2022-01-09 LAB — TROPONIN I (HIGH SENSITIVITY): Troponin I (High Sensitivity): <2 ng/L (ref <18)

## 2022-01-09 NOTE — ED Provider Notes (Signed)
[facility]-EMERGENCY DEPT Provider Note   CSN: [id number] Arrival date & time: 01/09/22  0137     History  Chief Complaint  Patient presents with   Addiction Problem   Palpitations    Gertrude Iannacone is a 21 y.o. female.  The history is provided by the patient. No language interpreter was used.  Palpitations Palpitations quality:  Fast Onset quality:  Chronic Duration: 7-8 months of intermittent symptoms. Timing:  Intermittent Progression:  Waxing and waning Chronicity:  Chronic Context: anxiety and illicit drugs   Relieved by:  Nothing Exacerbated by: unable to specify. Ineffective treatments:  None tried Associated symptoms: no chest pain, no hemoptysis, no lower extremity edema, no shortness of breath, no syncope and no vomiting   Risk factors: stress       Home Medications Prior to Admission medications   Medication Sig Start Date End Date Taking? Authorizing Provider  acetaminophen (TYLENOL) 500 MG tablet Take 1 tablet (500 mg total) by mouth every 6 (six) hours as needed. 09/27/21   Weston Bing, MD  albuterol (VENTOLIN HFA) 108 (90 Base) MCG/ACT inhaler Inhale 2 puffs into the lungs every 6 (six) hours as needed for wheezing or shortness of breath. 10/25/20   Hall-Potvin, [place], PA-C  doxycycline (VIBRAMYCIN) 100 MG capsule Take 1 capsule (100 mg total) by mouth 2 (two) times daily. 01/05/22   Tomi Bamberger, PA-C  ibuprofen (ADVIL) 600 MG tablet Take 1 tablet (600 mg total) by mouth every 6 (six) hours as needed for mild pain or moderate pain. 11/20/21   Fayrene Helper, PA-C      Allergies    Penicillins    Review of Systems   Review of Systems  Respiratory:  Negative for hemoptysis and shortness of breath.   Cardiovascular:  Positive for palpitations. Negative for chest pain and syncope.  Gastrointestinal:  Negative for vomiting.  Ten systems reviewed and are negative for acute change, except as noted in the HPI.    Physical  Exam Updated Vital Signs BP (!) 129/92    Pulse 95    Temp 98.4 F (36.9 C)    Resp 15    SpO2 100%   Physical Exam Vitals and nursing note reviewed.  Constitutional:      General: She is not in acute distress.    Appearance: She is well-developed. She is not diaphoretic.     Comments: Nontoxic-appearing and in no acute distress  HENT:     Head: Normocephalic and atraumatic.  Eyes:     General: No scleral icterus.    Conjunctiva/sclera: Conjunctivae normal.  Cardiovascular:     Rate and Rhythm: Normal rate and regular rhythm.     Pulses: Normal pulses.  Pulmonary:     Effort: Pulmonary effort is normal. No respiratory distress.     Comments: Lungs clear to auscultation bilaterally.  Respirations even and unlabored. Abdominal:     General: There is no distension.  Musculoskeletal:        General: Normal range of motion.     Cervical back: Normal range of motion.     Right lower leg: No edema.     Left lower leg: No edema.  Skin:    General: Skin is warm and dry.     Coloration: Skin is not pale.     Findings: No erythema or rash.  Neurological:     Mental Status: She is alert and oriented to person, place, and time.     Coordination: Coordination normal.  Psychiatric:        Mood and Affect: Mood is anxious.        Behavior: Behavior normal.    ED Results / Procedures / Treatments   Labs (all labs ordered are listed, but only abnormal results are displayed) Labs Reviewed  COMPREHENSIVE METABOLIC PANEL - Abnormal; Notable for the following components:      Result Value   Sodium 134 (*)    Potassium 3.3 (*)    Calcium 8.5 (*)    All other components within normal limits  RAPID URINE DRUG SCREEN, HOSP PERFORMED - Abnormal; Notable for the following components:   Cocaine POSITIVE (*)    Tetrahydrocannabinol POSITIVE (*)    All other components within normal limits  CBC WITH DIFFERENTIAL/PLATELET  I-STAT BETA HCG BLOOD, ED ([facility], [facility], [facility] ONLY)  TROPONIN I (HIGH  SENSITIVITY)    EKG ED ECG REPORT   Date: 01/09/2022  Rate: 99  Rhythm: normal sinus rhythm  QRS Axis: normal  Intervals: normal  ST/T Wave abnormalities: normal  Conduction Disutrbances:none  Narrative Interpretation: sinus rhythm  Old EKG Reviewed: none available  I have personally reviewed the EKG tracing and agree with the computerized printout as noted.  Radiology No results found.  Procedures Procedures    Medications Ordered in ED Medications - No data to display  ED Course/ Medical Decision Making/ A&P                           Medical Decision Making  This patient presents to the ED for concern of palpitations, this involves an extensive number of treatment options, and is a complaint that carries with it a high risk of complications and morbidity.  The differential diagnosis includes anxiety/stress reaction vs substance induced vs arrhythmia vs thyroid disease vs cardiomyopathy   Co morbidities that complicate the patient evaluation  Hx of mental disorder and polysubstance abuse   Lab Tests:  I Ordered, and personally interpreted labs.  The pertinent results include:  Cocaine positive on UDS. Remainder of laboratory evaluation reassuring   Cardiac Monitoring:  The patient was maintained on a cardiac monitor.  I personally viewed and interpreted the cardiac monitored which showed an underlying rhythm of: sinus rhythm   Test Considered:  TSH   Reevaluation:  After the interventions noted above, I reevaluated the patient and found that they have :stayed the same   Social Determinants of Health:  Ongoing drug use   Dispostion:  After consideration of the diagnostic results and the patients response to treatment, I feel that the patent would benefit from close cardiology follow up. May benefit from Holter monitoring +/- echocardiogram as outpatient. Advised discontinuation of illicit drugs         Final Clinical Impression(s) / ED  Diagnoses Final diagnoses:  Palpitations    Rx / DC Orders ED Discharge Orders     None         Antony Madura, PA-C 01/09/22 7209    Tilden Fossa, MD 01/09/22 1924

## 2022-01-09 NOTE — Discharge Instructions (Addendum)
Your work-up in the emergency department today has been reassuring.  We recommend close follow-up with cardiology as you may benefit from outpatient Holter monitoring for further evaluation of your symptoms.  Avoid use of illicit substances as this can exacerbate your palpitations.  Return for new or concerning symptoms.

## 2022-01-09 NOTE — ED Triage Notes (Signed)
Pt reports using cocaine yesterday and is now having palpitations. Pt states that she had seizures and issues with her heart when she use to use drugs 6 months ago.

## 2022-03-28 ENCOUNTER — Other Ambulatory Visit: Payer: Self-pay

## 2022-03-28 ENCOUNTER — Encounter: Payer: Self-pay | Admitting: Emergency Medicine

## 2022-03-28 ENCOUNTER — Ambulatory Visit
Admission: EM | Admit: 2022-03-28 | Discharge: 2022-03-28 | Disposition: A | Discharge status: Home / Self Care | Payer: Medicaid Other | Attending: Internal Medicine | Admitting: Internal Medicine

## 2022-03-28 ENCOUNTER — Encounter (HOSPITAL_COMMUNITY): Payer: Self-pay

## 2022-03-28 ENCOUNTER — Emergency Department (HOSPITAL_COMMUNITY): Payer: Medicaid Other

## 2022-03-28 ENCOUNTER — Emergency Department (HOSPITAL_COMMUNITY)
Admission: EM | Admit: 2022-03-28 | Discharge: 2022-03-28 | Disposition: A | Discharge status: Home / Self Care | Payer: Medicaid Other | Attending: Emergency Medicine | Admitting: Emergency Medicine

## 2022-03-28 DIAGNOSIS — R0789 Other chest pain: Secondary | ICD-10-CM | POA: Insufficient documentation

## 2022-03-28 DIAGNOSIS — N9489 Other specified conditions associated with female genital organs and menstrual cycle: Secondary | ICD-10-CM | POA: Diagnosis not present

## 2022-03-28 DIAGNOSIS — R002 Palpitations: Secondary | ICD-10-CM | POA: Insufficient documentation

## 2022-03-28 DIAGNOSIS — M7989 Other specified soft tissue disorders: Secondary | ICD-10-CM

## 2022-03-28 LAB — BASIC METABOLIC PANEL
Anion gap: 8 (ref 5–15)
BUN: 9 mg/dL (ref 6–20)
CO2: 26 mmol/L (ref 22–32)
Calcium: 9.5 mg/dL (ref 8.9–10.3)
Chloride: 104 mmol/L (ref 98–111)
Creatinine, Ser: 0.65 mg/dL (ref 0.44–1.00)
GFR, Estimated: >60 mL/min (ref >60)
Glucose, Bld: 93 mg/dL (ref 70–99)
Potassium: 3.6 mmol/L (ref 3.5–5.1)
Sodium: 138 mmol/L (ref 135–145)

## 2022-03-28 LAB — CBC
HCT: 41.1 % (ref 36.0–46.0)
Hemoglobin: 13.9 g/dL (ref 12.0–15.0)
MCH: 31.4 pg (ref 26.0–34.0)
MCHC: 33.8 g/dL (ref 30.0–36.0)
MCV: 92.8 fL (ref 80.0–100.0)
Platelets: 279 10*3/uL (ref 150–400)
RBC: 4.43 MIL/uL (ref 3.87–5.11)
RDW: 13 % (ref 11.5–15.5)
WBC: 7.5 10*3/uL (ref 4.0–10.5)
nRBC: 0 % (ref 0.0–0.2)

## 2022-03-28 LAB — I-STAT BETA HCG BLOOD, ED (MC, WL, AP ONLY): I-stat hCG, quantitative: <5 m[IU]/mL (ref <5)

## 2022-03-28 LAB — TROPONIN I (HIGH SENSITIVITY): Troponin I (High Sensitivity): <2 ng/L (ref <18)

## 2022-03-28 NOTE — Discharge Instructions (Signed)
Please go to the ER as soon as you leave urgent care for further evaluation and management.  

## 2022-03-28 NOTE — ED Triage Notes (Signed)
Pt sent for UC for irregular EKG. Pt c/o bilat hand swelling intermittentx2wks. Pt c/o heart palpitations, intermittent left sided chest painx88mos. Pt denies SOB, N/V. ?

## 2022-03-28 NOTE — Discharge Instructions (Signed)
Return for any problem.  ?

## 2022-03-28 NOTE — ED Triage Notes (Addendum)
Patient c/o bilateral hand/fingers swelling for several weeks.  No apparent injury to her hands.  Patient is requesting a referral to get checked for Lupus.  Mom has Lupus and is very concerned. ? ?Patient also states she has a history of heart palpitations and she feels her heart beating really fast w/simple activity. ?

## 2022-03-28 NOTE — ED Notes (Signed)
Pt still not on his room at this time. ?

## 2022-03-28 NOTE — ED Provider Notes (Signed)
?[facility] ? ? ? ?CSN: [id number] ?Arrival date & time: 03/28/22  1533 ? ? ?  ? ?History   ?Chief Complaint ?Chief Complaint  ?Patient presents with  ? Bilateral fingers swelling  ? ? ?HPI ?Tametha Allread is a 21 y.o. female.  ? ?Patient presents with bilateral hand swelling as well as chest pain and palpitations.  Patient reports that she noticed the hand swelling a few weeks prior and has been intermittent.  Denies any associated hand pain or numbness or tingling.  Denies any injury to the area.  Patient reports that she has been having palpitations for "a while".  Has associated left-sided chest pain that radiates down left arm as well.  She was seen in the emergency department in January and was advised to follow-up with cardiology but patient reports that she never did this.  Symptoms have seemed to worsened and she has developed bilateral hand swelling as well.  Denies associated shortness of breath, headache, dizziness, nausea, vomiting, blurred vision.  Patient reports that she does have a history of illicit drug use but states that she has not used drugs in approximately 9 months, although chart reports that she tested positive for cocaine and THC at ED visit in January. ? ? ? ?Past Medical History:  ?Diagnosis Date  ? Allergy   ? Anxiety   ? Bipolar disorder (HCC)   ? Depression   ? Mental disorder   ? Overdose 12/25/2013  ? ? ?Patient Active Problem List  ? Diagnosis Date Noted  ? 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 Follow-up visit after miscarriage 11/14/2021  ? Pregnancy of unknown anatomic location 09/27/2021  ? Bipolar I disorder, most recent episode (or current) manic (HCC) 06/30/2021  ? Opioid abuse with opioid-induced mood disorder (HCC) 03/31/2021  ? Autonomic dysfunction 09/17/2017  ? Mild headache 09/17/2017  ? Anxiety state 09/17/2017  ? Palpitations 09/17/2017  ? Deliberate self-cutting 07/14/2014  ? 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  Cannabis abuse 07/14/2014  ? Suicide attempt (HCC) 07/14/2014  ? 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 Generalized anxiety disorder 09/15/2013  ? ? ?Past Surgical History:  ?Procedure Laterality Date  ? DILATION AND CURETTAGE OF UTERUS N/A 09/27/2021  ? Procedure: DILATATION AND CURETTAGE WITH FROZEN WITH ULTRA SOUND;  Surgeon: Dunbar Bing, MD;  Location: [facility] OR;  Service: Gynecology;  Laterality: N/A;  ? ? ?OB History   ? ? Gravida  ?1  ? Para  ?   ? Term  ?   ? Preterm  ?   ? AB  ?1  ? Living  ?   ?  ? ? SAB  ?1  ? IAB  ?   ? Ectopic  ?   ? Multiple  ?   ? Live Births  ?   ?   ?  ?  ? ? ? ?Home Medications   ? ?Prior to Admission medications   ?Medication Sig Start Date End Date Taking? Authorizing Provider  ?acetaminophen (TYLENOL) 500 MG tablet Take 1 tablet (500 mg total) by mouth every 6 (six) hours as needed. 09/27/21    Bing, MD  ?albuterol (VENTOLIN HFA) 108 (90 Base) MCG/ACT inhaler Inhale 2 puffs into the lungs every 6 (six) hours as needed for wheezing or shortness of breath. 10/25/20   Hall-Potvin, [place], PA-C  ?doxycycline (VIBRAMYCIN) 100 MG capsule Take 1 capsule (100 mg total) by mouth 2 (two) times daily. 01/05/22   Izola Price,  Sallee Lange, PA-C  ?ibuprofen (ADVIL) 600 MG tablet Take 1 tablet (600 mg total) by mouth every 6 (six) hours as needed for mild pain or moderate pain. 11/20/21   Domenic Moras, PA-C  ? ? ?Family History ?Family History  ?Problem Relation Age of Onset  ? Migraines Neg Hx   ? Parkinsonism Neg Hx   ? Seizures Neg Hx   ? ADD / ADHD Neg Hx   ? Bipolar disorder Neg Hx   ? ? ?Social History ?Social History  ? ?Tobacco Use  ? Smoking status: Never  ? Smokeless tobacco: Never  ?Vaping Use  ? Vaping Use: Every day  ? Substances: Nicotine, Flavoring  ?Substance Use Topics  ? Alcohol use: Not Currently  ?  Comment: liquor - none in last 2 wks as of 09/27/21 per patient  ? Drug use: Yes  ?  Types: Marijuana  ?  Comment: percocet, last use  marijuana 04/2021  ? ? ? ?Allergies   ?Penicillins ? ? ?Review of Systems ?Review of Systems ?Per HPI ? ?Physical Exam ?Triage Vital Signs ?ED Triage Vitals  ?Enc Vitals Group  ?   BP 03/28/22 1622 115/73  ?   Pulse Rate 03/28/22 1622 78  ?   Resp 03/28/22 1622 18  ?   Temp 03/28/22 1622 98.4 ?F (36.9 ?C)  ?   Temp Source 03/28/22 1622 Oral  ?   SpO2 03/28/22 1622 98 %  ?   Weight 03/28/22 1623 180 lb (81.6 kg)  ?   Height 03/28/22 1623 5\' 6"  (1.676 m)  ?   Head Circumference --   ?   Peak Flow --   ?   Pain Score 03/28/22 1623 0  ?   Pain Loc --   ?   Pain Edu? --   ?   Excl. in [place]? --   ? ?No data found. ? ?Updated Vital Signs ?BP 115/73 (BP Location: Left Arm)   Pulse 78   Temp 98.4 ?F (36.9 ?C) (Oral)   Resp 18   Ht 5\' 6"  (1.676 m)   Wt 180 lb (81.6 kg)   LMP 03/28/2022   SpO2 98%   BMI 29.05 kg/m?  ? ?Visual Acuity ?Right Eye Distance:   ?Left Eye Distance:   ?Bilateral Distance:   ? ?Right Eye Near:   ?Left Eye Near:    ?Bilateral Near:    ? ?Physical Exam ?Constitutional:   ?   General: She is not in acute distress. ?   Appearance: Normal appearance. She is not toxic-appearing or diaphoretic.  ?HENT:  ?   Head: Normocephalic and atraumatic.  ?Eyes:  ?   Extraocular Movements: Extraocular movements intact.  ?   Conjunctiva/sclera: Conjunctivae normal.  ?   Pupils: Pupils are equal, round, and reactive to light.  ?Cardiovascular:  ?   Rate and Rhythm: Normal rate and regular rhythm.  ?   Pulses: Normal pulses.  ?   Heart sounds: Normal heart sounds.  ?Pulmonary:  ?   Effort: Pulmonary effort is normal. No respiratory distress.  ?   Breath sounds: Normal breath sounds.  ?Musculoskeletal:  ?   Comments: All fingers are mildly swollen.  No discoloration.  Patient has full range of motion of fingers.  Neurovascular intact.  ?Neurological:  ?   General: No focal deficit present.  ?   Mental Status: She is alert and oriented to person, place, and time. Mental status is at baseline.  ?   Cranial Nerves: Cranial  nerves 2-12 are intact.  ?   Sensory: Sensation is intact.  ?   Motor: Motor function is intact.  ?   Coordination: Coordination is intact.  ?   Gait: Gait is intact.  ?Psychiatric:     ?   Mood and Affect: Mood normal.     ?   Behavior: Behavior normal.     ?   Thought Content: Thought content normal.     ?   Judgment: Judgment normal.  ? ? ? ?UC Treatments / Results  ?Labs ?(all labs ordered are listed, but only abnormal results are displayed) ?Labs Reviewed - No data to display ? ?EKG ? ? ?Radiology ?No results found. ? ?Procedures ?Procedures (including critical care time) ? ?Medications Ordered in UC ?Medications - No data to display ? ?Initial Impression / Assessment and Plan / UC Course  ?I have reviewed the triage vital signs and the nursing notes. ? ?Pertinent labs & imaging results that were available during my care of the patient were reviewed by me and considered in my medical decision making (see chart for details). ? ?  ? ?EKG showing sinus rhythm with sinus arrhythmia which is a change from previous EKG.  Due to persistent and more severe palpitations with associated chest pain and hand swelling, patient was advised to go to the ER for further evaluation and management.  Patient was agreeable with plan.  Vital signs and patient stable at discharge.  Patient does not currently have any chest pain so agree with patient self transport to the hospital. ?Final Clinical Impressions(s) / UC Diagnoses  ? ?Final diagnoses:  ?Other chest pain  ?Bilateral hand swelling  ? ? ? ?Discharge Instructions   ? ?  ?Please go to the ER as soon as you leave urgent care for further evaluation and management. ? ? ? ?ED Prescriptions   ?None ?  ? ?PDMP not reviewed this encounter. ?  ?Teodora Medici, [place] ?03/28/22 1725 ? ?

## 2022-03-28 NOTE — ED Provider Notes (Signed)
?[facility] EMERGENCY DEPARTMENT ?Provider Note ? ? ?CSN: [id number] ?Arrival date & time: 03/28/22  1746 ? ?  ? ?History ? ?Chief Complaint  ?Patient presents with  ? Chest Pain  ? ? ?Hollin Madewell is a 21 y.o. female. ? ?22 year old female with prior medical history as detailed below presents for evaluation.  Patient with reported longstanding issues with palpitations and highly atypical chest discomfort.  Patient was seen by urgent care earlier today.  She was referred to the ED for additional testing. ? ?Patient reports that her symptoms of been ongoing for "months". ? ?She denies significant change in symptoms over the last several days.  She denies fever or shortness of breath.  She denies nausea or vomiting. ? ?The history is provided by the patient and medical records.  ?Chest Pain ?Pain location:  Substernal area (Intermittent "achiness") ?Pain quality: aching   ?Pain radiates to:  Does not radiate ?Pain severity:  Mild ?Onset quality:  Gradual ?Duration:  6 months ?Timing:  Constant ?Progression:  Waxing and waning ?Chronicity:  New ? ?  ? ?Home Medications ?Prior to Admission medications   ?Medication Sig Start Date End Date Taking? Authorizing Provider  ?acetaminophen (TYLENOL) 500 MG tablet Take 1 tablet (500 mg total) by mouth every 6 (six) hours as needed. 09/27/21   Ava Bing, MD  ?albuterol (VENTOLIN HFA) 108 (90 Base) MCG/ACT inhaler Inhale 2 puffs into the lungs every 6 (six) hours as needed for wheezing or shortness of breath. 10/25/20   Hall-Potvin, [place], PA-C  ?doxycycline (VIBRAMYCIN) 100 MG capsule Take 1 capsule (100 mg total) by mouth 2 (two) times daily. 01/05/22   Tomi Bamberger, PA-C  ?ibuprofen (ADVIL) 600 MG tablet Take 1 tablet (600 mg total) by mouth every 6 (six) hours as needed for mild pain or moderate pain. 11/20/21   Fayrene Helper, PA-C  ?   ? ?Allergies    ?Penicillins   ? ?Review of Systems   ?Review of Systems  ?Cardiovascular:  Positive for chest pain.   ?All other systems reviewed and are negative. ? ?Physical Exam ?Updated Vital Signs ?BP 122/64   Pulse 81   Temp 98.3 ?F (36.8 ?C) (Oral)   Resp 16   LMP 03/28/2022   SpO2 100%  ?Physical Exam ?Vitals and nursing note reviewed.  ?Constitutional:   ?   General: She is not in acute distress. ?   Appearance: Normal appearance. She is well-developed.  ?HENT:  ?   Head: Normocephalic and atraumatic.  ?Eyes:  ?   Conjunctiva/sclera: Conjunctivae normal.  ?   Pupils: Pupils are equal, round, and reactive to light.  ?Cardiovascular:  ?   Rate and Rhythm: Normal rate and regular rhythm.  ?   Heart sounds: Normal heart sounds.  ?Pulmonary:  ?   Effort: Pulmonary effort is normal. No respiratory distress.  ?   Breath sounds: Normal breath sounds.  ?Abdominal:  ?   General: There is no distension.  ?   Palpations: Abdomen is soft.  ?   Tenderness: There is no abdominal tenderness.  ?Musculoskeletal:     ?   General: No deformity. Normal range of motion.  ?   Cervical back: Normal range of motion and neck supple.  ?Skin: ?   General: Skin is warm and dry.  ?Neurological:  ?   General: No focal deficit present.  ?   Mental Status: She is alert and oriented to person, place, and time.  ? ? ?ED Results /  Procedures / Treatments   ?Labs ?(all labs ordered are listed, but only abnormal results are displayed) ?Labs Reviewed  ?BASIC METABOLIC PANEL  ?CBC  ?I-STAT BETA HCG BLOOD, ED ([facility], [facility], [facility] ONLY)  ?TROPONIN I (HIGH SENSITIVITY)  ? ? ?EKG ?None ? ?Radiology ?DG Chest 2 View ? ?Result Date: 03/28/2022 ?CLINICAL DATA:  Chest pain EXAM: CHEST - 2 VIEW COMPARISON:  None. FINDINGS: The heart size and mediastinal contours are within normal limits. Both lungs are clear. The visualized skeletal structures are unremarkable. IMPRESSION: No active cardiopulmonary disease. Electronically Signed   By: Helyn Numbers M.D.   On: 03/28/2022 20:11   ? ?Procedures ?Procedures  ? ? ?Medications Ordered in ED ?Medications - No data to display ? ?ED  Course/ Medical Decision Making/ A&P ?  ?                        ?Medical Decision Making ?Amount and/or Complexity of Data Reviewed ?Labs: ordered. ?Radiology: ordered. ? ? ? ?Medical Screen Complete ? ?This patient presented to the ED with complaint of palpitations /atypical chest discomfort ? ?This complaint involves an extensive number of treatment options. The initial differential diagnosis includes, but is not limited to, metabolic abnormality, arrhythmia, etc. ? ?This presentation is: Acute, Chronic, Self-Limited, Previously Undiagnosed, Uncertain Prognosis, Complicated, Systemic Symptoms, and Threat to Life/Bodily Function ? ?Patient is presenting with complaint of highly atypical chest discomfort.  She also reports palpitations. ? ?Symptoms have been ongoing consistently nearly every day for the last 6 months. ? ?Work-up in ED is without evidence of significant acute pathology.  Patient is reassured by ED evaluation. She does understand need for close follow-up both with PCP and also with cardiology. ? ?Additional history obtained: ? ?External records from outside sources obtained and reviewed including prior ED visits and prior Inpatient records.  ? ? ?Lab Tests: ? ?I ordered and personally interpreted labs.  The pertinent results include: CBC, BMP, troponin, hCG ? ? ?Imaging Studies ordered: ? ?I ordered imaging studies including chest x-ray ?I independently visualized and interpreted obtained imaging which showed NAD ?I agree with the radiologist interpretation. ? ? ?Cardiac Monitoring: ? ?The patient was maintained on a cardiac monitor.  I personally viewed and interpreted the cardiac monitor which showed an underlying rhythm of: Normal sinus rhythm ? ?Problem List / ED Course: ? ?Palpitations ? ? ?Reevaluation: ? ?After the interventions noted above, I reevaluated the patient and found that they have: improved ? ? ?Disposition: ? ?After consideration of the diagnostic results and the patients response  to treatment, I feel that the patent would benefit from close outpatient follow up.  ? ? ? ? ? ? ? ? ?Final Clinical Impression(s) / ED Diagnoses ?Final diagnoses:  ?Atypical chest pain  ?Palpitations  ? ? ?Rx / DC Orders ?ED Discharge Orders   ? ? None  ? ?  ? ? ?  ?Wynetta Fines, MD ?03/28/22 2232 ? ?

## 2022-04-09 NOTE — Progress Notes (Deleted)
Cardiology Office Note:   Date:  04/09/2022  NAME:  Ellen Huffman    MRN: [id number] DOB:  December 09, 2001   PCP:  Pcp, No  Cardiologist:  None  Electrophysiologist:  None   Referring MD: No ref. provider found   No chief complaint on file. ***  History of Present Illness:   Ellen Huffman is a 21 y.o. female with a hx of depression who is being seen today for the evaluation of chest pain at the request of Wynetta Fines, MD.  Past Medical History: Past Medical History:  Diagnosis Date   Allergy    Anxiety    Bipolar disorder (HCC)    Depression    Mental disorder    Overdose 12/25/2013    Past Surgical History: Past Surgical History:  Procedure Laterality Date   DILATION AND CURETTAGE OF UTERUS N/A 09/27/2021   Procedure: DILATATION AND CURETTAGE WITH FROZEN WITH ULTRA SOUND;  Surgeon: Wimberley Bing, MD;  Location: [facility] OR;  Service: Gynecology;  Laterality: N/A;    Current Medications: No outpatient medications have been marked as taking for the 04/12/22 encounter (Appointment) with O'Neal, Ronnald Ramp, MD.     Allergies:    Penicillins   Social History: Social History   Socioeconomic History   Marital status: Single    Spouse name: Not on file   Number of children: Not on file   Years of education: Not on file   Highest education level: Not on file  Occupational History   Not on file  Tobacco Use   Smoking status: Never   Smokeless tobacco: Never  Vaping Use   Vaping Use: Every day   Substances: Nicotine, Flavoring  Substance and Sexual Activity   Alcohol use: Not Currently    Comment: liquor - none in last 2 wks as of 09/27/21 per patient   Drug use: Not Currently    Types: Marijuana    Comment: percocet, last use marijuana 04/2021   Sexual activity: Not Currently    Birth control/protection: None  Other Topics Concern   Not on file  Social History Narrative   Grade:11   School Name:[employer]   How does patient do in school: average   Patient  lives with: Dad- his girlfriend and her daughter- mom with her at visit   Does patient have and IEP/504 Plan in school? No      What are the patient's hobbies or interest? Sleep- no hobbies   Social Determinants of Health   Financial Resource Strain: Not on file  Food Insecurity: Not on file  Transportation Needs: Not on file  Physical Activity: Not on file  Stress: Not on file  Social Connections: Not on file     Family History: The patient's ***family history is negative for Migraines, Parkinsonism, Seizures, ADD / ADHD, and Bipolar disorder.  ROS:   All other ROS reviewed and negative. Pertinent positives noted in the HPI.     EKGs/Labs/Other Studies Reviewed:   The following studies were personally reviewed by me today:  EKG:  EKG is *** ordered today.  The ekg ordered today demonstrates ***, and was personally reviewed by me.   Recent Labs: 01/09/2022: ALT 19 03/28/2022: BUN 9; Creatinine, Ser 0.65; Hemoglobin 13.9; Platelets 279; Potassium 3.6; Sodium 138   Recent Lipid Panel    Component Value Date/Time   CHOL 160 12/13/2013 0630   TRIG 49 12/13/2013 0630   HDL 61 12/13/2013 0630   CHOLHDL 2.6 12/13/2013 0630   VLDL  10 12/13/2013 0630   LDLCALC 89 12/13/2013 0630    Physical Exam:   VS:  LMP 03/28/2022    Wt Readings from Last 3 Encounters:  03/28/22 180 lb (81.6 kg)  11/14/21 166 lb 3.2 oz (75.4 kg)  09/27/21 160 lb (72.6 kg)    General: Well nourished, well developed, in no acute distress Head: Atraumatic, normal size  Eyes: PEERLA, EOMI  Neck: Supple, no JVD Endocrine: No thryomegaly Cardiac: Normal S1, S2; RRR; no murmurs, rubs, or gallops Lungs: Clear to auscultation bilaterally, no wheezing, rhonchi or rales  Abd: Soft, nontender, no hepatomegaly  Ext: No edema, pulses 2+ Musculoskeletal: No deformities, BUE and BLE strength normal and equal Skin: Warm and dry, no rashes   Neuro: Alert and oriented to person, place, time, and situation, CNII-XII  grossly intact, no focal deficits  Psych: Normal mood and affect   ASSESSMENT:   Ellen Huffman is a 21 y.o. female who presents for the following: No diagnosis found.  PLAN:   There are no diagnoses linked to this encounter.  {Are you ordering a CV Procedure (e.g. stress test, cath, DCCV, TEE, etc)?   Press F2        :[id number]}  Disposition: No follow-ups on file.  Medication Adjustments/Labs and Tests Ordered: Current medicines are reviewed at length with the patient today.  Concerns regarding medicines are outlined above.  No orders of the defined types were placed in this encounter.  No orders of the defined types were placed in this encounter.   There are no Patient Instructions on file for this visit.   Time Spent with Patient: I have spent a total of *** minutes with patient reviewing hospital notes, telemetry, EKGs, labs and examining the patient as well as establishing an assessment and plan that was discussed with the patient.  > 50% of time was spent in direct patient care.  Signed, Lenna Gilford. Flora Lipps, MD, [facility]  [facility]  [address] [place], [place] [address] [phone]  04/09/2022 6:09 PM

## 2022-04-12 ENCOUNTER — Ambulatory Visit: Payer: Medicaid Other | Admitting: Cardiovascular Disease

## 2022-04-12 DIAGNOSIS — R072 Precordial pain: Secondary | ICD-10-CM

## 2022-05-03 NOTE — Progress Notes (Signed)
?Cardiology Office Note:   ? ?Date:  05/04/2022  ? ?ID:  Ellen Huffman, DOB 05/20/2001, MRN [id number] ? ?PCP:  Pcp, No ?  ?[facility] Providers ?Cardiologist:  Maisie FusBranch, Mckyla Deckman E, MD    ? ?Referring MD: Wynetta FinesMessick, Peter C, MD  ? ?No chief complaint on file. ?Atypical cp and palpitations ? ?History of Present Illness:   ? ?Ellen Huffman is a 21 y.o. female with a hx below, went to the ED 03/28/2022 for palpitations and atypical chest pain that's been long stading. ? ?She has had persistent aching of her L chest for 6 months. She notes L arm swelling. Patient was referred to cardiology. She notes palpitations. She hasn't used her albuterol. She drinks etoh.  No syncope. She notes them, not significant.  No anemia ? ?Past Medical History:  ?Diagnosis Date  ? Allergy   ? Anxiety   ? Bipolar disorder (HCC)   ? Depression   ? Mental disorder   ? Overdose 12/25/2013  ? ? ?Past Surgical History:  ?Procedure Laterality Date  ? DILATION AND CURETTAGE OF UTERUS N/A 09/27/2021  ? Procedure: DILATATION AND CURETTAGE WITH FROZEN WITH ULTRA SOUND;  Surgeon: [place] BingPickens, Charlie, MD;  Location: [facility] OR;  Service: Gynecology;  Laterality: N/A;  ? ? ?Current Medications: ?Current Meds  ?Medication Sig  ? acetaminophen (TYLENOL) 500 MG tablet Take 1 tablet (500 mg total) by mouth every 6 (six) hours as needed.  ? albuterol (VENTOLIN HFA) 108 (90 Base) MCG/ACT inhaler Inhale 2 puffs into the lungs every 6 (six) hours as needed for wheezing or shortness of breath.  ? ibuprofen (ADVIL) 600 MG tablet Take 1 tablet (600 mg total) by mouth every 6 (six) hours as needed for mild pain or moderate pain.  ?  ? ?Allergies:   Penicillins  ? ?Social History  ? ?Socioeconomic History  ? Marital status: Single  ?  Spouse name: Not on file  ? Number of children: Not on file  ? Years of education: Not on file  ? Highest education level: Not on file  ?Occupational History  ? Not on file  ?Tobacco Use  ? Smoking status: Never  ? Smokeless tobacco: Never  ?Vaping Use  ?  Vaping Use: Every day  ? Substances: Nicotine, Flavoring  ?Substance and Sexual Activity  ? Alcohol use: Not Currently  ?  Comment: liquor - none in last 2 wks as of 09/27/21 per patient  ? Drug use: Not Currently  ?  Types: Marijuana  ?  Comment: percocet, last use marijuana 04/2021  ? Sexual activity: Not Currently  ?  Birth control/protection: None  ?Other Topics Concern  ? Not on file  ?Social History Narrative  ? Grade:11  ? School Name:[employer]  ? How does patient do in school: average  ? Patient lives with: Dad- his girlfriend and her daughter- mom with her at visit  ? Does patient have and IEP/504 Plan in school? No  ?   ? What are the patient's hobbies or interest? Sleep- no hobbies  ? ?Social Determinants of Health  ? ?Financial Resource Strain: Not on file  ?Food Insecurity: Not on file  ?Transportation Needs: Not on file  ?Physical Activity: Not on file  ?Stress: Not on file  ?Social Connections: Not on file  ?  ? ?Family History: ?The patient's family history is negative for Migraines, Parkinsonism, Seizures, ADD / ADHD, and Bipolar disorder. No heart disease ? ?ROS:   ?Please see the history of present illness.    ?  All other systems reviewed and are negative. ? ?EKGs/Labs/Other Studies Reviewed:   ? ?The following studies were reviewed today: ? ? ?EKG:  EKG is  ordered today.  The ekg ordered today demonstrates  ? ?05/04/2022-NSR, with sinus arrhythmia, poor r wave progression ? ?Recent Labs: ?01/09/2022: ALT 19 ?03/28/2022: BUN 9; Creatinine, Ser 0.65; Hemoglobin 13.9; Platelets 279; Potassium 3.6; Sodium 138  ? ?Recent Lipid Panel ?   ?Component Value Date/Time  ? CHOL 160 12/13/2013 0630  ? TRIG 49 12/13/2013 0630  ? HDL 61 12/13/2013 0630  ? CHOLHDL 2.6 12/13/2013 0630  ? VLDL 10 12/13/2013 0630  ? LDLCALC 89 12/13/2013 0630  ? ? ? ?Risk Assessment/Calculations:   ?  ? ?    ? ?Physical Exam:   ? ?VS:   ?Vitals:  ? 05/04/22 0957  ?BP: 120/68  ?Pulse: 71  ? ? ? ?Wt Readings from Last 3 Encounters:   ?05/04/22 188 lb 12.8 oz (85.6 kg)  ?03/28/22 180 lb (81.6 kg)  ?11/14/21 166 lb 3.2 oz (75.4 kg)  ?  ? ?GEN:  Well nourished, well developed in no acute distress ?HEENT: Normal ?NECK: No JVD; No carotid bruits ?LYMPHATICS: No lymphadenopathy ?CARDIAC: RRR, no murmurs, rubs, gallops ?RESPIRATORY:  Clear to auscultation without rales, wheezing or rhonchi  ?ABDOMEN: Soft, non-tender, non-distended ?MUSCULOSKELETAL:  No edema; No deformity  ?SKIN: Warm and dry ?NEUROLOGIC:  Alert and oriented x 3 ?PSYCHIATRIC:  Normal affect  ? ?ASSESSMENT:   ? ?Atypical CP:The nature of the pain is very atypical. Her EKG shows poor r wave progression seen on prior ekgs, could be lead placement. Don't suspect she's had an MI.  Her cardiac disease risk is low. Considering the low pretest probability and atypical pain will not recommend further testing. ? ?Palpitations: She does not have high risk features including syncope c/f arrhythmia , family hx of SCD, or abnormalities on her EKGs.   We discussed cutting back on alcohol. ? ? ?PLAN:   ? ?In order of problems listed above: ? ?No plans for further evaluation ? ?   ? ? ?Medication Adjustments/Labs and Tests Ordered: ?Current medicines are reviewed at length with the patient today.  Concerns regarding medicines are outlined above.  ?Orders Placed This Encounter  ?Procedures  ? EKG 12-Lead  ? ?No orders of the defined types were placed in this encounter. ? ? ?Patient Instructions  ?Medication Instructions:  ?Your Physician recommend you continue on your current medication as directed.   ? ?*If you need a refill on your cardiac medications before your next appointment, please call your pharmacy* ? ? ?Lab Work: ?None ordered today ? ? ?Testing/Procedures: ?None ordered today ? ? ?Follow-Up: ?At [facility], you and your health needs are our priority.  As part of our continuing mission to provide you with exceptional heart care, we have created designated Provider Care Teams.  These  Care Teams include your primary Cardiologist (physician) and Advanced Practice Providers (APPs -  Physician Assistants and Nurse Practitioners) who all work together to provide you with the care you need, when you need it. ? ?We recommend signing up for the patient portal called "MyChart".  Sign up information is provided on this After Visit Summary.  MyChart is used to connect with patients for Virtual Visits (Telemedicine).  Patients are able to view lab/test results, encounter notes, upcoming appointments, etc.  Non-urgent messages can be sent to your provider as well.   ?To learn more about what you can do with MyChart,  go to [email or web address].   ? ?Your next appointment:   ?As needed ? ?The format for your next appointment:   ?In Person ? ?Provider:   ?Maisie Fus, MD { ? ? ?Important Information About Sugar ? ? ? ? ? ?  ? ?Signed, ?Maisie Fus, MD  ?05/04/2022 10:13 AM    ?[facility] ?

## 2022-05-04 ENCOUNTER — Ambulatory Visit (INDEPENDENT_AMBULATORY_CARE_PROVIDER_SITE_OTHER): Payer: Medicaid Other | Admitting: Internal Medicine

## 2022-05-04 ENCOUNTER — Encounter: Payer: Self-pay | Admitting: Internal Medicine

## 2022-05-04 VITALS — BP 120/68 | HR 71 | Ht 66.0 in | Wt 188.8 lb

## 2022-05-04 DIAGNOSIS — R079 Chest pain, unspecified: Secondary | ICD-10-CM | POA: Diagnosis not present

## 2022-05-04 NOTE — Patient Instructions (Signed)
Medication Instructions:  ?Your Physician recommend you continue on your current medication as directed.   ? ?*If you need a refill on your cardiac medications before your next appointment, please call your pharmacy* ? ? ?Lab Work: ?None ordered today ? ? ?Testing/Procedures: ?None ordered today ? ? ?Follow-Up: ?At [facility], you and your health needs are our priority.  As part of our continuing mission to provide you with exceptional heart care, we have created designated Provider Care Teams.  These Care Teams include your primary Cardiologist (physician) and Advanced Practice Providers (APPs -  Physician Assistants and Nurse Practitioners) who all work together to provide you with the care you need, when you need it. ? ?We recommend signing up for the patient portal called "MyChart".  Sign up information is provided on this After Visit Summary.  MyChart is used to connect with patients for Virtual Visits (Telemedicine).  Patients are able to view lab/test results, encounter notes, upcoming appointments, etc.  Non-urgent messages can be sent to your provider as well.   ?To learn more about what you can do with MyChart, go to [email or web address].   ? ?Your next appointment:   ?As needed ? ?The format for your next appointment:   ?In Person ? ?Provider:   ?Branch, Mary E, MD { ? ? ?Important Information About Sugar ? ? ? ? ? ? ?

## 2022-05-29 ENCOUNTER — Ambulatory Visit: Payer: Medicaid Other | Admitting: Nurse Practitioner

## 2022-07-18 IMAGING — US US OB TRANSVAGINAL
1 series · 15 of 28 positions shown · non-contrast
Comparison: 09/25/2021, 09/23/2021

CLINICAL DATA: Pregnancy of unknown location. Rising serum beta HCG

EXAM:
TRANSVAGINAL OB ULTRASOUND
TECHNIQUE: Transvaginal ultrasound was performed for complete evaluation of the
gestation as well as the maternal uterus, adnexal regions, and
pelvic cul-de-sac.

[Series 1: us ob transvaginal · 15 of 42 slices shown]
[im 1/42]
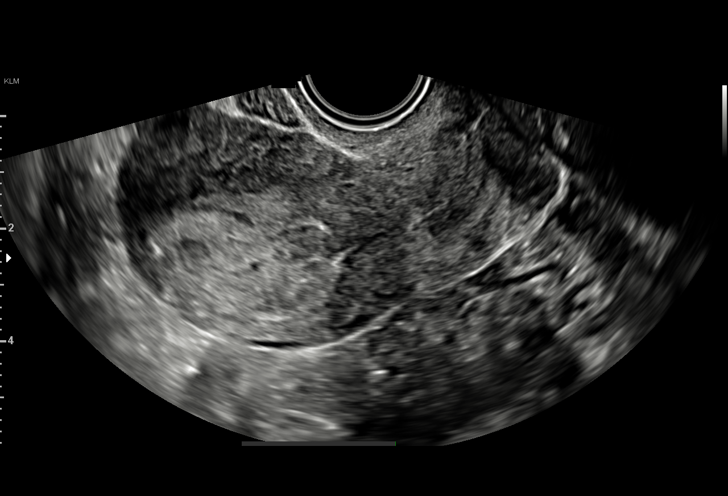
[im 4/42]
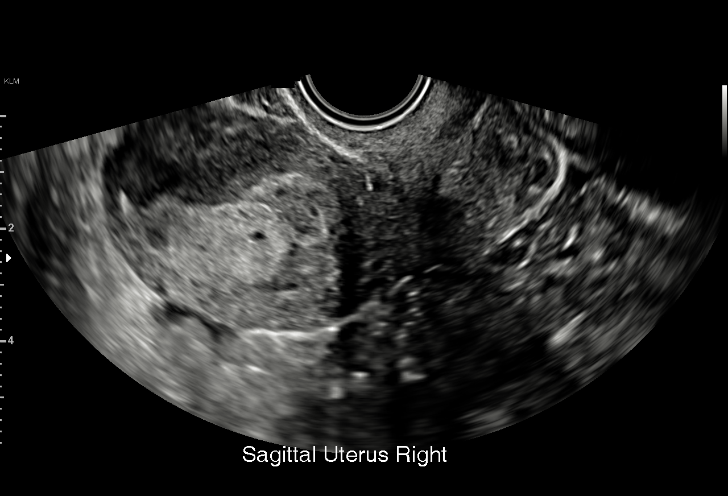
[im 7/42]
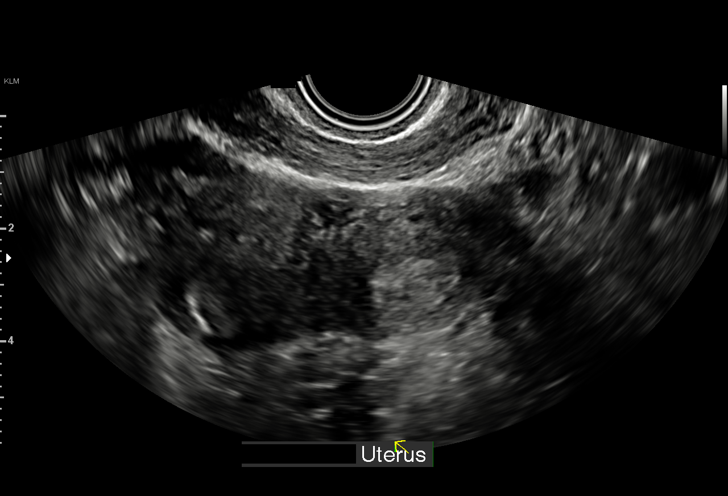
[im 10/42]
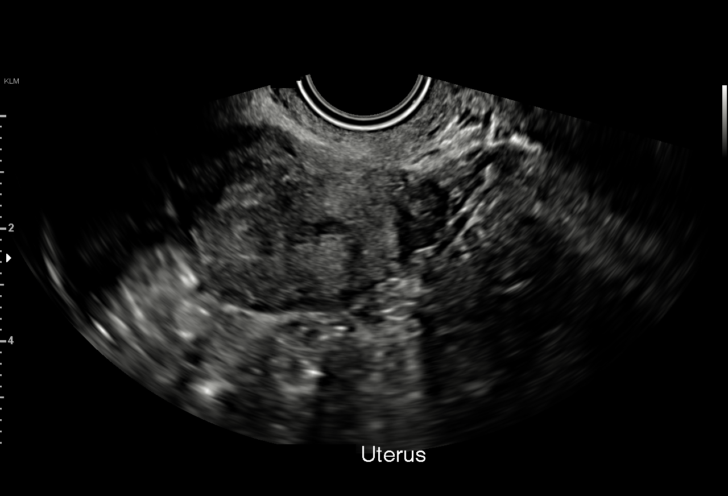
[im 13/42]
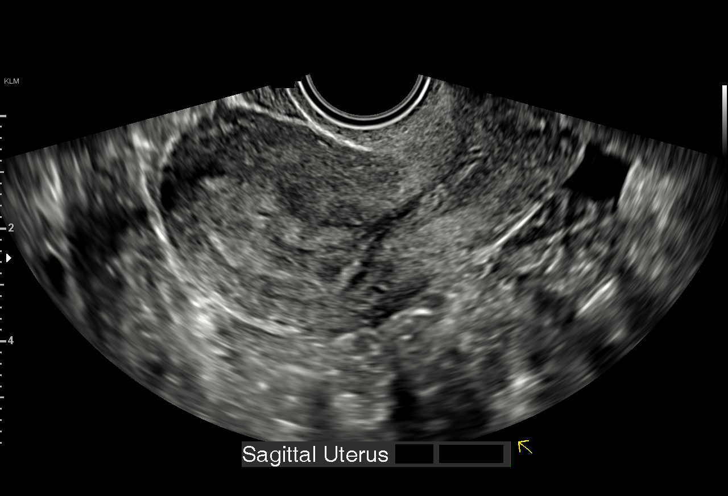
[im 16/42]
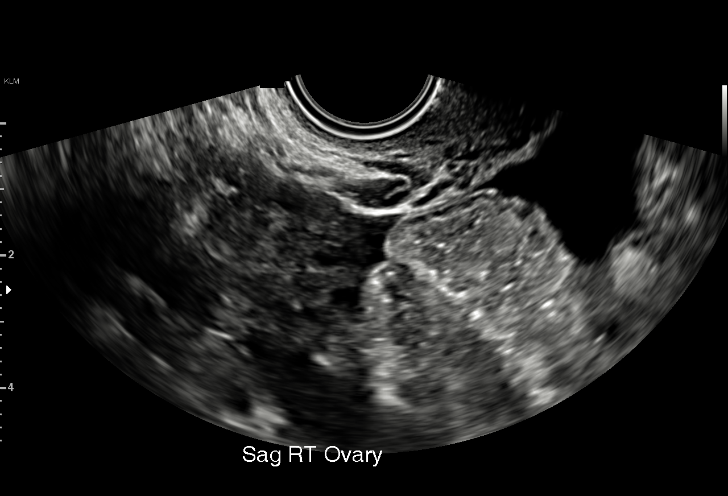
[im 19/42]
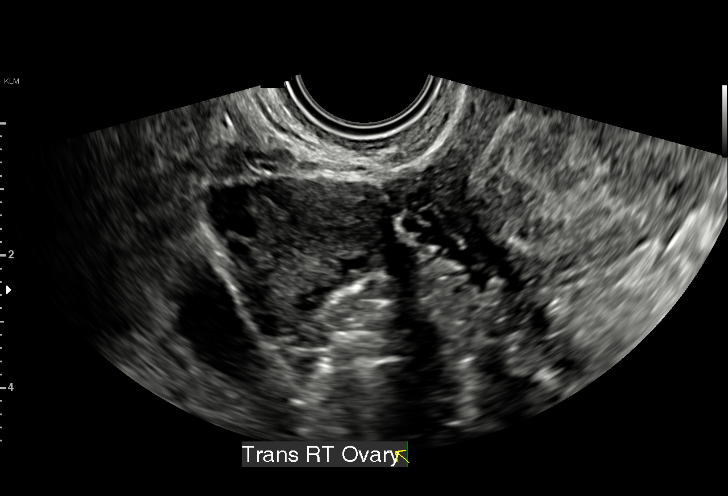
[im 22/42]
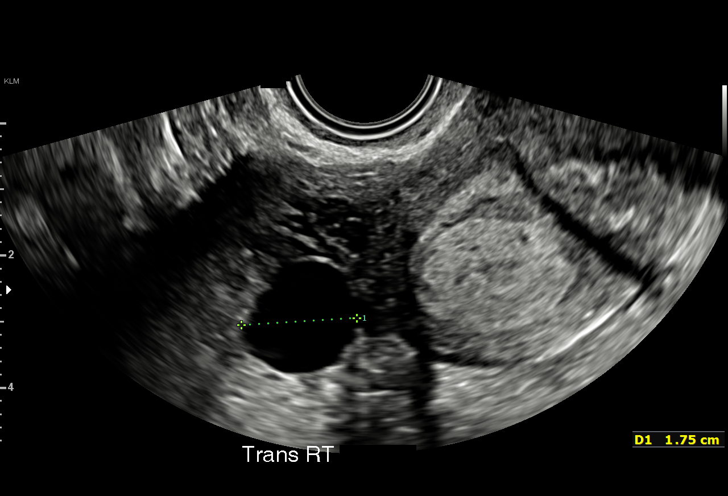
[im 23/42]
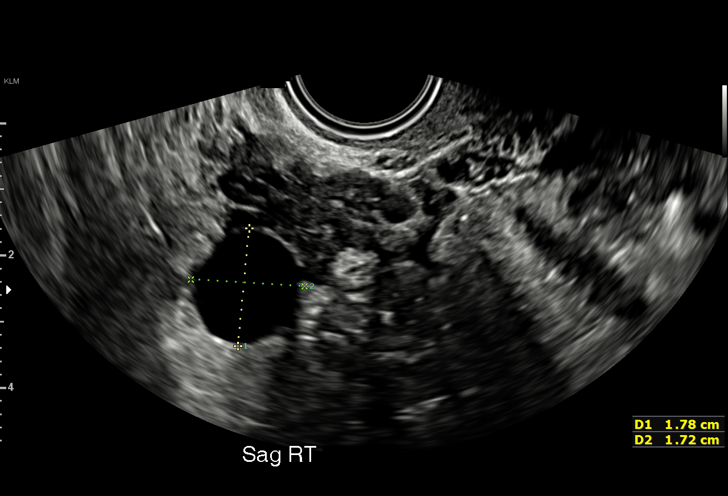
[im 26/42]
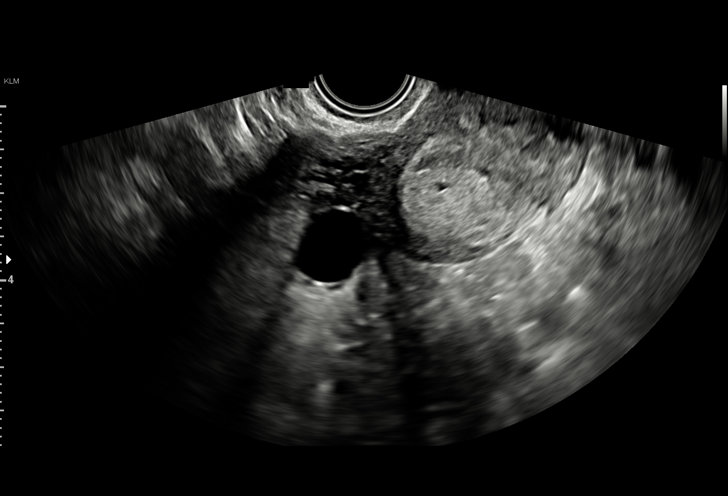
[im 29/42]
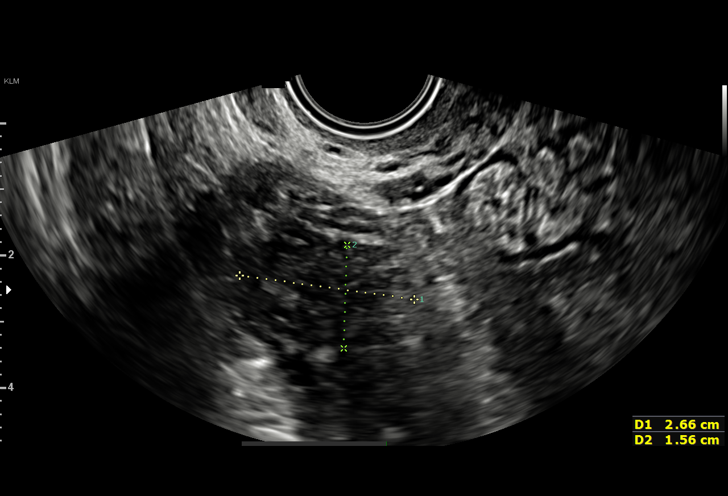
[im 32/42]
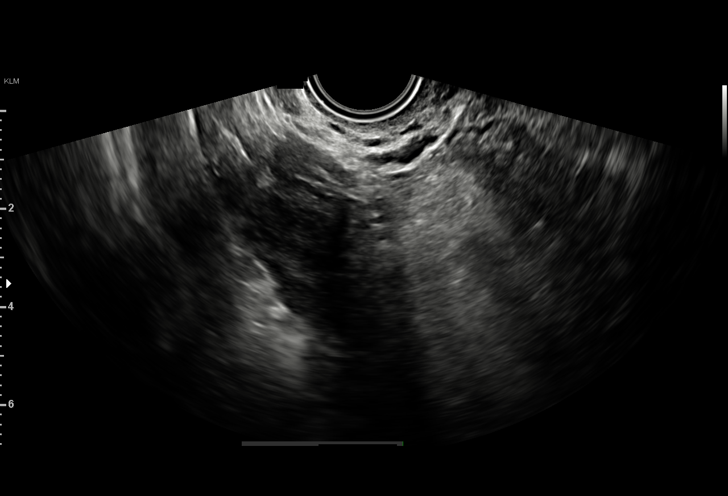
[im 35/42]
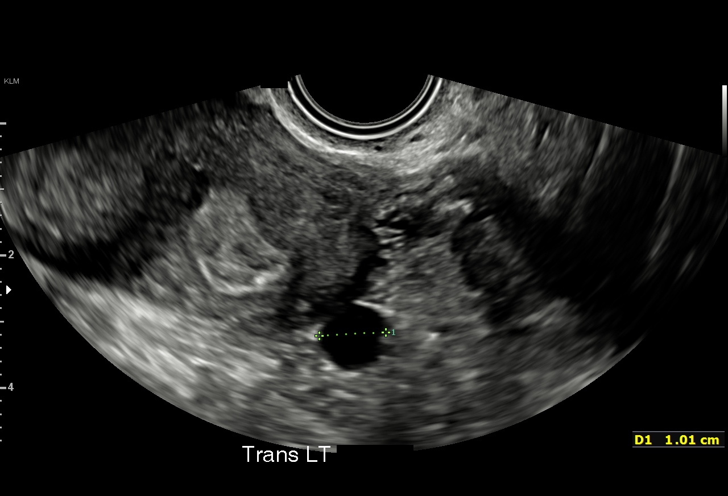
[im 38/42]
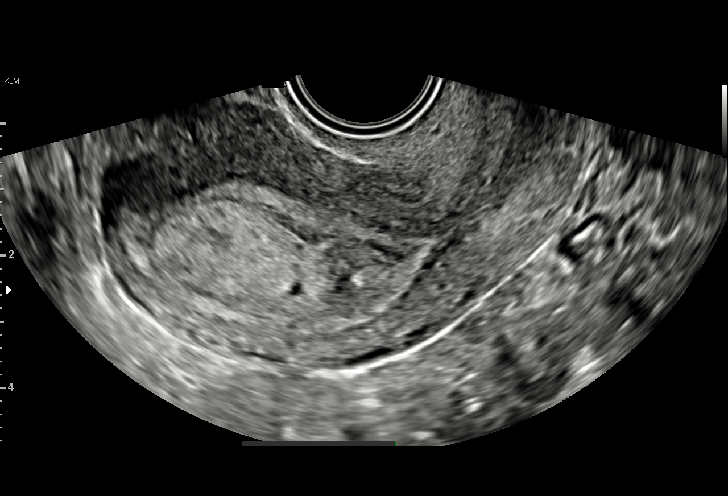
[im 42/42]
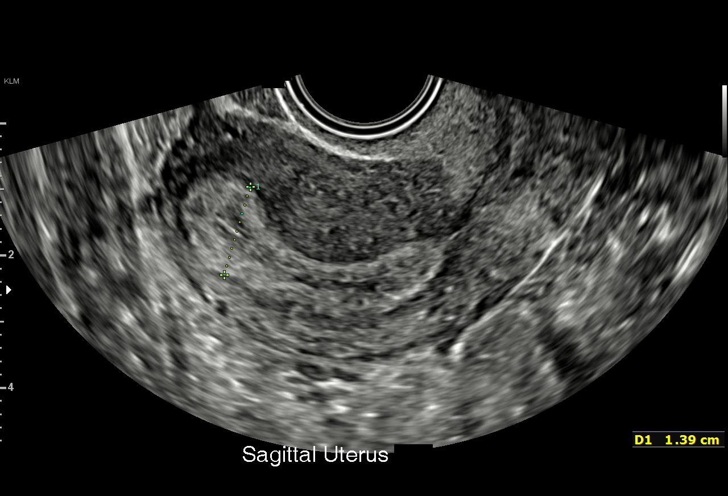

[15 of 28 positions shown; findings below may reference images not displayed]

FINDINGS: Intrauterine gestational sac: None

Yolk sac:  Not Visualized.

Embryo:  Not Visualized.

Cardiac Activity: Not Visualized.

Subchorionic hemorrhage:  None visualized.

Maternal uterus/adnexae: Redemonstrated bicornuate uterus without
gestational sac and either horn. Thickening of the bilateral
endometrial stripes is more prevalent on the right. Stable 1.8 cm
right adnexal cyst. Right ovary appears otherwise unremarkable.
Exophytic 1.0 cm left adnexal cyst. Left ovary is otherwise
unremarkable. No solid adnexal masses. No free fluid within the
pelvis.
IMPRESSION: No intrauterine gestational sac identified. No adnexal mass is
visualized. Findings remain compatible with pregnancy of unknown
location. Continued close clinical follow-up with serial
quantitative beta HCG and short-term follow-up ultrasound is
recommended.

## 2022-07-26 ENCOUNTER — Emergency Department
Admission: EM | Admit: 2022-07-26 | Discharge: 2022-07-26 | Disposition: A | Discharge status: Home / Self Care | Payer: Medicaid Other | Attending: Emergency Medicine | Admitting: Emergency Medicine

## 2022-07-26 ENCOUNTER — Other Ambulatory Visit: Payer: Self-pay

## 2022-07-26 DIAGNOSIS — S161XXA Strain of muscle, fascia and tendon at neck level, initial encounter: Secondary | ICD-10-CM | POA: Insufficient documentation

## 2022-07-26 DIAGNOSIS — S199XXA Unspecified injury of neck, initial encounter: Secondary | ICD-10-CM | POA: Diagnosis present

## 2022-07-26 DIAGNOSIS — Y92410 Unspecified street and highway as the place of occurrence of the external cause: Secondary | ICD-10-CM | POA: Insufficient documentation

## 2022-07-26 MED ORDER — CYCLOBENZAPRINE HCL 5 MG PO TABS: 5.0000 mg | ORAL_TABLET | Freq: Three times a day (TID) | ORAL | 0 refills | Status: DC | PRN

## 2022-07-26 MED ORDER — MELOXICAM 15 MG PO TABS: 15.0000 mg | ORAL_TABLET | Freq: Every day | ORAL | 0 refills | Status: DC

## 2022-07-26 MED ORDER — CYCLOBENZAPRINE HCL 10 MG PO TABS
10.0000 mg | ORAL_TABLET | Freq: Once | ORAL | Status: AC
Start: 2022-07-26 — End: 2022-07-26
  Administered 2022-07-26: 10 mg via ORAL
  Filled 2022-07-26: qty 1
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
## 2022-07-26 NOTE — Discharge Instructions (Signed)
Please take medications as prescribed as needed.  Return to the ER for any severe increasing pain, weakness, or urgent changes in your health.  Follow-up with PCP or orthopedics in 1 to 2 weeks if no improvement
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
## 2022-08-03 ENCOUNTER — Ambulatory Visit: Payer: Medicaid Other | Admitting: Family Medicine

## 2022-09-10 IMAGING — DX DG FOOT COMPLETE 3+V*L*
3 series · 3 of 3 positions shown · non-contrast
Comparison: None.

CLINICAL DATA: Swelling and bruising, posttraumatic

EXAM:
LEFT FOOT - COMPLETE 3+ VIEW

[foot supine dp]
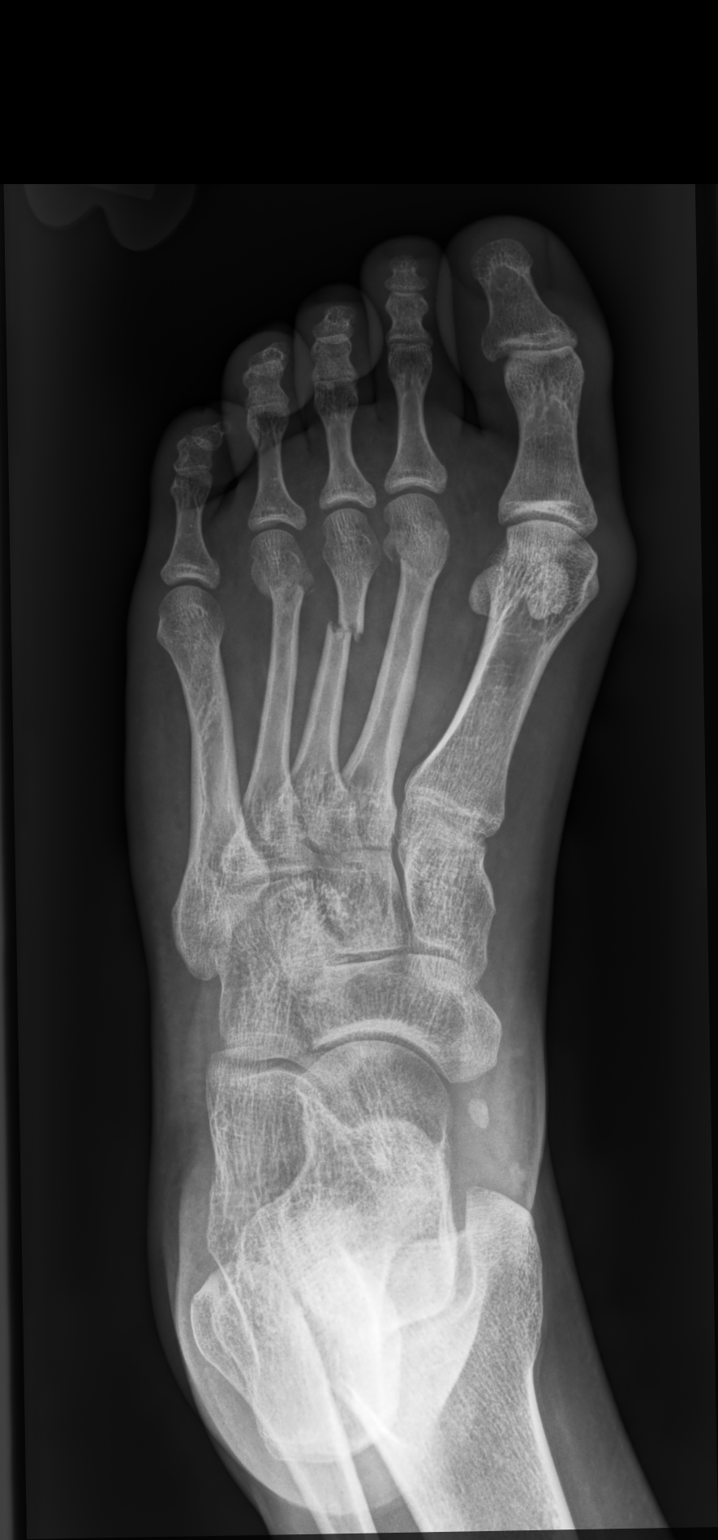

[foot medial oblique]
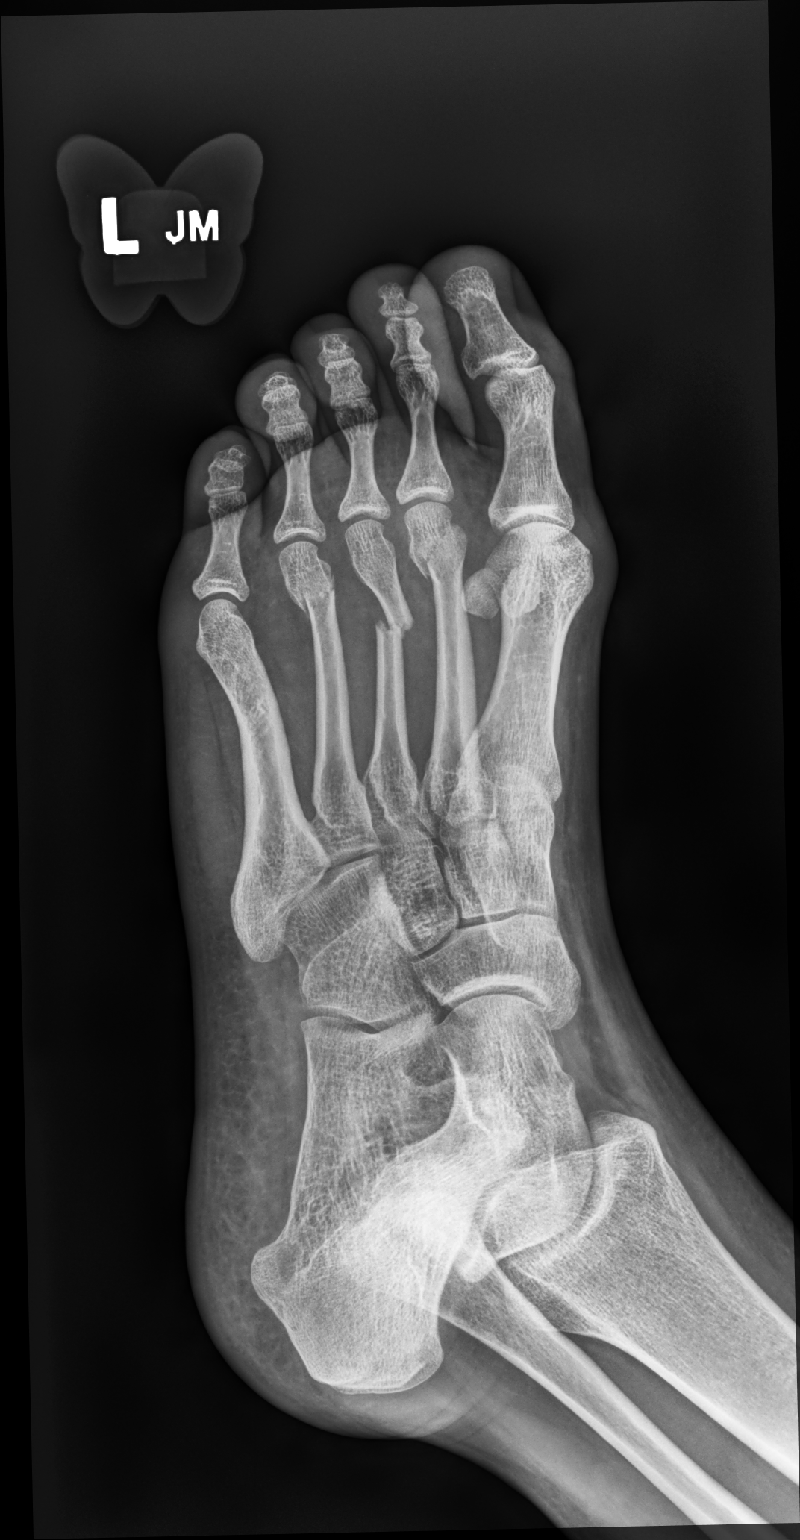

[foot supine lat]
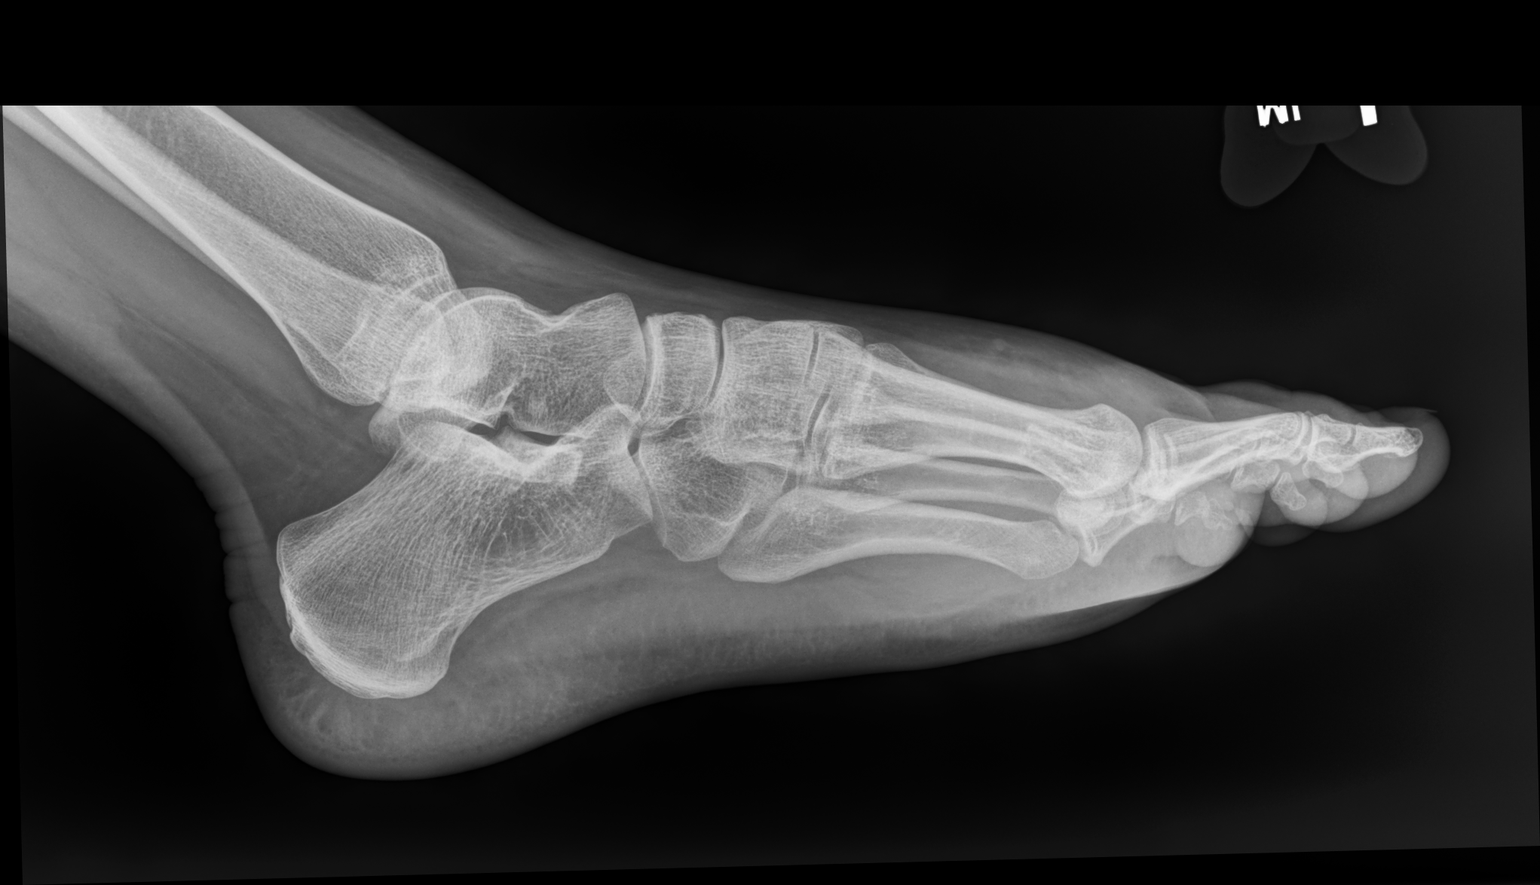

[3 of 3 positions shown; findings below may reference images not displayed]

FINDINGS: Second and fourth metatarsal neck fractures, essentially
nondisplaced. Third metatarsal shaft fracture. The shaft fracture is
displaced up to 2 mm with angulation. No dislocation.
IMPRESSION: Second through fourth metatarsal fractures as described.

## 2022-11-12 ENCOUNTER — Inpatient Hospital Stay (HOSPITAL_COMMUNITY)
Admission: AD | Admit: 2022-11-12 | Discharge: 2022-11-12 | Disposition: A | Discharge status: Home / Self Care | Payer: Medicaid Other | Attending: Obstetrics & Gynecology | Admitting: Obstetrics & Gynecology

## 2022-11-12 ENCOUNTER — Encounter (HOSPITAL_COMMUNITY): Payer: Self-pay | Admitting: *Deleted

## 2022-11-12 ENCOUNTER — Inpatient Hospital Stay (HOSPITAL_COMMUNITY): Payer: Medicaid Other

## 2022-11-12 DIAGNOSIS — Q513 Bicornate uterus: Secondary | ICD-10-CM | POA: Insufficient documentation

## 2022-11-12 DIAGNOSIS — Z3A01 Less than 8 weeks gestation of pregnancy: Secondary | ICD-10-CM | POA: Insufficient documentation

## 2022-11-12 DIAGNOSIS — R1030 Lower abdominal pain, unspecified: Secondary | ICD-10-CM | POA: Diagnosis present

## 2022-11-12 DIAGNOSIS — O3680X0 Pregnancy with inconclusive fetal viability, not applicable or unspecified: Secondary | ICD-10-CM

## 2022-11-12 DIAGNOSIS — O3401 Maternal care for unspecified congenital malformation of uterus, first trimester: Secondary | ICD-10-CM | POA: Insufficient documentation

## 2022-11-12 DIAGNOSIS — O26891 Other specified pregnancy related conditions, first trimester: Secondary | ICD-10-CM | POA: Diagnosis present

## 2022-11-12 LAB — CBC
HCT: 44.2 % (ref 36.0–46.0)
Hemoglobin: 15.4 g/dL — ABNORMAL HIGH (ref 12.0–15.0)
MCH: 32.1 pg (ref 26.0–34.0)
MCHC: 34.8 g/dL (ref 30.0–36.0)
MCV: 92.1 fL (ref 80.0–100.0)
Platelets: 289 10*3/uL (ref 150–400)
RBC: 4.8 MIL/uL (ref 3.87–5.11)
RDW: 12.3 % (ref 11.5–15.5)
WBC: 7.2 10*3/uL (ref 4.0–10.5)
nRBC: 0 % (ref 0.0–0.2)

## 2022-11-12 LAB — WET PREP, GENITAL
Clue Cells Wet Prep HPF POC: NONE SEEN
Trich, Wet Prep: NONE SEEN
WBC, Wet Prep HPF POC: <10 (ref <10)
Yeast Wet Prep HPF POC: NONE SEEN

## 2022-11-12 LAB — POCT PREGNANCY, URINE: Preg Test, Ur: POSITIVE — AB

## 2022-11-12 LAB — URINALYSIS, ROUTINE W REFLEX MICROSCOPIC
Bilirubin Urine: NEGATIVE
Glucose, UA: NEGATIVE mg/dL
Hgb urine dipstick: NEGATIVE
Ketones, ur: NEGATIVE mg/dL
Leukocytes,Ua: NEGATIVE
Nitrite: NEGATIVE
Protein, ur: NEGATIVE mg/dL
Specific Gravity, Urine: 1.021 (ref 1.005–1.030)
pH: 7 (ref 5.0–8.0)

## 2022-11-12 LAB — HCG, QUANTITATIVE, PREGNANCY: hCG, Beta Chain, Quant, S: 98 m[IU]/mL — ABNORMAL HIGH (ref <5)

## 2022-11-12 NOTE — MAU Note (Addendum)
Ellen Huffman is a 21 y.o. at Unknown here in MAU reporting: was supposed to start period like 6-7 days ago.  Stomach was cramping, thought her period was going to start, but it never did.  Cramping has continued.  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  Several +HPT.  No bleeding.  LMP: 10/12 Onset of complaint: wk ago Pain score: 3 Vitals:   11/12/22 1515  BP: 128/72  Pulse: 74  Resp: 16  Temp: 98.2 F (36.8 C)  SpO2: 100%      Lab orders placed from triage:  upt/UA

## 2022-11-12 NOTE — MAU Provider Note (Signed)
History     CSN: [id number]  Arrival date and time: 11/12/22 1448   Event Date/Time   First Provider Initiated Contact with Patient 11/12/22 1637      Chief Complaint  Patient presents with   Abdominal Pain   Possible Pregnancy   HPI Ellen Huffman is a 21 y.o. G2P0010 at [redacted]w[redacted]d who presents to MAU with chief complaint of lower abdominal cramping in the setting of positive home pregnancy test. Patient's pain is generalized to her lower abdomen, onset about one week ago. She states she usually has one day of crampy pain before the onset of her menstrual cycle so she waited for her bleeding to start but it never did. Pain score is 3/10. She has not experienced any severe pain since onset of complaint. She denies vaginal bleeding, dysuria, fever or recent illness.  OB History     Gravida  2   Para      Term      Preterm      AB  1   Living         SAB  1   IAB      Ectopic      Multiple      Live Births              Past Medical History:  Diagnosis Date   Allergy    Anxiety    Bipolar disorder (HCC)    Depression    Mental disorder    Overdose 12/25/2013   Seizures (HCC)    drug withdrawl    Past Surgical History:  Procedure Laterality Date   DILATION AND CURETTAGE OF UTERUS N/A 09/27/2021   Procedure: DILATATION AND CURETTAGE WITH FROZEN WITH ULTRA SOUND;  Surgeon: [place] Bing, MD;  Location: [facility];  Service: Gynecology;  Laterality: N/A;    Family History  Problem Relation Age of Onset   Lupus Mother    Hyperlipidemia Father    Migraines Neg Hx    Parkinsonism Neg Hx    Seizures Neg Hx    ADD / ADHD Neg Hx    Bipolar disorder Neg Hx     Social History   Tobacco Use   Smoking status: Former    Types: Cigarettes   Smokeless tobacco: Never   Tobacco comments:    Quit Jan 2023    Tying to quit vaping, is cutting back  Vaping Use   Vaping Use: Some days   Substances: Nicotine, Flavoring  Substance Use Topics   Alcohol use: Yes     Comment: drinks on the weekends   Drug use: Not Currently    Types: Marijuana    Comment: percocet, last use marijuana 04/2021    Allergies:  Allergies  Allergen Reactions   Penicillins Rash    Medications Prior to Admission  Medication Sig Dispense Refill Last Dose   acetaminophen (TYLENOL) 500 MG tablet Take 1 tablet (500 mg total) by mouth every 6 (six) hours as needed. 30 tablet 0 More than a month   albuterol (VENTOLIN HFA) 108 (90 Base) MCG/ACT inhaler Inhale 2 puffs into the lungs every 6 (six) hours as needed for wheezing or shortness of breath. 8 g 2 More than a month   cyclobenzaprine (FLEXERIL) 5 MG tablet Take 1-2 tablets (5-10 mg total) by mouth 3 (three) times daily as needed for muscle spasms. 20 tablet 0    ibuprofen (ADVIL) 600 MG tablet Take 1 tablet (600 mg total) by mouth every 6 (six) hours  as needed for mild pain or moderate pain. 30 tablet 0    meloxicam (MOBIC) 15 MG tablet Take 1 tablet (15 mg total) by mouth daily. 30 tablet 0     Review of Systems  Gastrointestinal:  Positive for abdominal pain.  All other systems reviewed and are negative.  Physical Exam   Blood pressure 128/72, pulse 74, temperature 98.2 F (36.8 C), temperature source Oral, resp. rate 16, height 5\' 6"  (1.676 m), weight 86.6 kg, last menstrual period 10/05/2022, SpO2 100 %.  Physical Exam Vitals and nursing note reviewed. Exam conducted with a chaperone present.  Constitutional:      Appearance: She is well-developed. She is not ill-appearing.  Cardiovascular:     Rate and Rhythm: Normal rate.  Pulmonary:     Effort: Pulmonary effort is normal.  Abdominal:     Tenderness: There is no abdominal tenderness.  Neurological:     Mental Status: She is alert.     MAU Course  Procedures  MDM Orders Placed This Encounter  Procedures   Wet prep, genital   12/05/2022 OB Transvaginal   Urinalysis, Routine w reflex microscopic Urine, Clean Catch   hCG, quantitative, pregnancy   CBC    Pregnancy, urine POC   Discharge patient   Patient Vitals for the past 24 hrs:  BP Temp Temp src Pulse Resp SpO2 Height Weight  11/12/22 1700 131/68 -- -- 75 -- -- -- --  11/12/22 1515 128/72 98.2 F (36.8 C) Oral 74 16 100 % 5\' 6"  (1.676 m) 86.6 kg   Results for orders placed or performed during the hospital encounter of 11/12/22 (from the past 24 hour(s))  Urinalysis, Routine w reflex microscopic Urine, Clean Catch     Status: None   Collection Time: 11/12/22  3:01 PM  Result Value Ref Range   Color, Urine YELLOW YELLOW   APPearance CLEAR CLEAR   Specific Gravity, Urine 1.021 1.005 - 1.030   pH 7.0 5.0 - 8.0   Glucose, UA NEGATIVE NEGATIVE mg/dL   Hgb urine dipstick NEGATIVE NEGATIVE   Bilirubin Urine NEGATIVE NEGATIVE   Ketones, ur NEGATIVE NEGATIVE mg/dL   Protein, ur NEGATIVE NEGATIVE mg/dL   Nitrite NEGATIVE NEGATIVE   Leukocytes,Ua NEGATIVE NEGATIVE  Pregnancy, urine POC     Status: Abnormal   Collection Time: 11/12/22  3:04 PM  Result Value Ref Range   Preg Test, Ur POSITIVE (A) NEGATIVE  hCG, quantitative, pregnancy     Status: Abnormal   Collection Time: 11/12/22  3:35 PM  Result Value Ref Range   hCG, Beta Chain, Quant, S 98 (H) <5 mIU/mL  CBC     Status: Abnormal   Collection Time: 11/12/22  3:35 PM  Result Value Ref Range   WBC 7.2 4.0 - 10.5 K/uL   RBC 4.80 3.87 - 5.11 MIL/uL   Hemoglobin 15.4 (H) 12.0 - 15.0 g/dL   HCT 11/14/22 11/14/22 - 54.0 %   MCV 92.1 80.0 - 100.0 fL   MCH 32.1 26.0 - 34.0 pg   MCHC 34.8 30.0 - 36.0 g/dL   RDW 08.6 76.1 - 95.0 %   Platelets 289 150 - 400 K/uL   nRBC 0.0 0.0 - 0.2 %  Wet prep, genital     Status: None   Collection Time: 11/12/22  3:41 PM   Specimen: Vaginal  Result Value Ref Range   Yeast Wet Prep HPF POC NONE SEEN NONE SEEN   Trich, Wet Prep NONE SEEN NONE SEEN  Clue Cells Wet Prep HPF POC NONE SEEN NONE SEEN   WBC, Wet Prep HPF POC <10 <10   Sperm PRESENT    US OB Transvaginal  Result Date: 11/12/2022 CLINICAL  DATA:  Abdominal pain and cramping. Estimated gestation by last menstrual period equals 5 weeks 3 days EXAM: OBSTETRIC <14 WK [place] AND TRANSVAGINAL OB US TECHNIQUE: Both transabdominal and transvaginal ultrasound examinations were performed for complete evaluation of the gestation as well as the maternal uterus, adnexal regions, and pelvic cul-de-sac. Transvaginal technique was performed to assess early pregnancy. COMPARISON:  None Available. FINDINGS: Intrauterine gestational sac: Not identified Yolk sac:  Not identified Embryo:  Not identified Subchorionic hemorrhage:  None visualized. Maternal uterus/adnexae: Dominant follicle versus corpus luteum of the RIGHT ovary measures 21 mm. The RIGHT ovary is normal in size at 3.1 x 3.1 x 3.2 cm. LEFT ovary is normal at 2.4 x 1.7 x 1.9 cm. Small free fluid. Bicornuate uterus. IMPRESSION: 1. No intrauterine gestation identified. 2. Bicornuate uterus. 3. Dominant follicle in the RIGHT ovary versus corpus luteal cyst. 4. Normal LEFT ovary. 5. Small amount of free fluid. 6. No intrauterine gestational sac, yolk sac, or fetal pole identified. Differential considerations include intrauterine pregnancy too early to be sonographically visualized, missed abortion, or ectopic pregnancy. Followup ultrasound is recommended in 10-14 days for further evaluation. Electronically Signed   By: Genevive Bi M.D.   On: 11/12/2022 16:29     Assessment and Plan  --21 y.o. G2P0010 with pregnancy of unknown location --Bicornuate uterus --Quant hCG 98 --Discharge home in stable condition with ectopic precautions  F/U: --Appt made for repeat stat Quant hCG at [facility] at 0830 on Wed 11/15/2022 --Discussed with patient that if holiday office schedule means she cannot be seen for this appointment, she can return to MAU for repeat blood work  Calvert Cantor, MSA, MSN, CNM 11/12/2022, 6:28 PM

## 2022-11-13 LAB — GC/CHLAMYDIA PROBE AMP (~~LOC~~) NOT AT ARMC
Chlamydia: NEGATIVE
Comment: NEGATIVE
Comment: NORMAL
Neisseria Gonorrhea: NEGATIVE

## 2022-11-14 ENCOUNTER — Telehealth: Payer: Self-pay | Admitting: Family Medicine

## 2022-11-14 ENCOUNTER — Inpatient Hospital Stay (HOSPITAL_COMMUNITY)
Admission: AD | Admit: 2022-11-14 | Discharge: 2022-11-14 | Disposition: A | Discharge status: Home / Self Care | Payer: Medicaid Other | Attending: Obstetrics and Gynecology | Admitting: Obstetrics and Gynecology

## 2022-11-14 ENCOUNTER — Other Ambulatory Visit: Payer: Self-pay

## 2022-11-14 DIAGNOSIS — O3680X0 Pregnancy with inconclusive fetal viability, not applicable or unspecified: Secondary | ICD-10-CM | POA: Insufficient documentation

## 2022-11-14 DIAGNOSIS — Q513 Bicornate uterus: Secondary | ICD-10-CM

## 2022-11-14 LAB — HCG, QUANTITATIVE, PREGNANCY: hCG, Beta Chain, Quant, S: 206 m[IU]/mL — ABNORMAL HIGH (ref <5)

## 2022-11-14 MED ORDER — PRENATAL PLUS 27-1 MG PO TABS: 1.0000 | ORAL_TABLET | Freq: Every day | ORAL | 3 refills | Status: DC

## 2022-11-14 NOTE — Progress Notes (Signed)
Gerrit Heck CNM in Triage to discuss test results and d/c plan with pt. WRitten and verbal d/c instructions given and understanding voiced.

## 2022-11-14 NOTE — MAU Provider Note (Signed)
None     S Ms. Dennise Clarkston is a 21 y.o. G2P0010 patient who presents to MAU today for repeat hCG level.  Patient hCG on 11/19 was 98.  Patient reports some cramping that she describes as "like small period cramps"  and states it occurs "a few times per day."  She denies nausea, vomiting, and pain.   O BP 126/65 (BP Location: Right Arm)   Pulse 67   Temp 98.2 F (36.8 C) (Oral)   Resp 19   Ht 5\' 6"  (1.676 m)   Wt 88.5 kg   LMP 10/05/2022   SpO2 100%   BMI 31.51 kg/m  Physical Exam Vitals reviewed.  Constitutional:      Appearance: Normal appearance.  HENT:     Head: Normocephalic and atraumatic.  Eyes:     Conjunctiva/sclera: Conjunctivae normal.  Cardiovascular:     Rate and Rhythm: Normal rate.  Pulmonary:     Effort: Pulmonary effort is normal. No respiratory distress.  Musculoskeletal:     Cervical back: Normal range of motion.  Neurological:     Mental Status: She is alert and oriented to person, place, and time.  Psychiatric:        Mood and Affect: Mood normal.        Behavior: Behavior normal.    A Medical screening exam complete Repeat Labs  P hCG ordered. Await results.  12/05/2022, CNM 11/14/2022 5:52 PM   Reassessment (7:13 PM)  -Appropriate rise in hCG level. -Informed that repeat 11/16/2022 will be scheduled for 2-3 weeks and she will be contacted accordingly. -Return precautions reviewed. -Discharged to home in stable condition.  [place] MSN, CNM Advanced Practice Provider, [facility]

## 2022-11-14 NOTE — MAU Note (Signed)
Ellen Huffman is a 21 y.o. at [redacted]w[redacted]d here in MAU reporting: she's here for HCG level.  Denies VB, endorses mild cramping. LMP: NA Onset of complaint: 2 days Pain score: 2 Vitals:   11/14/22 1741  BP: 126/65  Pulse: 67  Resp: 19  Temp: 98.2 F (36.8 C)  SpO2: 100%     FHT:NA Lab orders placed from triage:   HCG Level

## 2022-11-14 NOTE — Telephone Encounter (Signed)
Patient called in to cancel lab appointment for 11/22 and stated she is just going to go to MAU instead so she is able to go before work.

## 2022-11-15 ENCOUNTER — Other Ambulatory Visit: Payer: Self-pay

## 2022-11-27 ENCOUNTER — Encounter (HOSPITAL_COMMUNITY): Payer: Self-pay | Admitting: Obstetrics & Gynecology

## 2022-11-27 ENCOUNTER — Inpatient Hospital Stay (HOSPITAL_COMMUNITY): Payer: Medicaid Other

## 2022-11-27 ENCOUNTER — Inpatient Hospital Stay (HOSPITAL_COMMUNITY)
Admission: AD | Admit: 2022-11-27 | Discharge: 2022-11-27 | Disposition: A | Discharge status: Home / Self Care | Payer: Medicaid Other | Attending: Obstetrics & Gynecology | Admitting: Obstetrics & Gynecology

## 2022-11-27 DIAGNOSIS — O26891 Other specified pregnancy related conditions, first trimester: Secondary | ICD-10-CM | POA: Insufficient documentation

## 2022-11-27 DIAGNOSIS — O26899 Other specified pregnancy related conditions, unspecified trimester: Secondary | ICD-10-CM

## 2022-11-27 DIAGNOSIS — R109 Unspecified abdominal pain: Secondary | ICD-10-CM | POA: Diagnosis present

## 2022-11-27 DIAGNOSIS — Z3491 Encounter for supervision of normal pregnancy, unspecified, first trimester: Secondary | ICD-10-CM

## 2022-11-27 DIAGNOSIS — Z3A01 Less than 8 weeks gestation of pregnancy: Secondary | ICD-10-CM | POA: Diagnosis not present

## 2022-11-27 LAB — CBC
HCT: 41 % (ref 36.0–46.0)
Hemoglobin: 13.5 g/dL (ref 12.0–15.0)
MCH: 30.8 pg (ref 26.0–34.0)
MCHC: 32.9 g/dL (ref 30.0–36.0)
MCV: 93.6 fL (ref 80.0–100.0)
Platelets: 268 10*3/uL (ref 150–400)
RBC: 4.38 MIL/uL (ref 3.87–5.11)
RDW: 12.3 % (ref 11.5–15.5)
WBC: 6.1 10*3/uL (ref 4.0–10.5)
nRBC: 0 % (ref 0.0–0.2)

## 2022-11-27 LAB — URINALYSIS, ROUTINE W REFLEX MICROSCOPIC
Bilirubin Urine: NEGATIVE
Glucose, UA: NEGATIVE mg/dL
Hgb urine dipstick: NEGATIVE
Ketones, ur: NEGATIVE mg/dL
Leukocytes,Ua: NEGATIVE
Nitrite: NEGATIVE
Protein, ur: NEGATIVE mg/dL
Specific Gravity, Urine: 1.005 (ref 1.005–1.030)
pH: 7 (ref 5.0–8.0)

## 2022-11-27 LAB — HCG, QUANTITATIVE, PREGNANCY: hCG, Beta Chain, Quant, S: 35087 m[IU]/mL — ABNORMAL HIGH (ref <5)

## 2022-11-27 NOTE — MAU Provider Note (Signed)
History     [id number]  Arrival date and time: 11/27/22 1213    Chief Complaint  Patient presents with   Abdominal Pain     HPI Ellen Huffman is a 21 y.o. at [redacted]w[redacted]d who presents for abdominal pain. Reports intense abdominal cramping this morning. Denies n/v/d, constipation, vaginal bleeding, dysuria, or vaginal discharge.    OB History     Gravida  2   Para      Term      Preterm      AB  1   Living         SAB  1   IAB      Ectopic      Multiple      Live Births              Past Medical History:  Diagnosis Date   Allergy    Anxiety    Bipolar disorder (HCC)    Depression    Mental disorder    Overdose 12/25/2013   Seizures (HCC) 2022   drug withdrawl    Past Surgical History:  Procedure Laterality Date   DILATION AND CURETTAGE OF UTERUS N/A 09/27/2021   Procedure: DILATATION AND CURETTAGE WITH FROZEN WITH ULTRA SOUND;  Surgeon: [place] Bing, MD;  Location: [facility] OR;  Service: Gynecology;  Laterality: N/A;    Family History  Problem Relation Age of Onset   Lupus Mother    Hyperlipidemia Father    Migraines Neg Hx    Parkinsonism Neg Hx    Seizures Neg Hx    ADD / ADHD Neg Hx    Bipolar disorder Neg Hx     Allergies  Allergen Reactions   Penicillins Rash    No current facility-administered medications on file prior to encounter.   Current Outpatient Medications on File Prior to Encounter  Medication Sig Dispense Refill   prenatal vitamin w/FE, FA (PRENATAL 1 + 1) 27-1 MG TABS tablet Take 1 tablet by mouth daily at 12 noon. 90 tablet 3   acetaminophen (TYLENOL) 500 MG tablet Take 1 tablet (500 mg total) by mouth every 6 (six) hours as needed. 30 tablet 0   albuterol (VENTOLIN HFA) 108 (90 Base) MCG/ACT inhaler Inhale 2 puffs into the lungs every 6 (six) hours as needed for wheezing or shortness of breath. 8 g 2   ARIPiprazole (ABILIFY) 5 MG tablet Take 1 tablet by mouth daily.     hydrOXYzine (ATARAX) 10 MG tablet Take 1 tablet by  mouth 3 (three) times daily as needed.     lamoTRIgine (LAMICTAL) 25 MG tablet Take 1 tablet by mouth daily.     traZODone (DESYREL) 100 MG tablet Take 1 tablet by mouth at bedtime.     traZODone (DESYREL) 50 MG tablet Take 1 tablet by mouth at bedtime.       ROS Pertinent positives and negative per HPI, all others reviewed and negative  Physical Exam   BP 122/62 (BP Location: Right Arm)   Pulse 71   Temp 98.6 F (37 C) (Oral)   Resp 18   Ht 5\' 6"  (1.676 m)   Wt 88 kg   LMP 10/05/2022   SpO2 100%   BMI 31.30 kg/m   Patient Vitals for the past 24 hrs:  BP Temp Temp src Pulse Resp SpO2 Height Weight  11/27/22 1245 122/62 98.6 F (37 C) Oral 71 18 100 % 5\' 6"  (1.676 m) 88 kg    Physical Exam Vitals and nursing  note reviewed.  Constitutional:      General: She is not in acute distress.    Appearance: She is well-developed.  HENT:     Head: Normocephalic and atraumatic.  Pulmonary:     Effort: Pulmonary effort is normal. No respiratory distress.  Neurological:     Mental Status: She is alert.  Psychiatric:        Mood and Affect: Mood normal.        Behavior: Behavior normal.       Labs Results for orders placed or performed during the hospital encounter of 11/27/22 (from the past 24 hour(s))  CBC     Status: None   Collection Time: 11/27/22  1:13 PM  Result Value Ref Range   WBC 6.1 4.0 - 10.5 K/uL   RBC 4.38 3.87 - 5.11 MIL/uL   Hemoglobin 13.5 12.0 - 15.0 g/dL   HCT 73.2 20.2 - 54.2 %   MCV 93.6 80.0 - 100.0 fL   MCH 30.8 26.0 - 34.0 pg   MCHC 32.9 30.0 - 36.0 g/dL   RDW 70.6 23.7 - 62.8 %   Platelets 268 150 - 400 K/uL   nRBC 0.0 0.0 - 0.2 %  Urinalysis, Routine w reflex microscopic Urine, Clean Catch     Status: None   Collection Time: 11/27/22  1:31 PM  Result Value Ref Range   Color, Urine YELLOW YELLOW   APPearance CLEAR CLEAR   Specific Gravity, Urine 1.005 1.005 - 1.030   pH 7.0 5.0 - 8.0   Glucose, UA NEGATIVE NEGATIVE mg/dL   Hgb urine  dipstick NEGATIVE NEGATIVE   Bilirubin Urine NEGATIVE NEGATIVE   Ketones, ur NEGATIVE NEGATIVE mg/dL   Protein, ur NEGATIVE NEGATIVE mg/dL   Nitrite NEGATIVE NEGATIVE   Leukocytes,Ua NEGATIVE NEGATIVE    Imaging US OB Transvaginal  Result Date: 11/27/2022 CLINICAL DATA:  Abdominal pain.  First trimester pregnancy EXAM: OBSTETRIC <14 WK TRANSVAGINAL OB US TECHNIQUE: transvaginal ultrasound examinations were performed for complete evaluation of the gestation as well as the maternal uterus, adnexal regions, and pelvic cul-de-sac. Transvaginal technique was performed to assess early pregnancy. COMPARISON:  Ultrasound 11/12/2022 FINDINGS: Intrauterine gestational sac: Single Yolk sac:  Present Embryo:  Present Cardiac Activity: Present Heart Rate: 100 bpm CRL:  1 2.4 mm   5 w   5 d                  [place] EDC: 07/25/2023 Subchorionic hemorrhage:  None visualized. Maternal uterus/adnexae: Normal. Dominant follicle RIGHT ovary. No free fluid. IMPRESSION: 1. Single intrauterine gestation with embryo and normal cardiac activity. 2. Estimated gestational age by crown rump length equals 5 weeks 5 days. Electronically Signed   By: Genevive Bi M.D.   On: 11/27/2022 13:58    MAU Course  Procedures  Lab Orders         Urinalysis, Routine w reflex microscopic Urine, Clean Catch         CBC         hCG, quantitative, pregnancy    No orders of the defined types were placed in this encounter.   Imaging Orders         US OB Transvaginal     MDM Ultrasound today shows live IUP measuring [redacted]w[redacted]d, EDD updated U/a negative Assessment and Plan   1. Abdominal cramping affecting pregnancy   2. Normal IUP (intrauterine pregnancy) on prenatal ultrasound, first trimester   3. [redacted] weeks gestation of pregnancy    -Reviewed return  precautions -Start prenatal care  Judeth Horn, NP 11/27/22 2:34 PM

## 2022-11-27 NOTE — MAU Note (Signed)
Ellen Huffman is a 21 y.o. at [redacted]w[redacted]d here in MAU reporting: has appt for [place] next Monday.  Started cramping in lower abd at work, hurting really bad, thought she would just come in. Denies vag bleeding or d/c.  Denies diarrhea, constipation or problems with urination.  Onset of complaint: this morning Pain score: 7 Vitals:   11/27/22 1245  BP: 122/62  Pulse: 71  Resp: 18  Temp: 98.6 F (37 C)  SpO2: 100%      Lab orders placed from triage:  urine (unable to go, went when arrived, forgot to get urine)

## 2022-11-27 NOTE — Discharge Instructions (Signed)
  [facility] Providers          [facility]  [address], [place], [place] [address]  [phone]  [facility]  [address], [place], [place] [address]  [phone]  [facility]  [address], [place], [place] [address]  [phone]  [facility] [address], [place], [place] [address] [phone]  [facility] [place]  [address], [place], [place] [address]  [phone]  [facility]  [address] floor), [place], [place] [address]  [phone]  [facility]  [address], [place], [place] [address]  [phone]  [facility]  [address], [place], [place] [address]  [phone]  [facility]  [address], [place], [place] [address]  [phone]  [facility]  [address], [place], [place] [address]  [phone]  [facility]  [address], [place], [place] [address]  [phone]  [facility]  [address], [place], [place] [address]  [phone]  [facility]   [address], [place], [place] [address]  [phone]  [facility]  [address], [place], [place] [address]   [phone]  [facility] [address], [place],  [address] [phone]  [facility]  [address], [place],  [address]  [phone]         

## 2022-12-04 ENCOUNTER — Other Ambulatory Visit: Payer: Self-pay

## 2022-12-04 ENCOUNTER — Ambulatory Visit: Admission: RE | Admit: 2022-12-04 | Payer: Medicaid Other | Source: Ambulatory Visit

## 2022-12-04 ENCOUNTER — Inpatient Hospital Stay (HOSPITAL_COMMUNITY)
Admission: AD | Admit: 2022-12-04 | Discharge: 2022-12-04 | Disposition: A | Discharge status: Home / Self Care | Payer: Medicaid Other | Attending: Family Medicine | Admitting: Family Medicine

## 2022-12-04 DIAGNOSIS — Z3A01 Less than 8 weeks gestation of pregnancy: Secondary | ICD-10-CM | POA: Insufficient documentation

## 2022-12-04 DIAGNOSIS — Z79899 Other long term (current) drug therapy: Secondary | ICD-10-CM | POA: Insufficient documentation

## 2022-12-04 DIAGNOSIS — O26851 Spotting complicating pregnancy, first trimester: Secondary | ICD-10-CM

## 2022-12-04 DIAGNOSIS — O209 Hemorrhage in early pregnancy, unspecified: Secondary | ICD-10-CM | POA: Insufficient documentation

## 2022-12-04 DIAGNOSIS — Z331 Pregnant state, incidental: Secondary | ICD-10-CM

## 2022-12-04 NOTE — MAU Provider Note (Signed)
History     CSN: [id number]  Arrival date and time: 12/04/22 1159   Event Date/Time   First Provider Initiated Contact with Patient 12/04/22 1222      Chief Complaint  Patient presents with   Vaginal Bleeding   Abdominal Pain   Ellen Huffman is a 21 y.o. G2P0010 at [redacted]w[redacted]d who presents today with brown spotting. She has had some mild intermittent cramping as well. This started this morning and her mom brought her here immediatly after it started.   Vaginal Bleeding The patient's primary symptoms include pelvic pain and vaginal bleeding. This is a new problem. The current episode started today. The problem occurs intermittently. The problem has been unchanged. The pain is mild. The problem affects both sides. She is pregnant. The vaginal discharge was brown. The vaginal bleeding is spotting. She has not been passing clots. She has not been passing tissue. Nothing aggravates the symptoms. She has tried nothing for the symptoms.    OB History     Gravida  2   Para      Term      Preterm      AB  1   Living         SAB  1   IAB      Ectopic      Multiple      Live Births              Past Medical History:  Diagnosis Date   Allergy    Anxiety    Bipolar disorder (HCC)    Depression    Mental disorder    Overdose 12/25/2013   Seizures (HCC) 2022   drug withdrawl    Past Surgical History:  Procedure Laterality Date   DILATION AND CURETTAGE OF UTERUS N/A 09/27/2021   Procedure: DILATATION AND CURETTAGE WITH FROZEN WITH ULTRA SOUND;  Surgeon: [place] Bing, MD;  Location: [facility] OR;  Service: Gynecology;  Laterality: N/A;    Family History  Problem Relation Age of Onset   Lupus Mother    Hyperlipidemia Father    Migraines Neg Hx    Parkinsonism Neg Hx    Seizures Neg Hx    ADD / ADHD Neg Hx    Bipolar disorder Neg Hx     Social History   Tobacco Use   Smoking status: Former    Types: Cigarettes   Smokeless tobacco: Never   Tobacco comments:     Quit Jan 2023    quit vaping with preg  Vaping Use   Vaping Use: Former   Substances: Nicotine, Flavoring  Substance Use Topics   Alcohol use: Not Currently    Comment: drinks on the weekends   Drug use: Not Currently    Types: Marijuana    Comment: percocet, last use marijuana 04/2021    Allergies:  Allergies  Allergen Reactions   Penicillins Rash    Medications Prior to Admission  Medication Sig Dispense Refill Last Dose   acetaminophen (TYLENOL) 500 MG tablet Take 1 tablet (500 mg total) by mouth every 6 (six) hours as needed. 30 tablet 0    albuterol (VENTOLIN HFA) 108 (90 Base) MCG/ACT inhaler Inhale 2 puffs into the lungs every 6 (six) hours as needed for wheezing or shortness of breath. 8 g 2    ARIPiprazole (ABILIFY) 5 MG tablet Take 1 tablet by mouth daily.      hydrOXYzine (ATARAX) 10 MG tablet Take 1 tablet by mouth 3 (three) times daily as  needed.      lamoTRIgine (LAMICTAL) 25 MG tablet Take 1 tablet by mouth daily.      prenatal vitamin w/FE, FA (PRENATAL 1 + 1) 27-1 MG TABS tablet Take 1 tablet by mouth daily at 12 noon. 90 tablet 3    traZODone (DESYREL) 100 MG tablet Take 1 tablet by mouth at bedtime.      traZODone (DESYREL) 50 MG tablet Take 1 tablet by mouth at bedtime.       Review of Systems  Genitourinary:  Positive for pelvic pain and vaginal bleeding.  All other systems reviewed and are negative.  Physical Exam   Blood pressure 117/65, pulse 74, temperature 97.9 F (36.6 C), temperature source Oral, resp. rate 19, height 5\' 6"  (1.676 m), weight 88.8 kg, last menstrual period 10/05/2022, SpO2 100 %.  Physical Exam Constitutional:      Appearance: She is well-developed.  HENT:     Head: Normocephalic.  Eyes:     Pupils: Pupils are equal, round, and reactive to light.  Cardiovascular:     Rate and Rhythm: Normal rate and regular rhythm.     Heart sounds: Normal heart sounds.  Pulmonary:     Effort: Pulmonary effort is normal. No respiratory  distress.     Breath sounds: Normal breath sounds.  Abdominal:     Palpations: Abdomen is soft.     Tenderness: There is no abdominal tenderness.  Genitourinary:    Comments:   Musculoskeletal:        General: Normal range of motion.     Cervical back: Normal range of motion and neck supple.  Skin:    General: Skin is warm and dry.  Neurological:     Mental Status: She is alert and oriented to person, place, and time.  Psychiatric:        Mood and Affect: Mood normal.        Behavior: Behavior normal.     Pt informed that the ultrasound is considered a limited OB ultrasound and is not intended to be a complete ultrasound exam.  Patient also informed that the ultrasound is not being completed with the intent of assessing for fetal or placental anomalies or any pelvic abnormalities.  Explained that the purpose of today's ultrasound is to assess for  viability.  Patient acknowledges the purpose of the exam and the limitations of the study.    +FCA visible on trans-abdominal scan, but unable to hold position long enough to hear with doppler   MAU Course  Procedures  MDM   Assessment and Plan   1. Spotting affecting pregnancy in first trimester   2. [redacted] weeks gestation of pregnancy   3. IUP (intrauterine pregnancy), incidental    DC home in stable condition  List of OB providers given so patient can schedule appt for prenatal care 1st Trimester precautions  Bleeding precautions RX: none  Return to MAU as needed Start prenatal care   Follow-up Information     Department, [facility]. Schedule an appointment as soon as possible for a visit.   Contact information: [address] [place] [phone]                [phone] DNP, CNM  12/04/22  1:32 PM

## 2022-12-04 NOTE — MAU Note (Signed)
Ellen Huffman is a 21 y.o. at [redacted]w[redacted]d here in MAU reporting: she's having abdominal cramping and brown vaginal discharge.  Denies recent intercourse. LMP: NA Onset of complaint: today Pain score: 6 Vitals:   12/04/22 1217  BP: 117/65  Pulse: 74  Resp: 19  Temp: 97.9 F (36.6 C)  SpO2: 100%     FHT:NA Lab orders placed from triage:   None

## 2022-12-04 NOTE — Discharge Instructions (Signed)
Prenatal Care Providers           [facility]  [address] [phone]  [facility]   [address]  [phone]  [facility] [place]       [address] [phone]            [facility] @      [address] [phone]          [facility] [place]   [address] [phone]  [facility]  [address] [phone]     [facility])  [address]   [phone]     [facility]  Phone: [phone]  [facility]  Phone: [phone]  [facility] Phone: [phone]  [facility] Phone: [phone]  [facility] Phone: [phone]  [facility] Phone: [phone]  [facility]  Phone: [phone]  

## 2022-12-04 NOTE — Progress Notes (Addendum)
Ellen Huffman, CNM into see pt and evaluate.  Bedside U/S done, pregnancy visualized with ultrasound.

## 2022-12-05 ENCOUNTER — Other Ambulatory Visit: Payer: Medicaid Other

## 2022-12-06 ENCOUNTER — Other Ambulatory Visit: Payer: Self-pay

## 2022-12-06 ENCOUNTER — Inpatient Hospital Stay (HOSPITAL_COMMUNITY)
Admission: AD | Admit: 2022-12-06 | Discharge: 2022-12-06 | Disposition: A | Discharge status: Home / Self Care | Payer: Medicaid Other | Attending: Obstetrics and Gynecology | Admitting: Obstetrics and Gynecology

## 2022-12-06 DIAGNOSIS — Z3A01 Less than 8 weeks gestation of pregnancy: Secondary | ICD-10-CM

## 2022-12-06 DIAGNOSIS — O036 Delayed or excessive hemorrhage following complete or unspecified spontaneous abortion: Secondary | ICD-10-CM | POA: Diagnosis not present

## 2022-12-06 DIAGNOSIS — O029 Abnormal product of conception, unspecified: Secondary | ICD-10-CM | POA: Diagnosis present

## 2022-12-06 DIAGNOSIS — O26851 Spotting complicating pregnancy, first trimester: Secondary | ICD-10-CM

## 2022-12-06 DIAGNOSIS — Z3491 Encounter for supervision of normal pregnancy, unspecified, first trimester: Secondary | ICD-10-CM

## 2022-12-06 DIAGNOSIS — Z6790 Unspecified blood type, Rh positive: Secondary | ICD-10-CM

## 2022-12-06 NOTE — MAU Note (Signed)
Ellen Huffman is a 21 y.o. at [redacted]w[redacted]d here in MAU reporting: today when she went to the bathroom she saw bleeding into the toilet. States bleeding has slowed down since then. Wearing a pad. Having some lower abdominal cramping but states this is not a new problem. No recent IC.   Onset of complaint: today  Pain score: 2/10  Vitals:   12/06/22 1543  BP: 127/69  Pulse: 78  Resp: 16  Temp: 98.8 F (37.1 C)  SpO2: 100%     FHT:NA  Lab orders placed from triage: none

## 2022-12-06 NOTE — Discharge Instructions (Signed)

## 2022-12-06 NOTE — MAU Provider Note (Signed)
Event Date/Time   First Provider Initiated Contact with Patient 12/06/22 1547      S [name] is a 21 y.o. G2P0010 patient who presents to MAU today with complaint of bleeding when she wiped after voiding. Bleeding then improved and she has not seen anything on her current pad. She also endorses mild cramping, which is a recurrent problem in her current pregnancy. Patient shares that she is especially nervous because she is the same GA she was when she miscarried Oct 2022.   O BP 127/69 (BP Location: Right Arm)   Pulse 78   Temp 98.8 F (37.1 C) (Oral)   Resp 16   Ht 5\' 6"  (1.676 m)   Wt 88.3 kg   LMP 10/05/2022   SpO2 100%   BMI 31.41 kg/m    Physical Exam Vitals and nursing note reviewed.  Constitutional:      Appearance: She is well-developed. She is not ill-appearing.  Cardiovascular:     Rate and Rhythm: Normal rate and regular rhythm.     Heart sounds: Normal heart sounds.  Pulmonary:     Effort: Pulmonary effort is normal.     Breath sounds: Normal breath sounds.  Abdominal:     General: Abdomen is flat.     Palpations: Abdomen is soft.     Tenderness: There is no abdominal tenderness.  Neurological:     Mental Status: She is alert.     A Medical screening exam complete SIUP confirmed with formal 12/05/2022 on 11/27/2022 Fetal pole with cardiac flicker visualized on BSUS today Reassurance provided  P Discharge from MAU in stable condition with return precautions.   14/03/2022, Calvert Cantor 12/07/2022 7:49 AM

## 2022-12-08 ENCOUNTER — Ambulatory Visit (INDEPENDENT_AMBULATORY_CARE_PROVIDER_SITE_OTHER): Payer: Medicaid Other

## 2022-12-08 DIAGNOSIS — Z3689 Encounter for other specified antenatal screening: Secondary | ICD-10-CM

## 2022-12-08 DIAGNOSIS — O0990 Supervision of high risk pregnancy, unspecified, unspecified trimester: Secondary | ICD-10-CM

## 2022-12-08 NOTE — Progress Notes (Signed)
New OB Intake  I connected withNAME@ on 12/08/22 at  9:00 AM EST by telephone Video Visit and verified that I am speaking with the correct person using two identifiers. Nurse is located at [facility] and pt is located at home.  I discussed the limitations, risks, security and privacy concerns of performing an evaluation and management service by telephone and the availability of in person appointments. I also discussed with the patient that there may be a patient responsible charge related to this service. The patient expressed understanding and agreed to proceed.  I explained I am completing New OB Intake today. We discussed EDD of 07/25/23 that is based on ultrasound. Pt is G2/P0. I reviewed her allergies, medications, Medical/Surgical/OB history, and appropriate screenings. I informed her of [facility] services. [facility] information placed in AVS. Based on history, this is a high risk pregnancy.  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   Follow-up visit after miscarriage 11/14/2021   Bipolar I disorder, most recent episode (or current) manic (HCC) 06/30/2021   Opioid abuse with opioid-induced mood disorder (HCC) 03/31/2021   Autonomic dysfunction 09/17/2017   Mild headache 09/17/2017   Anxiety state 09/17/2017   Palpitations 09/17/2017   Deliberate self-cutting 07/14/2014   MDD (major depressive disorder), recurrent severe, 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   Cannabis abuse 07/14/2014   Suicide attempt (HCC) 07/14/2014   ADHD (attention deficit hyperactivity disorder), 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 Patient given information for [facility] Healthy Baby website for more information about [facility]. Patient is not interested in water birth. Offered upcoming OB visit with CNM to discuss further.  MyChart/Babyscripts MyChart access  verified. I explained pt will have some visits in office and some virtually. Babyscripts instructions given and order placed. Patient verifies receipt of registration text/e-mail. Account successfully created and app downloaded.  Blood Pressure Cuff/Weight Scale Blood pressure cuff ordered for patient to pick-up from [employer]. Explained after first prenatal appt pt will check weekly and document in Babyscripts. Patient does not have weight scale; order sent to [facility], patient may track weight weekly in Babyscripts.  Anatomy US Explained first scheduled [place] will be around 19 weeks.   Labs Discussed Avelina Laine genetic screening with patient. Would like both Panorama and Horizon drawn at new OB visit. Routine prenatal labs needed.  COVID Vaccine Patient has not had COVID vaccine.     Is patient a Mom[facility]Baby Combined Care candidate?  Declined   If accepted, Mom[facility]Baby staff notified  Social Determinants of Health Food Insecurity: Patient denies food insecurity. WIC Referral: Patient is not interested in referral to [facility].  Transportation: Patient denies transportation needs. Childcare: Discussed no children allowed at ultrasound appointments. Offered childcare services; patient declines childcare services at this time.  First visit review I reviewed new OB appt with patient. Explained pt will be seen by CNM at first visit; encounter routed to appropriate provider. Explained that patient will be seen by pregnancy navigator following visit with provider.   Dalphine Handing, CMA 12/08/2022  9:23 AM

## 2022-12-12 ENCOUNTER — Other Ambulatory Visit: Payer: Self-pay | Admitting: Emergency Medicine

## 2022-12-12 DIAGNOSIS — Z3201 Encounter for pregnancy test, result positive: Secondary | ICD-10-CM

## 2022-12-12 MED ORDER — GOJJI WEIGHT SCALE MISC: 1.0000 | 0 refills | Status: DC

## 2022-12-12 MED ORDER — BLOOD PRESSURE KIT DEVI: 1.0000 | 0 refills | Status: DC

## 2022-12-21 ENCOUNTER — Inpatient Hospital Stay (HOSPITAL_COMMUNITY)
Admission: AD | Admit: 2022-12-21 | Discharge: 2022-12-21 | Disposition: A | Discharge status: Home / Self Care | Payer: Medicaid Other | Attending: Obstetrics and Gynecology | Admitting: Obstetrics and Gynecology

## 2022-12-21 ENCOUNTER — Inpatient Hospital Stay (HOSPITAL_COMMUNITY): Payer: Medicaid Other

## 2022-12-21 ENCOUNTER — Other Ambulatory Visit: Payer: Self-pay

## 2022-12-21 DIAGNOSIS — Z87891 Personal history of nicotine dependence: Secondary | ICD-10-CM | POA: Diagnosis not present

## 2022-12-21 DIAGNOSIS — O021 Missed abortion: Secondary | ICD-10-CM

## 2022-12-21 DIAGNOSIS — O26891 Other specified pregnancy related conditions, first trimester: Secondary | ICD-10-CM | POA: Diagnosis present

## 2022-12-21 DIAGNOSIS — Z3A01 Less than 8 weeks gestation of pregnancy: Secondary | ICD-10-CM

## 2022-12-21 NOTE — MAU Provider Note (Signed)
History     CSN: [id number]  Arrival date and time: 12/21/22 1521   Event Date/Time   First Provider Initiated Contact with Patient 12/21/22 1557      Chief Complaint  Patient presents with   Abdominal Pain   Vaginal Bleeding   HPI  Ellen Huffman is a 21 y.o. G2P0010 at [redacted]w[redacted]d who presents for evaluation of intermittent cramping and spotting. Patient reports she went to [facility] today to see the baby and they told her they couldn't see anything. Since then, she has been cramping. Patient rates the pain as a 3/10 and has not tried anything for the pain.  She denies any vaginal bleeding, discharge, and leaking of fluid. Denies any constipation, diarrhea or any urinary complaints.   OB History     Gravida  2   Para      Term      Preterm      AB  1   Living         SAB  1   IAB      Ectopic      Multiple      Live Births              Past Medical History:  Diagnosis Date   Allergy    Anxiety    Bipolar disorder (HCC)    Depression    Mental disorder    Overdose 12/25/2013   Seizures (HCC) 2022   drug withdrawl    Past Surgical History:  Procedure Laterality Date   DILATION AND CURETTAGE OF UTERUS N/A 09/27/2021   Procedure: DILATATION AND CURETTAGE WITH FROZEN WITH ULTRA SOUND;  Surgeon:  Bing, MD;  Location: [facility] OR;  Service: Gynecology;  Laterality: N/A;    Family History  Problem Relation Age of Onset   Lupus Mother    Hyperlipidemia Father    Migraines Neg Hx    Parkinsonism Neg Hx    Seizures Neg Hx    ADD / ADHD Neg Hx    Bipolar disorder Neg Hx     Social History   Tobacco Use   Smoking status: Former    Types: Cigarettes    Passive exposure: Never   Smokeless tobacco: Never   Tobacco comments:    Quit Jan 2023    quit vaping with preg  Vaping Use   Vaping Use: Former   Substances: Nicotine, Flavoring  Substance Use Topics   Alcohol use: Not Currently    Comment: drinks on the weekends   Drug use: Not  Currently    Types: Marijuana    Comment: percocet, last use marijuana 04/2021    Allergies:  Allergies  Allergen Reactions   Penicillins Rash    No medications prior to admission.    Review of Systems  Constitutional: Negative.  Negative for fatigue and fever.  HENT: Negative.    Respiratory: Negative.  Negative for shortness of breath.   Cardiovascular: Negative.  Negative for chest pain.  Gastrointestinal:  Positive for abdominal pain. Negative for constipation, diarrhea, nausea and vomiting.  Genitourinary: Negative.  Negative for dysuria, vaginal bleeding and vaginal discharge.  Neurological: Negative.  Negative for dizziness and headaches.   Physical Exam   Blood pressure 135/70, pulse 71, temperature 98.4 F (36.9 C), temperature source Oral, resp. rate 16, height 5\' 6"  (1.676 m), weight 87.2 kg, last menstrual period 10/08/2022, SpO2 99 %.  Patient Vitals for the past 24 hrs:  BP Temp Temp src Pulse Resp SpO2 Height  Weight  12/21/22 1818 135/70 -- -- -- -- -- -- --  12/21/22 1543 129/65 98.4 F (36.9 C) Oral 71 16 99 % -- --  12/21/22 1539 -- -- -- -- -- -- 5\' 6"  (1.676 m) 87.2 kg    Physical Exam Vitals and nursing note reviewed.  Constitutional:      General: She is not in acute distress.    Appearance: She is well-developed.  HENT:     Head: Normocephalic.  Eyes:     Pupils: Pupils are equal, round, and reactive to light.  Cardiovascular:     Rate and Rhythm: Normal rate and regular rhythm.     Heart sounds: Normal heart sounds.  Pulmonary:     Effort: Pulmonary effort is normal. No respiratory distress.     Breath sounds: Normal breath sounds.  Abdominal:     General: Bowel sounds are normal. There is no distension.     Palpations: Abdomen is soft.     Tenderness: There is no abdominal tenderness.  Skin:    General: Skin is warm and dry.  Neurological:     Mental Status: She is alert and oriented to person, place, and time.  Psychiatric:         Mood and Affect: Mood normal.        Behavior: Behavior normal.        Thought Content: Thought content normal.        Judgment: Judgment normal.     MAU Course  Procedures  OB Transvaginal  Result Date: 12/21/2022 CLINICAL DATA:  Pregnant patient with pain. EXAM: TRANSVAGINAL OB ULTRASOUND TECHNIQUE: Transvaginal ultrasound was performed for complete evaluation of the gestation as well as the maternal uterus, adnexal regions, and pelvic cul-de-sac. COMPARISON:  None Available. FINDINGS: Intrauterine gestational sac: Single Yolk sac:  No Embryo:  Visualized Cardiac Activity: None CRL: 7.5 mm mm   6 w 4 d                  12/23/2022 EDC: August 12, 2023 Subchorionic hemorrhage:  A small subchorionic hemorrhage is noted. Maternal uterus/adnexae: 2 simple cysts are identified in the right ovary. No follow-up imaging recommended for the cyst. The left ovary is not visualized. IMPRESSION: 1. An IUP is identified. No cardiac activity is identified on today's study which is a change compared to the November 27, 2022 study. Findings meet definitive criteria for failed pregnancy. This follows SRU consensus guidelines: Diagnostic Criteria for Nonviable Pregnancy Early in the First Trimester. November 29, 2022 J Med [phone]. Electronically Signed   By: [id number] III M.D.   On: 12/21/2022 17:40     MDM  12/23/2022 OB Transvaginal  CNM independently reviewed the imaging ordered. Imaging show IUP measuring 6 weeks with no FHR  CNM informed patient of results of ultrasound showing missed AB. Condolences provided and space given for patient to process emotions. CNM discussed options for management including expectant management, cytotec and surgical management. Risks and benefits of each reviewed at length. Patient desires surgical management and verbalized understanding of risks and benefits of this method.   Message sent to scheduler.   Assessment and Plan   1. Missed abortion   2. [redacted] weeks gestation of pregnancy     -Discharge home in stable condition -Vaginal bleeding and pain precautions discussed -Patient advised to follow-up for surgical management next week. -Patient may return to MAU as needed or if her condition were to change or worsen  [place], CNM  12/21/2022, 3:57 PM

## 2022-12-21 NOTE — MAU Note (Signed)
Ellen Huffman is a 21 y.o. at [redacted]w[redacted]d here in MAU reporting: states she went to [facility] today for informal u/s and states they were not able to see anything and that is making her nervous due to hx of miscarriage. Ongoing cramping and spotting.  Onset of complaint: ongoing  Pain score: 3/10  Vitals:   12/21/22 1543  BP: 129/65  Pulse: 71  Resp: 16  Temp: 98.4 F (36.9 C)  SpO2: 99%     FHT:NA  Lab orders placed from triage: none

## 2022-12-22 ENCOUNTER — Telehealth: Payer: Self-pay | Admitting: Obstetrics and Gynecology

## 2022-12-22 NOTE — Telephone Encounter (Signed)
Telephone call to patient to notify her of surgery date/time/ instructions  Posted for 12/27/22 at [facility] Entrance A

## 2022-12-26 ENCOUNTER — Other Ambulatory Visit: Payer: Self-pay

## 2022-12-26 ENCOUNTER — Encounter (HOSPITAL_COMMUNITY): Payer: Self-pay | Admitting: Obstetrics and Gynecology

## 2022-12-26 ENCOUNTER — Other Ambulatory Visit: Payer: Self-pay | Admitting: Obstetrics and Gynecology

## 2022-12-26 DIAGNOSIS — O021 Missed abortion: Secondary | ICD-10-CM

## 2022-12-26 MED ORDER — DOXYCYCLINE HYCLATE 100 MG IV SOLR
200.0000 mg | Freq: Once | INTRAVENOUS | Status: AC
  Administered 2022-12-27: 200 mg via INTRAVENOUS
  Filled 2022-12-26 (×2): qty 200

## 2022-12-26 NOTE — H&P (View-Only) (Signed)
Obstetrics & Gynecology Surgical H&P   Primary [facility]YN: [facility]  Reason for Admission: scheduled d&c for missed miscarriage  History of Present Illness: Ellen Huffman is a 22 y.o. G2P0020 (Patient's last menstrual period was 10/08/2022.), with the above CC. PMHx is significant for h/o miscarriage.  Patient presented to MAU on 12/28 for bleeding and pain and diagnosed with 6wk 4 day miscarriage, CRL 7.86m and no cardiac motion. Options d/w and patient elected for surgical management. Hgb 13.5 and A POS  ROS: A 12-point review of systems was performed and negative, except as stated in the above HPI.  OBGYN History: As per HPI. OB History  Gravida Para Term Preterm AB Living  2       1    SAB IAB Ectopic Multiple Live Births  1            # Outcome Date GA Lbr Len/2nd Weight Sex Delivery Anes PTL Lv  2 Current           1 SAB 09/27/21 751w2d           Past Medical History: Past Medical History:  Diagnosis Date   ADHD (attention deficit hyperactivity disorder)    Allergy    Anxiety    Bipolar disorder ([facility]   Depression    Mental disorder    Overdose 12/25/2013   Seizures ([place]2022   drug withdrawl    Past Surgical History: Past Surgical History:  Procedure Laterality Date   DILATION AND CURETTAGE OF UTERUS N/A 09/27/2021   Procedure: DILATATION AND CURETTAGE WITH FROZEN WITH ULTRA SOUND;  Surgeon: PiAletha HalimMD;  [facility] Service: [facility];  Laterality: N/A;    Family History:  Family History  Problem Relation Age of Onset   Lupus Mother    Hyperlipidemia Father    Migraines Neg Hx    Parkinsonism Neg Hx    Seizures Neg Hx    ADD / ADHD Neg Hx    Bipolar disorder Neg Hx      Social History:  Social History   Socioeconomic History   Marital status: Single    Spouse name: Not on file   Number of children: Not on file   Years of education: Not on file   Highest education level: Not on file   Occupational History   Not on file  Tobacco Use   Smoking status: Former    Types: Cigarettes    Passive exposure: Never   Smokeless tobacco: Never   Tobacco comments:    Quit Jan 2023    quit vaping with preg  Vaping Use   Vaping Use: Some days   Substances: Nicotine, Flavoring  Substance and Sexual Activity   Alcohol use: Not Currently    Comment: drinks on the weekends   Drug use: Not Currently    Types: Marijuana    Comment: percocet, last use marijuana 04/2021   Sexual activity: Yes    Birth control/protection: None  Other Topics Concern   Not on file  Social History Narrative   Grade:11   School Name:[employer]   How does patient do in school: average   Patient lives with: Dad- his girlfriend and her daughter- mom with her at visit   Does patient have and IEP/504 Plan in school? No      What are the patient's hobbies or interest? Sleep- no hobbies   Social Determinants of Health   Financial Resource Strain:  Not on file  Food Insecurity: Not on file  Transportation Needs: Not on file  Physical Activity: Not on file  Stress: Not on file  Social Connections: Not on file  Intimate Partner Violence: Not on file     Allergy: Allergies  Allergen Reactions   Penicillins Rash    Current Outpatient Medications: Current Outpatient Medications  Medication Sig Dispense Refill   Blood Pressure Monitoring (BLOOD PRESSURE KIT) DEVI 1 Device by Does not apply route once a week. 1 each 0   Misc. Devices (GOJJI WEIGHT SCALE) MISC 1 Device by Does not apply route once a week. 1 each 0   prenatal vitamin w/FE, FA (PRENATAL 1 + 1) 27-1 MG TABS tablet Take 1 tablet by mouth daily at 12 noon. (Patient not taking: Reported on 12/22/2022) 90 tablet 3   No current facility-administered medications for this visit.   Facility-Administered Medications Ordered in Other Visits  Medication Dose Route Frequency Provider Last Rate Last Admin   [START ON 12/27/2022] doxycycline  (VIBRAMYCIN) 200 mg in dextrose 5 % 250 mL IVPB  200 mg Intravenous Once Aletha Halim, MD         Physical Exam: From 12/28    History    CSN: [id number]   Arrival date and time: 12/21/22 1521    Event Date/Time   First Provider Initiated Contact with Patient 12/21/22 1557          Chief Complaint  Patient presents with   Abdominal Pain   Vaginal Bleeding    HPI   Ellen Huffman is a 23 y.o. G2P0010 at 74w1dwho presents for evaluation of intermittent cramping and spotting. [facility] told her they couldn't see anything. Since then, she has been cramping. Patient rates the pain as a 3/10 and has not tried anything for the pain.  She denies any vaginal bleeding, discharge, and leaking of fluid. Denies any constipation, diarrhea or any urinary complaints.    OB History       Gravida  2   Para      Term      Preterm      AB  1   Living           SAB  1   IAB      Ectopic      Multiple      Live Births                      Past Medical History:  Diagnosis Date   Allergy     Anxiety     Bipolar disorder ([facility]     Depression     Mental disorder     Overdose 12/25/2013   Seizures ([facility] 2022    drug withdrawl           Past Surgical History:  Procedure Laterality Date   DILATION AND CURETTAGE OF UTERUS N/A 09/27/2021    Procedure: DILATATION AND CURETTAGE WITH FROZEN WITH ULTRA SOUND;  Surgeon: PAletha Halim MD;  [place] [facility]  Service: Gynecology;  Laterality: N/A;           Family History  Problem Relation Age of Onset   Lupus Mother     Hyperlipidemia Father     Migraines Neg Hx     Parkinsonism Neg Hx     Seizures Neg Hx     ADD / ADHD Neg Hx  Bipolar disorder Neg Hx        Social History         Tobacco Use   Smoking status: Former      Types: Cigarettes      Passive exposure: Never   Smokeless tobacco: Never   Tobacco comments:      Quit Jan 2023       quit vaping with preg  Vaping Use   Vaping Use: Former   Substances: Nicotine, Flavoring  Substance Use Topics   Alcohol use: Not Currently      Comment: drinks on the weekends   Drug use: Not Currently      Types: Marijuana      Comment: percocet, last use marijuana 04/2021      Allergies:      Allergies  Allergen Reactions   Penicillins Rash      No medications prior to admission.      Review of Systems  Constitutional: Negative.  Negative for fatigue and fever.  HENT: Negative.    Respiratory: Negative.  Negative for shortness of breath.   Cardiovascular: Negative.  Negative for chest pain.  Gastrointestinal:  Positive for abdominal pain. Negative for constipation, diarrhea, nausea and vomiting.  Genitourinary: Negative.  Negative for dysuria, vaginal bleeding and vaginal discharge.  Neurological: Negative.  Negative for dizziness and headaches.    Physical Exam    Blood pressure 135/70, pulse 71, temperature 98.4 F (36.9 C), temperature source Oral, resp. rate 16, height _0  (1.676 m), weight 87.2 kg, last menstrual period 10/08/2022, SpO2 99 %.   Patient Vitals for the past 24 hrs:   BP Temp Temp src Pulse Resp SpO2 Height Weight  12/21/22 1818 135/70 -- -- -- -- -- -- --  12/21/22 1543 129/65 98.4 F (36.9 C) Oral 71 16 99 % -- --  12/21/22 1539 -- -- -- -- -- -- _1  (1.676 m) 87.2 kg      Physical Exam Vitals and nursing note reviewed.  Constitutional:      General: She is not in acute distress.    Appearance: She is well-developed.  HENT:     Head: Normocephalic.  Eyes:     Pupils: Pupils are equal, round, and reactive to light.  Cardiovascular:     Rate and Rhythm: Normal rate and regular rhythm.     Heart sounds: Normal heart sounds.  Pulmonary:     Effort: Pulmonary effort is normal. No respiratory distress.     Breath sounds: Normal breath sounds.  Abdominal:     General: Bowel sounds are normal. There is no distension.     Palpations:  Abdomen is soft.     Tenderness: There is no abdominal tenderness.  Skin:    General: Skin is warm and dry.  Neurological:     Mental Status: She is alert and oriented to person, place, and time.  Psychiatric:        Mood and Affect: Mood normal.        Behavior: Behavior normal.        Thought Content: Thought content normal.        Judgment: Judgment normal.        Laboratory:    Latest Ref Rng & Units 11/27/2022    1:13 PM 11/12/2022    3:35 PM 03/28/2022    7:19 PM  CBC  WBC 4.0 - 10.5 K/uL 6.1  7.2  7.5   Hemoglobin 12.0 - 15.0 g/dL 13.5  15.4  13.9   Hematocrit 36.0 - 46.0 % 41.0  44.2  41.1   Platelets 150 - 400 K/uL 268  289  279   A POS  Imaging:  Narrative & Impression  CLINICAL DATA:  Pregnant patient with pain.   EXAM: TRANSVAGINAL OB ULTRASOUND   TECHNIQUE: Transvaginal ultrasound was performed for complete evaluation of the gestation as well as the maternal uterus, adnexal regions, and pelvic cul-de-sac.   COMPARISON:  None Available.   FINDINGS: Intrauterine gestational sac: Single   Yolk sac:  No   Embryo:  Visualized   Cardiac Activity: None   CRL: 7.5 mm mm   6 w 4 d                  [place] EDC: August 12, 2023   Subchorionic hemorrhage:  A small subchorionic hemorrhage is noted.   Maternal uterus/adnexae: 2 simple cysts are identified in the right ovary. No follow-up imaging recommended for the cyst. The left ovary is not visualized.   IMPRESSION: 1. An IUP is identified. No cardiac activity is identified on today's study which is a change compared to the November 27, 2022 study. Findings meet definitive criteria for failed pregnancy. This follows SRU consensus guidelines: Diagnostic Criteria for Nonviable Pregnancy Early in the First Trimester. Ellen Huffman J Med [phone].     Electronically Signed   By: Dorise Bullion III M.D.   On: 12/21/2022 17:40    Assessment: Ms. Sedivy is a 22 y.o. G2P0010 (Patient's last menstrual period  was 10/08/2022.) here for scheduled d&c for missed miscarriage  Plan: Plan to proceed with suction d&c and send [facility].    Ellen Romans MD Attending Center for [facility])

## 2022-12-26 NOTE — H&P (Signed)
Obstetrics & Gynecology Surgical H&P   Primary OBGYN: [facility]  Reason for Admission: scheduled d&c for missed miscarriage  History of Present Illness: Ms. Sharpless is a 22 y.o. G2P0020 (Patient's last menstrual period was 10/08/2022.), with the above CC. PMHx is significant for h/o miscarriage.  Patient presented to [facility] on 12/28 for bleeding and pain and diagnosed with 6wk 4 day miscarriage, CRL 7.51m and no cardiac motion. Options d/w and patient elected for surgical management. Hgb 13.5 and A POS  ROS: A 12-point review of systems was performed and negative, except as stated in the above HPI.  OBGYN History: As per HPI. 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   [place]   drug withdrawl    Past Surgical History: Past Surgical History:  Procedure Laterality Date   DILATION AND CURETTAGE OF UTERUS N/A 09/27/2021   Procedure: DILATATION AND CURETTAGE WITH FROZEN WITH ULTRA SOUND;  Surgeon: PiAletha HalimMD;  [place] Service: [place];  Laterality: N/A;    Family History:  Family History  Problem Relation Age of Onset   Lupus Mother    Hyperlipidemia Father    Migraines Neg Hx    Parkinsonism Neg Hx    Seizures Neg Hx    ADD / ADHD Neg Hx    Bipolar disorder Neg Hx      Social History:  Social History   Socioeconomic History   Marital status: Single    Spouse name: Not on file   Number of children: Not on file   Years of education: Not on file   Highest education level: Not on file   Occupational History   Not on file  Tobacco Use   Smoking status: Former    Types: Cigarettes    Passive exposure: Never   Smokeless tobacco: Never   Tobacco comments:    Quit Jan 2023    quit vaping with preg  Vaping Use   Vaping Use: Some days   Substances: Nicotine, Flavoring  Substance and Sexual Activity   Alcohol use: Not Currently    Comment: drinks on the weekends   Drug use: Not Currently    Types: Marijuana    Comment: percocet, last use marijuana 04/2021   Sexual activity: Yes    Birth control/protection: None  Other Topics Concern   Not on file  Social History Narrative   Grade:11   School Name:[employer]   How does patient do in school: average   Patient lives with: Dad- his girlfriend and her daughter- mom with her at visit   Does patient have and IEP/504 Plan in school? No      What are the patient's hobbies or interest? Sleep- no hobbies   Social Determinants of Health   Financial Resource Strain:  Not on file  Food Insecurity: Not on file  Transportation Needs: Not on file  Physical Activity: Not on file  Stress: Not on file  Social Connections: Not on file  Intimate Partner Violence: Not on file     Allergy: Allergies  Allergen Reactions   Penicillins Rash    Current Outpatient Medications: Current Outpatient Medications  Medication Sig Dispense Refill   Blood Pressure Monitoring (BLOOD PRESSURE KIT) DEVI 1 Device by Does not apply route once a week. 1 each 0   Misc. Devices (GOJJI WEIGHT SCALE) MISC 1 Device by Does not apply route once a week. 1 each 0   prenatal vitamin w/FE, FA (PRENATAL 1 + 1) 27-1 MG TABS tablet Take 1 tablet by mouth daily at 12 noon. (Patient not taking: Reported on 12/22/2022) 90 tablet 3   No current facility-administered medications for this visit.   Facility-Administered Medications Ordered in Other Visits  Medication Dose Route Frequency Provider Last Rate Last Admin   [START ON 12/27/2022] doxycycline  (VIBRAMYCIN) 200 mg in dextrose 5 % 250 mL IVPB  200 mg Intravenous Once Aletha Halim, MD         Physical Exam: From 12/28    History    CSN: [id number]   Arrival date and time: 12/21/22 1521    Event Date/Time   First Provider Initiated Contact with Patient 12/21/22 1557          Chief Complaint  Patient presents with   Abdominal Pain   Vaginal Bleeding    HPI   Traniece Mcgirr is a 22 y.o. G2P0010 at 5w1dwho presents for evaluation of intermittent cramping and spotting. [facility] told her they couldn't see anything. Since then, she has been cramping. Patient rates the pain as a 3/10 and has not tried anything for the pain.  She denies any vaginal bleeding, discharge, and leaking of fluid. Denies any constipation, diarrhea or any urinary complaints.    OB History       Gravida  2   Para      Term      Preterm      AB  1   Living           SAB  1   IAB      Ectopic      Multiple      Live Births                      Past Medical History:  Diagnosis Date   Allergy     Anxiety     Bipolar disorder ([place]     Depression     Mental disorder     Overdose 12/25/2013   Seizures ([facility] 20[place]    drug withdrawl           Past Surgical History:  Procedure Laterality Date   DILATION AND CURETTAGE OF UTERUS N/A 09/27/2021    Procedure: DILATATION AND CURETTAGE WITH FROZEN WITH ULTRA SOUND;  Surgeon: PAletha Halim MD;  [place]  Service: Gynecology;  Laterality: N/A;           Family History  Problem Relation Age of Onset   Lupus Mother     Hyperlipidemia Father     Migraines Neg Hx     Parkinsonism Neg Hx     Seizures Neg Hx     ADD / ADHD Neg Hx  Bipolar disorder Neg Hx        Social History         Tobacco Use   Smoking status: Former      Types: Cigarettes      Passive exposure: Never   Smokeless tobacco: Never   Tobacco comments:      Quit Jan 2023       quit vaping with preg  Vaping Use   Vaping Use: Former   Substances: Nicotine, Flavoring  Substance Use Topics   Alcohol use: Not Currently      Comment: drinks on the weekends   Drug use: Not Currently      Types: Marijuana      Comment: percocet, last use marijuana 04/2021      Allergies:      Allergies  Allergen Reactions   Penicillins Rash      No medications prior to admission.      Review of Systems  Constitutional: Negative.  Negative for fatigue and fever.  HENT: Negative.    Respiratory: Negative.  Negative for shortness of breath.   Cardiovascular: Negative.  Negative for chest pain.  Gastrointestinal:  Positive for abdominal pain. Negative for constipation, diarrhea, nausea and vomiting.  Genitourinary: Negative.  Negative for dysuria, vaginal bleeding and vaginal discharge.  Neurological: Negative.  Negative for dizziness and headaches.    Physical Exam    Blood pressure 135/70, pulse 71, temperature 98.4 F (36.9 C), temperature source Oral, resp. rate 16, height _0  (1.676 m), weight 87.2 kg, last menstrual period 10/08/2022, SpO2 99 %.   Patient Vitals for the past 24 hrs:   BP Temp Temp src Pulse Resp SpO2 Height Weight  12/21/22 1818 135/70 -- -- -- -- -- -- --  12/21/22 1543 129/65 98.4 F (36.9 C) Oral 71 16 99 % -- --  12/21/22 1539 -- -- -- -- -- -- _1  (1.676 m) 87.2 kg      Physical Exam Vitals and nursing note reviewed.  Constitutional:      General: She is not in acute distress.    Appearance: She is well-developed.  HENT:     Head: Normocephalic.  Eyes:     Pupils: Pupils are equal, round, and reactive to light.  Cardiovascular:     Rate and Rhythm: Normal rate and regular rhythm.     Heart sounds: Normal heart sounds.  Pulmonary:     Effort: Pulmonary effort is normal. No respiratory distress.     Breath sounds: Normal breath sounds.  Abdominal:     General: Bowel sounds are normal. There is no distension.     Palpations:  Abdomen is soft.     Tenderness: There is no abdominal tenderness.  Skin:    General: Skin is warm and dry.  Neurological:     Mental Status: She is alert and oriented to person, place, and time.  Psychiatric:        Mood and Affect: Mood normal.        Behavior: Behavior normal.        Thought Content: Thought content normal.        Judgment: Judgment normal.        Laboratory:    Latest Ref Rng & Units 11/27/2022    1:13 PM 11/12/2022    3:35 PM 03/28/2022    7:19 PM  CBC  WBC 4.0 - 10.5 K/uL 6.1  7.2  7.5   Hemoglobin 12.0 - 15.0 g/dL 13.5  15.4  13.9   Hematocrit 36.0 - 46.0 % 41.0  44.2  41.1   Platelets 150 - 400 K/uL 268  289  279   A POS  Imaging:  Narrative & Impression  CLINICAL DATA:  Pregnant patient with pain.   EXAM: TRANSVAGINAL OB ULTRASOUND   TECHNIQUE: Transvaginal ultrasound was performed for complete evaluation of the gestation as well as the maternal uterus, adnexal regions, and pelvic cul-de-sac.   COMPARISON:  None Available.   FINDINGS: Intrauterine gestational sac: Single   Yolk sac:  No   Embryo:  Visualized   Cardiac Activity: None   CRL: 7.5 mm mm   6 w 4 d                  [place] EDC: August 12, 2023   Subchorionic hemorrhage:  A small subchorionic hemorrhage is noted.   Maternal uterus/adnexae: 2 simple cysts are identified in the right ovary. No follow-up imaging recommended for the cyst. The left ovary is not visualized.   IMPRESSION: 1. An IUP is identified. No cardiac activity is identified on today's study which is a change compared to the November 27, 2022 study. Findings meet definitive criteria for failed pregnancy. This follows SRU consensus guidelines: Diagnostic Criteria for Nonviable Pregnancy Early in the First Trimester. Alison Stalling J Med [phone].     Electronically Signed   By: Dorise Bullion III M.D.   On: 12/21/2022 17:40    Assessment: Ms. Delia is a 22 y.o. G2P0010 (Patient's last menstrual period  was 10/08/2022.) here for scheduled d&c for missed miscarriage  Plan: Plan to proceed with suction d&c and send Anora genetics.    Durene Romans MD Attending Center for [facility])

## 2022-12-26 NOTE — Progress Notes (Signed)
PCP - Denies Cardiologist - Dr. Phineas Inches  Chest x-ray - 03/28/22 EKG - 05/04/22  ERAS Protcol - Clears until 0715  Anesthesia review: N  Patient verbally denies any shortness of breath, fever, cough and chest pain during phone call   -------------  SDW INSTRUCTIONS given:  Your procedure is scheduled on 12/27/21.  Report to [facility] Main Entrance "A" at [place]., and check in at the Admitting office.  Call this number if you have problems the morning of surgery:  [phone]   Remember:  Do not eat after midnight the night before your surgery  You may drink clear liquids until 0715 the morning of your surgery.   Clear liquids allowed are: Water, Non-Citrus Juices (without pulp), Carbonated Beverages, Clear Tea, Black Coffee Only, and Gatorade    Take these medicines the morning of surgery with A SIP OF WATER  N/A  As of today, STOP taking any Aspirin (unless otherwise instructed by your surgeon) Aleve, Naproxen, Ibuprofen, Motrin, Advil, Goody's, BC's, all herbal medications, fish oil, and all vitamins.                      Do not wear jewelry, make up, or nail polish            Do not wear lotions, powders, perfumes/colognes, or deodorant.            Do not shave 48 hours prior to surgery.  Men may shave face and neck.            Do not bring valuables to the hospital.            [facility] is not responsible for any belongings or valuables.  Do NOT Smoke (Tobacco/Vaping) 24 hours prior to your procedure If you use a CPAP at night, you may bring all equipment for your overnight stay.   Contacts, glasses, dentures or bridgework may not be worn into surgery.      For patients admitted to the hospital, discharge time will be determined by your treatment team.   Patients discharged the day of surgery will not be allowed to drive home, and someone needs to stay with them for 24 hours.    Special instructions:   [place]- Preparing For Surgery  Before  surgery, you can play an important role. Because skin is not sterile, your skin needs to be as free of germs as possible. You can reduce the number of germs on your skin by washing with CHG (chlorahexidine gluconate) Soap before surgery.  CHG is an antiseptic cleaner which kills germs and bonds with the skin to continue killing germs even after washing.    Oral Hygiene is also important to reduce your risk of infection.  Remember - BRUSH YOUR TEETH THE MORNING OF SURGERY WITH YOUR REGULAR TOOTHPASTE  Please do not use if you have an allergy to CHG or antibacterial soaps. If your skin becomes reddened/irritated stop using the CHG.  Do not shave (including legs and underarms) for at least 48 hours prior to first CHG shower. It is OK to shave your face.  Please follow these instructions carefully.   Shower the NIGHT BEFORE SURGERY and the MORNING OF SURGERY with DIAL Soap.   Pat yourself dry with a CLEAN TOWEL.  Wear CLEAN PAJAMAS to bed the night before surgery  Place CLEAN SHEETS on your bed the night of your first shower and DO NOT SLEEP WITH PETS.   Day of Surgery:  Please shower morning of surgery  Wear Clean/Comfortable clothing the morning of surgery Do not apply any deodorants/lotions.   Remember to brush your teeth WITH YOUR REGULAR TOOTHPASTE.   Questions were answered. Patient verbalized understanding of instructions.

## 2022-12-26 NOTE — Progress Notes (Signed)
Spoke with the pt, she will arrive tom at 0600.

## 2022-12-27 ENCOUNTER — Ambulatory Visit (HOSPITAL_COMMUNITY)
Admission: RE | Admit: 2022-12-27 | Discharge: 2022-12-27 | Disposition: A | Discharge status: Home / Self Care | Payer: Medicaid Other | Attending: Obstetrics and Gynecology | Admitting: Obstetrics and Gynecology

## 2022-12-27 ENCOUNTER — Encounter (HOSPITAL_COMMUNITY)
Admission: RE | Disposition: A | Discharge status: Home / Self Care | Payer: Self-pay | Source: Home / Self Care | Attending: Obstetrics and Gynecology

## 2022-12-27 ENCOUNTER — Encounter (HOSPITAL_COMMUNITY): Payer: Self-pay | Admitting: Obstetrics and Gynecology

## 2022-12-27 ENCOUNTER — Ambulatory Visit (HOSPITAL_COMMUNITY): Payer: Medicaid Other | Admitting: Certified Registered Nurse Anesthetist

## 2022-12-27 ENCOUNTER — Ambulatory Visit (HOSPITAL_BASED_OUTPATIENT_CLINIC_OR_DEPARTMENT_OTHER): Payer: Medicaid Other | Admitting: Certified Registered Nurse Anesthetist

## 2022-12-27 ENCOUNTER — Other Ambulatory Visit: Payer: Self-pay

## 2022-12-27 DIAGNOSIS — Z3A11 11 weeks gestation of pregnancy: Secondary | ICD-10-CM | POA: Diagnosis not present

## 2022-12-27 DIAGNOSIS — O021 Missed abortion: Secondary | ICD-10-CM | POA: Diagnosis not present

## 2022-12-27 DIAGNOSIS — Z87891 Personal history of nicotine dependence: Secondary | ICD-10-CM | POA: Insufficient documentation

## 2022-12-27 DIAGNOSIS — Z9889 Other specified postprocedural states: Secondary | ICD-10-CM

## 2022-12-27 HISTORY — PX: DILATION AND EVACUATION: SHX1459

## 2022-12-27 HISTORY — DX: Attention-deficit hyperactivity disorder, unspecified type: F90.9

## 2022-12-27 SURGERY — DILATION AND EVACUATION, UTERUS
Anesthesia: General | Site: Vagina

## 2022-12-27 MED ORDER — ACETAMINOPHEN 500 MG PO TABS: 1000.0000 mg | ORAL_TABLET | Freq: Once | ORAL | Status: DC

## 2022-12-27 MED ORDER — LIDOCAINE HCL 1 % IJ SOLN
INTRAMUSCULAR | Status: DC | PRN
  Administered 2022-12-27: 20 mL

## 2022-12-27 MED ORDER — FENTANYL CITRATE (PF) 250 MCG/5ML IJ SOLN
INTRAMUSCULAR | Status: DC | PRN
  Administered 2022-12-27 (×2): 50 ug via INTRAVENOUS

## 2022-12-27 MED ORDER — 0.9 % SODIUM CHLORIDE (POUR BTL) OPTIME
TOPICAL | Status: DC | PRN
  Administered 2022-12-27: 1000 mL

## 2022-12-27 MED ORDER — DEXAMETHASONE SODIUM PHOSPHATE 10 MG/ML IJ SOLN
INTRAMUSCULAR | Status: AC
  Filled 2022-12-27: qty 1

## 2022-12-27 MED ORDER — ONDANSETRON HCL 4 MG/2ML IJ SOLN
INTRAMUSCULAR | Status: AC
  Filled 2022-12-27: qty 2

## 2022-12-27 MED ORDER — SILVER NITRATE-POT NITRATE 75-25 % EX MISC
CUTANEOUS | Status: AC
  Filled 2022-12-27: qty 10

## 2022-12-27 MED ORDER — DEXAMETHASONE SODIUM PHOSPHATE 10 MG/ML IJ SOLN
INTRAMUSCULAR | Status: DC | PRN
  Administered 2022-12-27: 10 mg via INTRAVENOUS

## 2022-12-27 MED ORDER — IBUPROFEN 600 MG PO TABS: 600.0000 mg | ORAL_TABLET | Freq: Four times a day (QID) | ORAL | 3 refills | Status: DC | PRN

## 2022-12-27 MED ORDER — FENTANYL CITRATE (PF) 100 MCG/2ML IJ SOLN
INTRAMUSCULAR | Status: AC
  Filled 2022-12-27: qty 2

## 2022-12-27 MED ORDER — PROPOFOL 10 MG/ML IV BOLUS
INTRAVENOUS | Status: AC
  Filled 2022-12-27: qty 20

## 2022-12-27 MED ORDER — FENTANYL CITRATE (PF) 100 MCG/2ML IJ SOLN
25.0000 ug | INTRAMUSCULAR | Status: DC | PRN
  Administered 2022-12-27 (×2): 50 ug via INTRAVENOUS
  Administered 2022-12-27 (×2): 25 ug via INTRAVENOUS

## 2022-12-27 MED ORDER — PROPOFOL 10 MG/ML IV BOLUS
INTRAVENOUS | Status: DC | PRN
  Administered 2022-12-27: 200 mg via INTRAVENOUS

## 2022-12-27 MED ORDER — LACTATED RINGERS IV SOLN: INTRAVENOUS | Status: DC

## 2022-12-27 MED ORDER — PHENYLEPHRINE 80 MCG/ML (10ML) SYRINGE FOR IV PUSH (FOR BLOOD PRESSURE SUPPORT)
PREFILLED_SYRINGE | INTRAVENOUS | Status: AC
  Filled 2022-12-27: qty 10

## 2022-12-27 MED ORDER — MIDAZOLAM HCL 2 MG/2ML IJ SOLN
INTRAMUSCULAR | Status: AC
  Filled 2022-12-27: qty 2

## 2022-12-27 MED ORDER — SODIUM CHLORIDE 0.9 % IV SOLN: INTRAVENOUS | Status: DC

## 2022-12-27 MED ORDER — MIDAZOLAM HCL 2 MG/2ML IJ SOLN
INTRAMUSCULAR | Status: DC | PRN
  Administered 2022-12-27: 2 mg via INTRAVENOUS

## 2022-12-27 MED ORDER — ONDANSETRON HCL 4 MG/2ML IJ SOLN
INTRAMUSCULAR | Status: DC | PRN
  Administered 2022-12-27: 4 mg via INTRAVENOUS

## 2022-12-27 MED ORDER — ORAL CARE MOUTH RINSE: 15.0000 mL | Freq: Once | OROMUCOSAL | Status: AC

## 2022-12-27 MED ORDER — LIDOCAINE HCL (PF) 1 % IJ SOLN
INTRAMUSCULAR | Status: AC
  Filled 2022-12-27: qty 30

## 2022-12-27 MED ORDER — FENTANYL CITRATE (PF) 250 MCG/5ML IJ SOLN
INTRAMUSCULAR | Status: AC
  Filled 2022-12-27: qty 5

## 2022-12-27 MED ORDER — OXYCODONE HCL 5 MG PO TABS: 5.0000 mg | ORAL_TABLET | Freq: Once | ORAL | Status: AC | PRN

## 2022-12-27 MED ORDER — ACETAMINOPHEN 10 MG/ML IV SOLN
INTRAVENOUS | Status: DC | PRN
  Administered 2022-12-27: 1000 mg via INTRAVENOUS

## 2022-12-27 MED ORDER — OXYCODONE HCL 5 MG/5ML PO SOLN
ORAL | Status: AC
  Filled 2022-12-27: qty 5

## 2022-12-27 MED ORDER — KETOROLAC TROMETHAMINE 30 MG/ML IJ SOLN
INTRAMUSCULAR | Status: DC | PRN
  Administered 2022-12-27: 30 mg via INTRAVENOUS

## 2022-12-27 MED ORDER — SCOPOLAMINE 1 MG/3DAYS TD PT72
1.0000 | MEDICATED_PATCH | TRANSDERMAL | Status: DC
  Administered 2022-12-27: 1 via TRANSDERMAL

## 2022-12-27 MED ORDER — ACETAMINOPHEN 10 MG/ML IV SOLN
INTRAVENOUS | Status: AC
  Filled 2022-12-27: qty 100

## 2022-12-27 MED ORDER — LIDOCAINE 2% (20 MG/ML) 5 ML SYRINGE
INTRAMUSCULAR | Status: DC | PRN
  Administered 2022-12-27: 40 mg via INTRAVENOUS

## 2022-12-27 MED ORDER — KETOROLAC TROMETHAMINE 30 MG/ML IJ SOLN
INTRAMUSCULAR | Status: AC
  Filled 2022-12-27: qty 1

## 2022-12-27 MED ORDER — ACETAMINOPHEN 160 MG/5ML PO SOLN: 325.0000 mg | ORAL | Status: DC | PRN

## 2022-12-27 MED ORDER — OXYCODONE HCL 5 MG/5ML PO SOLN
5.0000 mg | Freq: Once | ORAL | Status: AC | PRN
  Administered 2022-12-27: 5 mg via ORAL

## 2022-12-27 MED ORDER — AMISULPRIDE (ANTIEMETIC) 5 MG/2ML IV SOLN: 10.0000 mg | Freq: Once | INTRAVENOUS | Status: DC | PRN

## 2022-12-27 MED ORDER — ACETAMINOPHEN 10 MG/ML IV SOLN: 1000.0000 mg | Freq: Once | INTRAVENOUS | Status: DC | PRN

## 2022-12-27 MED ORDER — LIDOCAINE 2% (20 MG/ML) 5 ML SYRINGE
INTRAMUSCULAR | Status: AC
  Filled 2022-12-27: qty 5

## 2022-12-27 MED ORDER — CHLORHEXIDINE GLUCONATE 0.12 % MT SOLN
15.0000 mL | Freq: Once | OROMUCOSAL | Status: AC
  Administered 2022-12-27: 15 mL via OROMUCOSAL
  Filled 2022-12-27: qty 15

## 2022-12-27 MED ORDER — ACETAMINOPHEN 325 MG PO TABS: 325.0000 mg | ORAL_TABLET | ORAL | Status: DC | PRN

## 2022-12-27 MED ORDER — PROMETHAZINE HCL 25 MG/ML IJ SOLN: 6.2500 mg | INTRAMUSCULAR | Status: DC | PRN

## 2022-12-27 MED ORDER — SILVER NITRATE-POT NITRATE 75-25 % EX MISC
CUTANEOUS | Status: DC | PRN
  Administered 2022-12-27: 1

## 2022-12-27 SURGICAL SUPPLY — 19 items
CATH ROBINSON RED A/P 16FR (CATHETERS) IMPLANT
FILTER UTR ASPR ASSEMBLY (MISCELLANEOUS) ×1 IMPLANT
GLOVE BIOGEL PI IND STRL 7.0 (GLOVE) ×1 IMPLANT
GLOVE BIOGEL PI IND STRL 7.5 (GLOVE) ×1 IMPLANT
GOWN STRL REUS W/ TWL LRG LVL3 (GOWN DISPOSABLE) ×1 IMPLANT
GOWN STRL REUS W/ TWL XL LVL3 (GOWN DISPOSABLE) ×1 IMPLANT
GOWN STRL REUS W/TWL LRG LVL3 (GOWN DISPOSABLE) ×1
GOWN STRL REUS W/TWL XL LVL3 (GOWN DISPOSABLE) ×1
HOSE CONNECTING 18IN BERKELEY (TUBING) ×1 IMPLANT
KIT BERKELEY 1ST TRI 3/8 NO TR (MISCELLANEOUS) ×1 IMPLANT
KIT BERKELEY 1ST TRIMESTER 3/8 (MISCELLANEOUS) ×1 IMPLANT
NS IRRIG 1000ML POUR BTL (IV SOLUTION) ×1 IMPLANT
PACK VAGINAL MINOR WOMEN LF (CUSTOM PROCEDURE TRAY) ×1 IMPLANT
PAD OB MATERNITY 4.3X12.25 (PERSONAL CARE ITEMS) ×1 IMPLANT
SET BERKELEY SUCTION TUBING (SUCTIONS) ×1 IMPLANT
TOWEL GREEN STERILE FF (TOWEL DISPOSABLE) ×2 IMPLANT
VACURETTE 7MM CVD STRL WRAP (CANNULA) IMPLANT
VACURETTE 8 RIGID CVD (CANNULA) IMPLANT
VACURETTE 9 RIGID CVD (CANNULA) IMPLANT

## 2022-12-27 NOTE — Discharge Instructions (Addendum)
   We will discuss your surgery once again in detail at your post-op visit in two to four weeks. If you haven't already done so, please call to make your appointment as soon as possible.  Dilation and Curettage or Vacuum Curettage, Care After These instructions give you information on caring for yourself after your procedure. Your doctor may also give you more specific instructions. Call your doctor if you have any problems or questions after your procedure. HOME CARE Do not drive for 24 hours. Wait 1 week before doing any activities that wear you out. Do not stand for a long time. Limit stair climbing to once or twice a day. Rest often. Continue with your usual diet. Drink enough fluids to keep your pee (urine) clear or pale yellow. If you have a hard time pooping (constipation), you may: Take a medicine to help you go poop (laxative) as told by your doctor. Eat more fruit and bran. Drink more fluids. Take showers, not baths, for as long as told by your doctor. Do not swim or use a hot tub until your doctor says it is okay. Have someone with you for 1day after the procedure. Do not douche, use tampons, or have sex (intercourse) until seen by your doctor Only take medicines as told by your doctor. Do not take aspirin. It can cause bleeding. Keep all doctor visits. GET HELP IF: You have cramps or pain not helped by medicine. You have new pain in the belly (abdomen). You have a bad smelling fluid coming from your vagina. You have a rash. You have problems with any medicine. GET HELP RIGHT AWAY IF:  You start to bleed more than a regular period. You have a fever. You have chest pain. You have trouble breathing. You feel dizzy or feel like passing out (fainting). You pass out. You have pain in the tops of your shoulders. You have vaginal bleeding with or without clumps of blood (blood clots). MAKE SURE YOU: Understand these instructions. Will watch your condition. Will get help  right away if you are not doing well or get worse. Document Released: 09/19/2008 Document Revised: 12/16/2013 Document Reviewed: 07/10/2013 ExitCare Patient Information 2015 [employer]. This information is not intended to replace advice given to you by your health care provider. Make sure you discuss any questions you have with your health care provider.   

## 2022-12-27 NOTE — Op Note (Signed)
Operative Note   12/27/2022  PRE-OP DIAGNOSIS: 7 week missed abortion   POST-OP DIAGNOSIS: same  SURGEON: Surgeon(s) and Role:    * Aletha Halim, MD - Primary  ASSISTANT: Mikki Santee, MD (OB Fellow)  PROCEDURE:  Suction dilation and curettage  ANESTHESIA: LMA and paracervical block  ESTIMATED BLOOD LOSS: 478mL  DRAINS: I/O foley  TOTAL IV FLUIDS: per anesthesia note  SPECIMENS: products of conception to pathology and products of conception to [facility] for Anora testing.   VTE PROPHYLAXIS: SCDs to the bilateral lower extremities  ANTIBIOTICS: Doxycycline 200mg  IV x 1 pre op  COMPLICATIONS: none  DISPOSITION: PACU - hemodynamically stable.  CONDITION: stable  BLOOD TYPE: A POS. Rhogam given:not applicable  FINDINGS: Exam under anesthesia revealed 8 week sized uterus with no masses and bilateral adnexa without masses or fullness. Necrotic appearing products of conception were seen, with gritty texture in all four quadrants.   PROCEDURE IN DETAIL:  After informed consent was obtained, the patient was taken to the operating room where anesthesia was obtained without difficulty. The patient was positioned in the dorsal lithotomy position in [place]. The patient was examined under anesthesia, with the above noted findings.  The bi-valved speculum was placed inside the patient's vagina, and the the anterior lip of the cervix was seen and grasped with the tenaculum.  A paracervical block was achieved with 41mL of 1% lidocaine and then the cervix was progressively dilated to a 21 French-Pratt dilator.  The suction was then calibrated to 77mmHg and connected to the number 7 cannula, which was then introduced with the above noted findings; she eventually was able to fit a #9 cannula without further need for dilators. A gentle curettage was done at the end and yield no products of conception.   The suction was then done one more time to remove any remaining curettage material.    Excellent hemostasis was noted, and all instruments were removed, with excellent hemostasis noted throughout.  She was then taken out of dorsal lithotomy. The patient tolerated the procedure well.  Sponge, lap and instrument counts were correct x2.  The patient was taken to recovery room in excellent condition.  Durene Romans MD Attending Center for [employer] [facility])

## 2022-12-27 NOTE — Interval H&P Note (Signed)
History and Physical Interval Note:  12/27/2022 7:53 AM  Ellen Huffman  has presented today for surgery, with the diagnosis of missed ab.  The various methods of treatment have been discussed with the patient and family. After consideration of risks, benefits and other options for treatment, the patient has consented to  Procedure(s): DILATATION AND EVACUATION WITH ANORA GENETIC TESTING (N/A) as a surgical intervention.  The patient's history has been reviewed, patient examined, no change in status, stable for surgery.  I have reviewed the patient's chart and labs.  Questions were answered to the patient's satisfaction.     Aletha Halim

## 2022-12-27 NOTE — Transfer of Care (Signed)
Immediate Anesthesia Transfer of Care Note  Patient: Ellen Huffman  Procedure(s) Performed: DILATATION AND EVACUATION WITH ANORA GENETIC TESTING (Vagina )  Patient Location: PACU  Anesthesia Type:General  Level of Consciousness: awake, alert , and oriented  Airway & Oxygen Therapy: Patient Spontanous Breathing and Patient connected to nasal cannula oxygen  Post-op Assessment: Report given to RN and Post -op Vital signs reviewed and stable  Post vital signs: Reviewed and stable  Last Vitals:  Vitals Value Taken Time  BP 121/80 12/27/22 0930  Temp    Pulse 109 12/27/22 0933  Resp 15 12/27/22 0933  SpO2 95 % 12/27/22 0933  Vitals shown include unvalidated device data.  Last Pain:  Vitals:   12/27/22 0650  TempSrc:   PainSc: 0-No pain      Patients Stated Pain Goal: 0 (35/32/99 2426)  Complications: No notable events documented.

## 2022-12-27 NOTE — Anesthesia Preprocedure Evaluation (Addendum)
Anesthesia Evaluation  Patient identified by MRN, date of birth, ID band Patient awake    Reviewed: Allergy & Precautions, NPO status , Patient's Chart, lab work & pertinent test results  Airway Mallampati: II  TM Distance: >3 FB Neck ROM: Full    Dental  (+) Teeth Intact, Dental Advisory Given   Pulmonary former smoker   breath sounds clear to auscultation       Cardiovascular negative cardio ROS  Rhythm:Regular Rate:Normal     Neuro/Psych  Headaches, Seizures -,  PSYCHIATRIC DISORDERS Anxiety Depression Bipolar Disorder      GI/Hepatic negative GI ROS, Neg liver ROS,,,  Endo/Other  negative endocrine ROS    Renal/GU negative Renal ROS     Musculoskeletal negative musculoskeletal ROS (+)    Abdominal   Peds  Hematology negative hematology ROS (+)   Anesthesia Other Findings   Reproductive/Obstetrics                             Anesthesia Physical Anesthesia Plan  ASA: 2  Anesthesia Plan: General   Post-op Pain Management: Tylenol PO (pre-op)*   Induction: Intravenous  PONV Risk Score and Plan: 4 or greater and Ondansetron, Dexamethasone, Midazolam and Scopolamine patch - Pre-op  Airway Management Planned: LMA  Additional Equipment: None  Intra-op Plan:   Post-operative Plan: Extubation in OR  Informed Consent: I have reviewed the patients History and Physical, chart, labs and discussed the procedure including the risks, benefits and alternatives for the proposed anesthesia with the patient or authorized representative who has indicated his/her understanding and acceptance.     Dental advisory given  Plan Discussed with: CRNA  Anesthesia Plan Comments:        Anesthesia Quick Evaluation

## 2022-12-27 NOTE — Anesthesia Postprocedure Evaluation (Signed)
Anesthesia Post Note  Patient: Ellen Huffman  Procedure(s) Performed: DILATATION AND EVACUATION WITH ANORA GENETIC TESTING (Vagina )     Patient location during evaluation: PACU Anesthesia Type: General Level of consciousness: awake and alert Pain management: pain level controlled Vital Signs Assessment: post-procedure vital signs reviewed and stable Respiratory status: spontaneous breathing, nonlabored ventilation, respiratory function stable and patient connected to nasal cannula oxygen Cardiovascular status: blood pressure returned to baseline and stable Postop Assessment: no apparent nausea or vomiting Anesthetic complications: no   No notable events documented.  Last Vitals:  Vitals:   12/27/22 1015 12/27/22 1022  BP: 122/70 97/73  Pulse: 93 72  Resp: (!) 23 14  Temp:  37.1 C  SpO2: 96% 95%    Last Pain:  Vitals:   12/27/22 1000  TempSrc:   PainSc: [place]

## 2022-12-27 NOTE — Anesthesia Procedure Notes (Signed)
Procedure Name: LMA Insertion Date/Time: 12/27/2022 8:39 AM  Performed by: Dorthea Cove, CRNAPre-anesthesia Checklist: Patient identified, Emergency Drugs available, Suction available and Patient being monitored Patient Re-evaluated:Patient Re-evaluated prior to induction Oxygen Delivery Method: Circle System Utilized Preoxygenation: Pre-oxygenation with 100% oxygen Induction Type: IV induction Ventilation: Mask ventilation without difficulty LMA: LMA inserted LMA Size: 4.0 Number of attempts: 1 Airway Equipment and Method: Bite block Placement Confirmation: positive ETCO2 Tube secured with: Tape Dental Injury: Teeth and Oropharynx as per pre-operative assessment

## 2022-12-28 ENCOUNTER — Encounter (HOSPITAL_COMMUNITY): Payer: Self-pay | Admitting: Obstetrics and Gynecology

## 2022-12-28 LAB — SURGICAL PATHOLOGY

## 2023-01-05 ENCOUNTER — Encounter: Payer: Self-pay | Admitting: Obstetrics and Gynecology

## 2023-01-12 DIAGNOSIS — Z3401 Encounter for supervision of normal first pregnancy, first trimester: Secondary | ICD-10-CM | POA: Diagnosis not present

## 2023-01-18 ENCOUNTER — Encounter: Payer: Self-pay | Admitting: Obstetrics and Gynecology

## 2023-01-18 ENCOUNTER — Other Ambulatory Visit (HOSPITAL_COMMUNITY)
Admission: RE | Admit: 2023-01-18 | Discharge: 2023-01-18 | Disposition: A | Discharge status: Home / Self Care | Payer: 59 | Source: Ambulatory Visit | Attending: Obstetrics and Gynecology | Admitting: Obstetrics and Gynecology

## 2023-01-18 ENCOUNTER — Ambulatory Visit (INDEPENDENT_AMBULATORY_CARE_PROVIDER_SITE_OTHER): Payer: 59 | Admitting: Obstetrics and Gynecology

## 2023-01-18 VITALS — BP 128/89 | HR 91

## 2023-01-18 DIAGNOSIS — N96 Recurrent pregnancy loss: Secondary | ICD-10-CM | POA: Diagnosis not present

## 2023-01-18 DIAGNOSIS — Z113 Encounter for screening for infections with a predominantly sexual mode of transmission: Secondary | ICD-10-CM

## 2023-01-18 NOTE — Progress Notes (Signed)
GYNECOLOGY VISIT  Patient name: Ellen Huffman MRN [id number]  Date of birth: 2001/09/10 Chief Complaint:   Follow-up  History:  Ellen Huffman is a 22 y.o. G2P0010 being seen today for SAB followup.  Bleeding this week for about 2 days with some craping. No fever, chills, no abnormal discharge or nausea or vomitnhg. Hoping to get pregnant again. Does not want labs done today. Abstinenace for now - no longer with partner she was pregnant by due to infidelity (partner), etc.   Past Medical History:  Diagnosis Date   ADHD (attention deficit hyperactivity disorder)    Allergy    Anxiety    Bipolar disorder ([place])    Depression    Mental disorder    Overdose 12/25/2013   Seizures ([place]) 2022   drug withdrawl    Past Surgical History:  Procedure Laterality Date   DILATION AND CURETTAGE OF UTERUS N/A 09/27/2021   Procedure: DILATATION AND CURETTAGE WITH FROZEN WITH ULTRA SOUND;  Surgeon: Aletha Halim, MD;  Location: [place];  Service: Gynecology;  Laterality: N/A;   DILATION AND EVACUATION N/A 12/27/2022   Procedure: DILATATION AND EVACUATION WITH ANORA GENETIC TESTING;  Surgeon: Aletha Halim, MD;  Location: [place];  Service: Gynecology;  Laterality: N/A;    The following portions of the patient's history were reviewed and updated as appropriate: allergies, current medications, past family history, past medical history, past social history, past surgical history and problem list.   Health Maintenance:   Last pap none Last mammogram: n/a   Review of Systems:  Pertinent items are noted in HPI. Comprehensive review of systems was otherwise negative.   Objective:  Physical Exam BP 128/89   Pulse 91   LMP 10/08/2022    Physical Exam   Labs and Imaging US OB Transvaginal  Result Date: 12/21/2022 CLINICAL DATA:  Pregnant patient with pain. EXAM: TRANSVAGINAL OB ULTRASOUND TECHNIQUE: Transvaginal ultrasound was performed for complete evaluation of the gestation as well as the  maternal uterus, adnexal regions, and pelvic cul-de-sac. COMPARISON:  None Available. FINDINGS: Intrauterine gestational sac: Single Yolk sac:  No Embryo:  Visualized Cardiac Activity: None CRL: 7.5 mm mm   6 w 4 d                  [place] EDC: August 12, 2023 Subchorionic hemorrhage:  A small subchorionic hemorrhage is noted. Maternal uterus/adnexae: 2 simple cysts are identified in the right ovary. No follow-up imaging recommended for the cyst. The left ovary is not visualized. IMPRESSION: 1. An IUP is identified. No cardiac activity is identified on today's study which is a change compared to the November 27, 2022 study. Findings meet definitive criteria for failed pregnancy. This follows SRU consensus guidelines: Diagnostic Criteria for Nonviable Pregnancy Early in the First Trimester. Alison Stalling J Med [phone]. Electronically Signed   By: Dorise Bullion III M.D.   On: 12/21/2022 17:40       Assessment & Plan:   1. Recurrent pregnancy loss Discussed RPL - unclear if will recur if next pregnancy with a different partner. Can do labs regarding understanding possible cause of 2 SABs. Normal genetic testing of POC from last miscarriage.  - Hemoglobin A1c; Future - TSH Rfx on Abnormal to Free T4; Future - Lupus Anticoag/Cardiolipin Ab; Future  2. Routine screening for STI (sexually transmitted infection) Offered screening given partner known infidelity with subsequent pregnancy  - Cervicovaginal ancillary only - RPR+HBsAg+HCVAb+...; Future   Routine preventative health maintenance measures emphasized.  Ellen Huffman  Currie Paris, MD Minim[facility] In[facility] Gynecologic [facility] [place]

## 2023-01-19 LAB — CERVICOVAGINAL ANCILLARY ONLY
Chlamydia: NEGATIVE
Comment: NEGATIVE
Comment: NEGATIVE
Comment: NORMAL
Neisseria Gonorrhea: NEGATIVE
Trichomonas: NEGATIVE

## 2023-01-22 LAB — ANORA MISCARRIAGE TEST - FRESH

## 2023-01-23 ENCOUNTER — Telehealth: Payer: Self-pay

## 2023-01-23 NOTE — Telephone Encounter (Addendum)
-----  Message from Darliss Cheney, MD sent at 01/19/2023  1:13 PM EST ----- Regarding: labs Hey,  So can you contact for lab visit for STI testing. Also looks like I missed that she's due for pap  Best,  Marchia Bond    Called pt; lab scheduled for 01/29/23. Reviewed need for Pap. Pt states she will schedule with provider once recovered from miscarriage.

## 2023-01-26 ENCOUNTER — Encounter: Payer: Self-pay | Admitting: Certified Nurse Midwife

## 2023-01-29 ENCOUNTER — Other Ambulatory Visit: Payer: Medicaid Other

## 2023-01-29 DIAGNOSIS — Z113 Encounter for screening for infections with a predominantly sexual mode of transmission: Secondary | ICD-10-CM

## 2023-01-29 DIAGNOSIS — N96 Recurrent pregnancy loss: Secondary | ICD-10-CM

## 2023-01-30 LAB — TSH RFX ON ABNORMAL TO FREE T4: TSH: 1.03 u[IU]/mL (ref 0.450–4.500)

## 2023-02-07 ENCOUNTER — Encounter: Payer: Self-pay | Admitting: Obstetrics and Gynecology

## 2023-02-07 LAB — LUPUS ANTICOAG/CARDIOLIPIN AB
APTT: 29.2 s
Anticardiolipin Ab, IgG: <10 [GPL'U]
Anticardiolipin Ab, IgM: <10 [MPL'U]
Beta-2 Glycoprotein I, IgA: <10 SAU
Beta-2 Glycoprotein I, IgG: <10 SGU
Beta-2 Glycoprotein I, IgM: <10 SMU
DRVVT Screen Seconds: 34.8 s
Hexagonal Phospholipid Neutral: 3 s
INR: 1 ratio
Prothrombin Time: 10.9 s
Thrombin Time: 15.8 s

## 2023-02-07 LAB — HEMOGLOBIN A1C
Est. average glucose Bld gHb Est-mCnc: 85 mg/dL
Hgb A1c MFr Bld: 4.6 % — ABNORMAL LOW (ref 4.8–5.6)

## 2023-02-07 LAB — RPR+HBSAG+HCVAB+...
HIV Screen 4th Generation wRfx: NONREACTIVE
Hep C Virus Ab: NONREACTIVE
Hepatitis B Surface Ag: NEGATIVE
RPR Ser Ql: NONREACTIVE

## 2023-02-09 ENCOUNTER — Encounter: Payer: Self-pay | Admitting: Obstetrics and Gynecology

## 2023-02-23 ENCOUNTER — Other Ambulatory Visit: Payer: Medicaid Other

## 2023-03-20 ENCOUNTER — Other Ambulatory Visit: Payer: Self-pay

## 2023-03-20 ENCOUNTER — Ambulatory Visit (INDEPENDENT_AMBULATORY_CARE_PROVIDER_SITE_OTHER): Payer: 59

## 2023-03-20 ENCOUNTER — Ambulatory Visit
Admission: EM | Admit: 2023-03-20 | Discharge: 2023-03-20 | Disposition: A | Discharge status: Home / Self Care | Payer: 59 | Attending: Internal Medicine | Admitting: Internal Medicine

## 2023-03-20 ENCOUNTER — Encounter: Payer: Self-pay | Admitting: Emergency Medicine

## 2023-03-20 DIAGNOSIS — M25531 Pain in right wrist: Secondary | ICD-10-CM | POA: Diagnosis not present

## 2023-03-20 DIAGNOSIS — M79641 Pain in right hand: Secondary | ICD-10-CM
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
## 2023-03-20 NOTE — Discharge Instructions (Addendum)
X-rays were normal.  Brace has been applied.  Apply ice and elevate extremity.  Follow-up with hand specialty for further evaluation and management.

## 2023-05-14 ENCOUNTER — Inpatient Hospital Stay (HOSPITAL_COMMUNITY)
Admission: AD | Admit: 2023-05-14 | Discharge: 2023-05-14 | Disposition: A | Discharge status: Home / Self Care | Payer: 59 | Attending: Obstetrics and Gynecology | Admitting: Obstetrics and Gynecology

## 2023-05-14 ENCOUNTER — Encounter (HOSPITAL_COMMUNITY): Payer: Self-pay | Admitting: Obstetrics and Gynecology

## 2023-05-14 ENCOUNTER — Inpatient Hospital Stay (HOSPITAL_COMMUNITY): Payer: 59

## 2023-05-14 DIAGNOSIS — O3680X0 Pregnancy with inconclusive fetal viability, not applicable or unspecified: Secondary | ICD-10-CM | POA: Diagnosis not present

## 2023-05-14 DIAGNOSIS — Z3A01 Less than 8 weeks gestation of pregnancy: Secondary | ICD-10-CM | POA: Diagnosis not present

## 2023-05-14 DIAGNOSIS — R109 Unspecified abdominal pain: Secondary | ICD-10-CM | POA: Diagnosis not present

## 2023-05-14 DIAGNOSIS — Q513 Bicornate uterus: Secondary | ICD-10-CM | POA: Diagnosis not present

## 2023-05-14 DIAGNOSIS — Z3A00 Weeks of gestation of pregnancy not specified: Secondary | ICD-10-CM | POA: Diagnosis not present

## 2023-05-14 DIAGNOSIS — O26891 Other specified pregnancy related conditions, first trimester: Secondary | ICD-10-CM | POA: Diagnosis not present

## 2023-05-14 DIAGNOSIS — O3401 Maternal care for unspecified congenital malformation of uterus, first trimester: Secondary | ICD-10-CM | POA: Diagnosis not present

## 2023-05-14 LAB — CBC
HCT: 42 % (ref 36.0–46.0)
Hemoglobin: 14.1 g/dL (ref 12.0–15.0)
MCH: 30.1 pg (ref 26.0–34.0)
MCHC: 33.6 g/dL (ref 30.0–36.0)
MCV: 89.6 fL (ref 80.0–100.0)
Platelets: 266 10*3/uL (ref 150–400)
RBC: 4.69 MIL/uL (ref 3.87–5.11)
RDW: 13.5 % (ref 11.5–15.5)
WBC: 5.2 10*3/uL (ref 4.0–10.5)
nRBC: 0 % (ref 0.0–0.2)

## 2023-05-14 LAB — URINALYSIS, ROUTINE W REFLEX MICROSCOPIC
Bilirubin Urine: NEGATIVE
Glucose, UA: NEGATIVE mg/dL
Hgb urine dipstick: NEGATIVE
Ketones, ur: NEGATIVE mg/dL
Leukocytes,Ua: NEGATIVE
Nitrite: NEGATIVE
Protein, ur: NEGATIVE mg/dL
Specific Gravity, Urine: 1.01 (ref 1.005–1.030)
pH: 7 (ref 5.0–8.0)

## 2023-05-14 LAB — WET PREP, GENITAL
Clue Cells Wet Prep HPF POC: NONE SEEN
Sperm: NONE SEEN
Trich, Wet Prep: NONE SEEN
WBC, Wet Prep HPF POC: <10 (ref <10)
Yeast Wet Prep HPF POC: NONE SEEN

## 2023-05-14 LAB — HCG, QUANTITATIVE, PREGNANCY: hCG, Beta Chain, Quant, S: 263 m[IU]/mL — ABNORMAL HIGH (ref <5)

## 2023-05-14 LAB — POCT PREGNANCY, URINE: Preg Test, Ur: POSITIVE — AB

## 2023-05-14 NOTE — MAU Provider Note (Addendum)
History    Chief Complaint  Patient presents with   Abdominal Pain   HPI Patient presents for abdominal cramping for several days and LMP on 04/07/2023. UPT + in MAU. Last IC this past Saturday. Denies VB, vaginal discharge, odor. No urinary symptoms. No fevers, NVD, decreased appetite. Hx of 2 AB requiring DNC, most recently in 12/2022. No current medications per patient.   This is an unexpected but desired pregnancy.  OB History     Gravida  3   Para      Term      Preterm      AB  2   Living         SAB  2   IAB      Ectopic      Multiple      Live Births              Past Medical History:  Diagnosis Date   ADHD (attention deficit hyperactivity disorder)    Allergy    Anxiety    Bipolar disorder ([facility])    Depression    Mental disorder    Overdose 12/25/2013   Seizures ([facility]) 2022   drug withdrawl    Past Surgical History:  Procedure Laterality Date   DILATION AND CURETTAGE OF UTERUS N/A 09/27/2021   Procedure: DILATATION AND CURETTAGE WITH FROZEN WITH ULTRA SOUND;  Surgeon: Hoagland Bing, MD;  [facility];  Service: Gynecology;  Laterality: N/A;   DILATION AND EVACUATION N/A 12/27/2022   Procedure: DILATATION AND EVACUATION WITH ANORA GENETIC TESTING;  Surgeon: Wharton Bing, MD;  Location: [facility];  Service: Gynecology;  Laterality: N/A;    Family History  Problem Relation Age of Onset   Lupus Mother    Hyperlipidemia Father    Migraines Neg Hx    Parkinsonism Neg Hx    Seizures Neg Hx    ADD / ADHD Neg Hx    Bipolar disorder Neg Hx     Social History   Tobacco Use   Smoking status: Former    Types: Cigarettes    Passive exposure: Never   Smokeless tobacco: Never   Tobacco comments:    Quit Jan 2023    quit vaping with preg  Vaping Use   Vaping Use: Some days   Substances: Nicotine, Flavoring  Substance Use Topics   Alcohol use: Not Currently    Comment: drinks on the weekends   Drug use: Not Currently    Types:  Marijuana    Comment: percocet, last use marijuana 04/2021    Allergies:  Allergies  Allergen Reactions   Penicillins Rash    Medications Prior to Admission  Medication Sig Dispense Refill Last Dose   ibuprofen (ADVIL) 600 MG tablet Take 1 tablet (600 mg total) by mouth every 6 (six) hours as needed. 60 tablet 3    prenatal vitamin w/FE, FA (PRENATAL 1 + 1) 27-1 MG TABS tablet Take 1 tablet by mouth daily at 12 noon. (Patient not taking: Reported on 12/22/2022) 90 tablet 3    ROS: Per HPI Physical Exam Blood pressure 122/68, pulse 92, temperature 98.2 F (36.8 C), temperature source Oral, resp. rate 14, height 5\' 6"  (1.676 m), weight 89.5 kg, last menstrual period 04/07/2023, SpO2 98 %, unknown if currently breastfeeding. Physical Exam Constitutional:      General: She is not in acute distress. HENT:     Head: Normocephalic and atraumatic.  Cardiovascular:     Rate and Rhythm: Normal rate  and regular rhythm.     Heart sounds: Normal heart sounds.  Pulmonary:     Effort: Pulmonary effort is normal.     Breath sounds: Normal breath sounds.  Neurological:     General: No focal deficit present.     Mental Status: She is alert and oriented to person, place, and time.  Psychiatric:        Mood and Affect: Mood normal.        Behavior: Behavior normal.     MAU Course No procedures.  MDM  1200 Cramping, UPT + in MAU. Ectopic r/o. CBC,Ultrasound, UA, GC/Chlamydia, wet prep ordered.   1315 CBC/UA unremarkable. hCG 263 with a visible gestational sac, c/f failed gestation with incomplete abortion or ectopic pregnancy with decidual reaction in the uterus.pregnancy. Cannot completely r/o normal IUP. Discussed options with patient. Plan for hCG recheck in MAU for Wed 5/22. Return precautions discussed. If hCG levels trend down, this is likely nonviable pregnancy and consider repeat imaging to r/o ectopic.  --CNM at bedside to support Resident assessment. Discussed with patient my  concern tahat gestational sac is visible today but quant hCG is 263. Although GS is present today, will have patient return for stat Quant hCG due to concern for nonviable pregnancy. Patient agreeable but states she cannot accommodate office appointment, will come to MAU Wednesday evening 05/16/2023.  Results for orders placed or performed during the hospital encounter of 05/14/23 (from the past 24 hour(s))  Urinalysis, Routine w reflex microscopic -Urine, Clean Catch     Status: Abnormal   Collection Time: 05/14/23 11:44 AM  Result Value Ref Range   Color, Urine YELLOW YELLOW   APPearance HAZY (A) CLEAR   Specific Gravity, Urine 1.010 1.005 - 1.030   pH 7.0 5.0 - 8.0   Glucose, UA NEGATIVE NEGATIVE mg/dL   Hgb urine dipstick NEGATIVE NEGATIVE   Bilirubin Urine NEGATIVE NEGATIVE   Ketones, ur NEGATIVE NEGATIVE mg/dL   Protein, ur NEGATIVE NEGATIVE mg/dL   Nitrite NEGATIVE NEGATIVE   Leukocytes,Ua NEGATIVE NEGATIVE  Pregnancy, urine POC     Status: Abnormal   Collection Time: 05/14/23 11:49 AM  Result Value Ref Range   Preg Test, Ur POSITIVE (A) NEGATIVE  CBC     Status: None   Collection Time: 05/14/23 12:00 PM  Result Value Ref Range   WBC 5.2 4.0 - 10.5 K/uL   RBC 4.69 3.87 - 5.11 MIL/uL   Hemoglobin 14.1 12.0 - 15.0 g/dL   HCT 16.1 09.6 - 04.5 %   MCV 89.6 80.0 - 100.0 fL   MCH 30.1 26.0 - 34.0 pg   MCHC 33.6 30.0 - 36.0 g/dL   RDW 40.9 81.1 - 91.4 %   Platelets 266 150 - 400 K/uL   nRBC 0.0 0.0 - 0.2 %  hCG, quantitative, pregnancy     Status: Abnormal   Collection Time: 05/14/23 12:00 PM  Result Value Ref Range   hCG, Beta Chain, Quant, S 263 (H) <5 mIU/mL  Wet prep, genital     Status: None   Collection Time: 05/14/23 12:00 PM   Specimen: PATH Cytology Cervicovaginal Ancillary Only  Result Value Ref Range   Yeast Wet Prep HPF POC NONE SEEN NONE SEEN   Trich, Wet Prep NONE SEEN NONE SEEN   Clue Cells Wet Prep HPF POC NONE SEEN NONE SEEN   WBC, Wet Prep HPF POC <10  <10   Sperm NONE SEEN    US OB LESS THAN 14 WEEKS WITH  OB TRANSVAGINAL  Result Date: 05/14/2023 CLINICAL DATA:  Pelvic pain EXAM: OBSTETRIC <14 WK [place] AND TRANSVAGINAL OB US TECHNIQUE: Both transabdominal and transvaginal ultrasound examinations were performed for complete evaluation of the gestation as well as the maternal uterus, adnexal regions, and pelvic cul-de-sac. Transvaginal technique was performed to assess early pregnancy. COMPARISON:  None Available. FINDINGS: Intrauterine gestational sac: Single. Endometrial stripe within the fundus isn't evaluated suggesting bicornuate or septate uterus. Yolk sac:  Not seen Embryo:  Not seen Cardiac Activity: Not seen Subchorionic hemorrhage:  None visualized. Maternal uterus/adnexae: There is a 1.8 x 1.4 cm simple appearing cyst in the margin of the right ovary. There is no free fluid in pelvis. IMPRESSION: There is a gestational sac in the left side of the fundus of the uterus without fetal pole or cardiac activity. Differential diagnostic possibilities would include very early normal IUP or failed gestation with incomplete abortion or ectopic pregnancy with decidual reaction in the uterus. Serial HCG estimations and short-term follow-up study in 1-2 weeks may be considered. 1.8 cm cyst in the right adnexa may suggest dominant follicle or functional cyst. Electronically Signed   By: Ernie Avena M.D.   On: 05/14/2023 13:06    Assessment & Plan --21 y.o. [id number] with + gestational sac on formal imaging today --No acute complaints or findings at this time --Discharge home in stable condition with first trimester precautions  F/U: --Patient returning to MAU Wednesday evening 05/16/2023 for repeat stat Quant hCG  Clayton Bibles, MSA, MSN, CNM Certified Nurse Midwife, [facility] for [employer], [facility]
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
## 2023-05-15 LAB — GC/CHLAMYDIA PROBE AMP (~~LOC~~) NOT AT ARMC
Chlamydia: NEGATIVE
Comment: NEGATIVE
Comment: NORMAL
Neisseria Gonorrhea: NEGATIVE

## 2023-05-16 ENCOUNTER — Inpatient Hospital Stay (HOSPITAL_COMMUNITY)
Admit: 2023-05-16 | Discharge: 2023-05-16 | Disposition: A | Discharge status: Home / Self Care | Payer: 59 | Attending: Obstetrics and Gynecology | Admitting: Obstetrics and Gynecology

## 2023-05-16 ENCOUNTER — Inpatient Hospital Stay (HOSPITAL_COMMUNITY)
Admission: AD | Admit: 2023-05-16 | Discharge: 2023-05-16 | Disposition: A | Discharge status: Home / Self Care | Payer: 59 | Attending: Obstetrics and Gynecology | Admitting: Obstetrics and Gynecology

## 2023-05-16 DIAGNOSIS — R109 Unspecified abdominal pain: Secondary | ICD-10-CM | POA: Diagnosis not present

## 2023-05-16 DIAGNOSIS — O3680X0 Pregnancy with inconclusive fetal viability, not applicable or unspecified: Secondary | ICD-10-CM | POA: Insufficient documentation

## 2023-05-16 DIAGNOSIS — O26899 Other specified pregnancy related conditions, unspecified trimester: Secondary | ICD-10-CM

## 2023-05-16 DIAGNOSIS — O26891 Other specified pregnancy related conditions, first trimester: Secondary | ICD-10-CM | POA: Diagnosis not present

## 2023-05-16 DIAGNOSIS — Z3A01 Less than 8 weeks gestation of pregnancy: Secondary | ICD-10-CM | POA: Diagnosis not present

## 2023-05-16 LAB — HCG, QUANTITATIVE, PREGNANCY: hCG, Beta Chain, Quant, S: 329 m[IU]/mL — ABNORMAL HIGH (ref <5)

## 2023-05-16 NOTE — MAU Note (Signed)
...  Ellen Huffman is a 22 y.o. at [redacted]w[redacted]d here in MAU reporting: Here for repeat beta hCG. She reports she is still experiencing intermittent lower abdominal cramping but reports it is not as intense. She rates is a 3/10 from a previous 6/10. Denies VB. Denies any other complaints.  hCG on 5/20 263 mIU/mL. US Impression: There is a gestational sac in the left side of the fundus of the uterus without fetal pole or cardiac activity. Differential diagnostic possibilities would include very early normal IUP or failed gestation with incomplete abortion or ectopic pregnancy with decidual reaction in the uterus. Serial HCG estimations and short-term follow-up study in 1-2 weeks may be considered.   1.8 cm cyst in the right adnexa may suggest dominant follicle or functional cyst.  Pain score: 3/10 lower abdomen    Lab orders placed from triage:  beta hCG per Sharen Counter, CNM

## 2023-05-16 NOTE — MAU Provider Note (Signed)
S: 22 y.o. [id number] @[redacted]w[redacted]d  by LMP presents to MAU for repeat hcg.  She reports mild cramping improved from her previous MAU visit. She denies bleeding.    Her quant hcg on 05/14/23 was 263 and ultrasound showed gestational sac only.  HPI  O: BP 137/77 (BP Location: Right Arm)   Pulse 84   Temp 98.8 F (37.1 C) (Oral)   Resp 16   Ht 5\' 6"  (1.676 m)   Wt 89.6 kg   LMP 04/07/2023 (Approximate)   SpO2 100%   BMI 31.89 kg/m   VS reviewed, nursing note reviewed,  Constitutional: well developed, well nourished, no distress HEENT: normocephalic CV: normal rate Pulm/chest wall: normal effort Abdomen: soft Neuro: alert and oriented x 3 Skin: warm, dry Psych: affect normal   Results for orders placed or performed during the hospital encounter of 05/16/23 (from the past 48 hour(s))  hCG, quantitative, pregnancy     Status: Abnormal   Collection Time: 05/16/23 12:53 PM  Result Value Ref Range   hCG, Beta Chain, Quant, S 329 (H) <5 mIU/mL    Comment:          GEST. AGE      CONC.  (mIU/mL)   <=1 WEEK        5 - 50     2 WEEKS       50 - 500     3 WEEKS       100 - 10,000     4 WEEKS     1,000 - 30,000     5 WEEKS     3,500 - 115,000   6-8 WEEKS     12,000 - 270,000    12 WEEKS     15,000 - 220,000        FEMALE AND NON-PREGNANT FEMALE:     LESS THAN 5 mIU/mL Performed at [facility], [address], [place], [place] [address]        MDM: Ordered labs/reviewed results.  Quant hcg rose at the lowest margin of appropriate.  Consult Dr Macon Large with assessment and findings.  Repeat hcg in 48 hours nonstat.  Bleeding/miscarriage precautions reviewed  Pt stable at time of discharge.  A:  1. Pregnancy with uncertain fetal viability, single or unspecified fetus   2. Abdominal pain affecting pregnancy   3. [redacted] weeks gestation of pregnancy     P: D/C home with ectopic/bleeding precautions F/U with outpatient ultrasound as ordered Return to MAU as needed for  emergencies  [place], [place], CNM 2:18 PM

## 2023-05-18 ENCOUNTER — Other Ambulatory Visit: Payer: 59

## 2023-05-18 ENCOUNTER — Other Ambulatory Visit: Payer: Self-pay

## 2023-05-18 ENCOUNTER — Other Ambulatory Visit: Payer: Self-pay | Admitting: General Practice

## 2023-05-18 DIAGNOSIS — O3680X0 Pregnancy with inconclusive fetal viability, not applicable or unspecified: Secondary | ICD-10-CM | POA: Diagnosis not present

## 2023-05-19 ENCOUNTER — Inpatient Hospital Stay (HOSPITAL_COMMUNITY): Payer: 59

## 2023-05-19 ENCOUNTER — Inpatient Hospital Stay (HOSPITAL_COMMUNITY)
Admission: AD | Admit: 2023-05-19 | Discharge: 2023-05-19 | Disposition: A | Discharge status: Home / Self Care | Payer: 59 | Attending: Obstetrics & Gynecology | Admitting: Obstetrics & Gynecology

## 2023-05-19 DIAGNOSIS — R109 Unspecified abdominal pain: Secondary | ICD-10-CM | POA: Insufficient documentation

## 2023-05-19 DIAGNOSIS — O26891 Other specified pregnancy related conditions, first trimester: Secondary | ICD-10-CM | POA: Diagnosis not present

## 2023-05-19 DIAGNOSIS — O3401 Maternal care for unspecified congenital malformation of uterus, first trimester: Secondary | ICD-10-CM | POA: Insufficient documentation

## 2023-05-19 DIAGNOSIS — Z3A01 Less than 8 weeks gestation of pregnancy: Secondary | ICD-10-CM | POA: Insufficient documentation

## 2023-05-19 DIAGNOSIS — O99891 Other specified diseases and conditions complicating pregnancy: Secondary | ICD-10-CM | POA: Diagnosis not present

## 2023-05-19 DIAGNOSIS — Z3A00 Weeks of gestation of pregnancy not specified: Secondary | ICD-10-CM | POA: Diagnosis not present

## 2023-05-19 DIAGNOSIS — Q5128 Other doubling of uterus, other specified: Secondary | ICD-10-CM | POA: Diagnosis not present

## 2023-05-19 LAB — BETA HCG QUANT (REF LAB): hCG Quant: 269 m[IU]/mL

## 2023-05-19 LAB — HCG, QUANTITATIVE, PREGNANCY: hCG, Beta Chain, Quant, S: 319 m[IU]/mL — ABNORMAL HIGH (ref <5)

## 2023-05-19 NOTE — MAU Provider Note (Signed)
[name] is a 22 y.o. [id number] pregnant female at [redacted]w[redacted]d who presents to MAU today after seeing her last results in MyChart and becoming concerned about her pregnancy viability. No bleeding or cramping today, doing well. No physical complaints.  Receives care at [facility]. Prenatal records reviewed.  Pertinent items noted in HPI and remainder of comprehensive ROS otherwise negative.   O BP 125/60 (BP Location: Right Arm)   Pulse 70   Temp 98.1 F (36.7 C) (Oral)   Resp 16   Ht 5\' 6"  (1.676 m)   Wt 198 lb 8 oz (90 kg)   LMP 04/07/2023 (Approximate)   SpO2 100%   BMI 32.04 kg/m  Physical Exam Vitals and nursing note reviewed.  Constitutional:      Appearance: Normal appearance.  HENT:     Head: Normocephalic.  Eyes:     Pupils: Pupils are equal, round, and reactive to light.  Cardiovascular:     Rate and Rhythm: Normal rate and regular rhythm.  Pulmonary:     Effort: Pulmonary effort is normal.  Abdominal:     Palpations: Abdomen is soft.     Tenderness: There is no abdominal tenderness.  Musculoskeletal:        General: Normal range of motion.  Skin:    General: Skin is warm and dry.     Capillary Refill: Capillary refill takes less than 2 seconds.  Neurological:     Mental Status: She is alert and oriented to person, place, and time.  Psychiatric:        Mood and Affect: Mood normal.        Behavior: Behavior normal.    Results for orders placed or performed during the hospital encounter of 05/19/23 (from the past 24 hour(s))  hCG, quantitative, pregnancy     Status: Abnormal   Collection Time: 05/19/23 11:32 AM  Result Value Ref Range   hCG, Beta Chain, Quant, S 319 (H) <5 mIU/mL  Previous HCG:  5/20 - 263 5/22 - 329 5/24 - 269  US OB Transvaginal  Result Date: 05/19/2023 CLINICAL DATA:  Abdominal pain during pregnancy in first trimester. Prior Ob ultrasound with gestational sac and left side of fundus of the uterus without fetal pole or cardiac  activity. EXAM: OBSTETRIC <14 WK [place] AND TRANSVAGINAL OB US TECHNIQUE: Both transabdominal and transvaginal ultrasound examinations were performed for complete evaluation of the gestation as well as the maternal uterus, adnexal regions, and pelvic cul-de-sac. Transvaginal technique was performed to assess early pregnancy. COMPARISON:  Early OB ultrasound 05/14/2023 FINDINGS: Intrauterine gestational sac: Single. Again visualized within the left portion of the uterine fundus. Yolk sac:  Visualized. Embryo:  Not Visualized. Cardiac Activity: Not applicable MSD: 4.8 mm   5 w   2 d Subchorionic hemorrhage:  None visualized. Maternal uterus/adnexae: There is a thick-walled structure with central anechoic lucency within the left ovary that may represent a corpus luteum. No associated color flow vascularity, and specifically no "ring of fire" at the periphery to suggest an ectopic pregnancy. Additionally, this appears to be WITHIN the left ovary and not adjacent to the left ovary. The uterus is again likely septate, with the septation measuring approximately 1.5 cm in craniocaudal length. There is again a simple cystic appearing structure measuring 1.8 x 1.5 cm adjacent to the right ovary which may represent a paraovarian cyst or paratubal cyst. IMPRESSION: There is again a gestational sac visualized within the left aspect of a septate uterine fundus. Again no fetal  pole or cardiac activity is seen. There may be a yolk sac newly visualized. Differential diagnostic possibilities again include a very early normal intrauterine pregnancy or failed pregnancy. An ectopic pregnancy still cannot be completely excluded given the absence of a fetal pole. Again recommend serial beta hCGs, close clinical follow-up, and possible ultrasound follow up in 1-2 weeks. This recommendation follows SRU consensus guidelines: Diagnostic Criteria for Nonviable Pregnancy Early in the First Trimester. Malva Limes Med 2013; 161:0960-45. Electronically  Signed   By: Neita Garnet M.D.   On: 05/19/2023 13:08    MDM: Moderate MAU Course: HCG drawn on arrival, given 5 days since last scan and hovering quants, also ordered repeat TVUS.  Results reviewed with Dr. Debroah Loop - although quants are suspicious for ectopic, imaging shows advancing pregnancy with new appearance of yolk sac. Since this is a highly desired pregnancy, will give strict ectopic precautions and have her follow up for a repeat ultrasound in one week. Future U/S order placed.   Patient given results and reassurance as well as strict ectopic precautions. Pt understanding.   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  Allergies as of 05/19/2023       Reactions   Penicillins Rash        Medication List     TAKE these medications    prenatal vitamin w/FE, FA 27-1 MG Tabs tablet Take 1 tablet by mouth daily at 12 noon.       Ellen Huffman, [place] 05/19/2023 9:59 PM

## 2023-05-19 NOTE — MAU Note (Signed)
.  Angelyn Wiltse is a 22 y.o. at [redacted]w[redacted]d here in MAU reporting: has been having HCG followed by MAU and [facility] over the past couple of days. She saw her HCG results on MyChart and was concerned about the levels being low, so she came into MAU.   Vaginal Bleeding Vag. Bleeding: None  Abnormal discharge/LOF: Membranes Amount: None and Amount: None  Fetal Movement: N/A  LMP: Patient's last menstrual period was 04/07/2023 (approximate). Pain score:  Pain Score: 0-No pain     Vitals:   05/19/23 1110  BP: 125/60  Pulse: 70  Resp: 16  Temp: 98.1 F (36.7 C)  SpO2: 100%      FHT:  N/A  OB Office: Faculty Lab orders placed from triage: N/A

## 2023-05-21 ENCOUNTER — Inpatient Hospital Stay (HOSPITAL_COMMUNITY)
Admission: AD | Admit: 2023-05-21 | Discharge: 2023-05-21 | Disposition: A | Discharge status: Home / Self Care | Payer: 59 | Attending: Obstetrics & Gynecology | Admitting: Obstetrics & Gynecology

## 2023-05-21 ENCOUNTER — Inpatient Hospital Stay (HOSPITAL_COMMUNITY): Payer: 59

## 2023-05-21 ENCOUNTER — Encounter (HOSPITAL_COMMUNITY): Payer: Self-pay | Admitting: Obstetrics & Gynecology

## 2023-05-21 DIAGNOSIS — O021 Missed abortion: Secondary | ICD-10-CM | POA: Diagnosis not present

## 2023-05-21 DIAGNOSIS — Z6790 Unspecified blood type, Rh positive: Secondary | ICD-10-CM | POA: Diagnosis not present

## 2023-05-21 DIAGNOSIS — Z3A01 Less than 8 weeks gestation of pregnancy: Secondary | ICD-10-CM

## 2023-05-21 DIAGNOSIS — O209 Hemorrhage in early pregnancy, unspecified: Secondary | ICD-10-CM | POA: Diagnosis not present

## 2023-05-21 DIAGNOSIS — Q5128 Other doubling of uterus, other specified: Secondary | ICD-10-CM | POA: Insufficient documentation

## 2023-05-21 LAB — CBC
HCT: 41.9 % (ref 36.0–46.0)
Hemoglobin: 13.8 g/dL (ref 12.0–15.0)
MCH: 30 pg (ref 26.0–34.0)
MCHC: 32.9 g/dL (ref 30.0–36.0)
MCV: 91.1 fL (ref 80.0–100.0)
Platelets: 265 10*3/uL (ref 150–400)
RBC: 4.6 MIL/uL (ref 3.87–5.11)
RDW: 13.2 % (ref 11.5–15.5)
WBC: 4.8 10*3/uL (ref 4.0–10.5)
nRBC: 0 % (ref 0.0–0.2)

## 2023-05-21 LAB — URINALYSIS, ROUTINE W REFLEX MICROSCOPIC
Bacteria, UA: NONE SEEN
Bilirubin Urine: NEGATIVE
Glucose, UA: NEGATIVE mg/dL
Ketones, ur: NEGATIVE mg/dL
Leukocytes,Ua: NEGATIVE
Nitrite: NEGATIVE
Protein, ur: NEGATIVE mg/dL
Specific Gravity, Urine: 1.025 (ref 1.005–1.030)
pH: 5 (ref 5.0–8.0)

## 2023-05-21 LAB — HCG, QUANTITATIVE, PREGNANCY: hCG, Beta Chain, Quant, S: 316 m[IU]/mL — ABNORMAL HIGH (ref <5)

## 2023-05-21 MED ORDER — MISOPROSTOL 200 MCG PO TABS: 800.0000 ug | ORAL_TABLET | Freq: Once | ORAL | 1 refills | Status: DC

## 2023-05-21 MED ORDER — IBUPROFEN 600 MG PO TABS: 600.0000 mg | ORAL_TABLET | Freq: Four times a day (QID) | ORAL | 0 refills | Status: DC | PRN

## 2023-05-21 MED ORDER — OXYCODONE HCL 5 MG PO TABS: 5.0000 mg | ORAL_TABLET | ORAL | 0 refills | Status: DC | PRN

## 2023-05-21 MED ORDER — PROMETHAZINE HCL 25 MG PO TABS: 12.5000 mg | ORAL_TABLET | Freq: Four times a day (QID) | ORAL | 0 refills | Status: DC | PRN

## 2023-05-21 NOTE — MAU Note (Signed)
.  Ellen Huffman is a 22 y.o. at [redacted]w[redacted]d here in MAU reporting: woke up this morning to use the restroom and saw bright red blood in the toilet, patient is currently wearing a pad but has not saturated in. Also reporting lower abdominal cramping that started last night.   Pain score: 5 Vitals:   05/21/23 0845  BP: 114/67  Pulse: 86  Resp: 14  Temp: 98.1 F (36.7 C)  SpO2: 100%     FHT:n/a Lab orders placed from triage:  UA

## 2023-05-21 NOTE — Discharge Instructions (Signed)
FACTS YOU SHOULD KNOW  WHAT IS AN EARLY PREGNANCY FAILURE? Once the egg is fertilized with the sperm and begins to develop, it attaches to the lining of the uterus. This early pregnancy tissue may not develop into an embryo (the beginning stage of a baby). Sometimes an embryo does develop but does not continue to grow. These problems can be seen on ultrasound.   MANAGEMNT OF EARLY PREGNANCY FAILURE: About 4 out of 100 (0.25%) women will have a pregnancy loss in her lifetime.  One in five pregnancies is found to be an early pregnancy failure.  There are 3 ways to care for an early pregnancy failure:   Surgery, (2) Medicine, (3) Waiting for you to pass the pregnancy on your own. The decision as to how to proceed after being diagnosed with and early pregnancy failure is an individual one.  The decision can be made only after appropriate counseling.  You need to weigh the pros and cons of the 3 choices. Then you can make the choice that works for you. SURGERY (D&E) Procedure over in 1 day Requires being put to sleep Bleeding may be light Possible problems during surgery, including injury to womb(uterus) Care provider has more control Medicine (CYTOTEC) The complete procedure may take days to weeks No Surgery Bleeding may be heavy at times There may be drug side effects Patient has more control Waiting You may choose to wait, in which case your own body may complete the passing of the abnormal early pregnancy on its own in about 2-4 weeks Your bleeding may be heavy at times There is a small possibility that you may need surgery if the bleeding is too much or not all of the pregnancy has passed. CYTOTEC MANAGEMENT Prostaglandins (cytotec) are the most widely used drug for this purpose. They cause the uterus to cramp and contract. You will place the medicine yourself inside your vagina in the privacy of your home. Empting of the uterus should occur within 3 days but the process may continue for  several weeks. The bleeding may seem heavy at times. POSSIBLE SIDE EFFECTS FROM CYTOTEC Nausea   Vomiting Diarrhea Fever Chills  Hot Flashes Side effects  from the process of the early pregnancy failure include: Cramping  Bleeding Headaches  Dizziness RISKS: This is a low risk procedure. Less than 1 in 100 women has a complication. An incomplete passage of the early pregnancy may occur. Also, Hemorrhage (heavy bleeding) could happen.  Rarely the pregnancy will not be passed completely. Excessively heavy bleeding may occur.  Your doctor may need to perform surgery to empty the uterus (D&E). Afterwards: Everybody will feel differently after the early pregnancy completion. You may have soreness or cramps for a day or two. You may have soreness or cramps for day or two.  You may have light bleeding for up to 2 weeks. You may be as active as you feel like being. If you have any of the following problems you may call Maternity Admissions Unit at [phone]. If you have pain that does not get better  with pain medication Bleeding that soaks through 2 thick full-sized sanitary pads in an hour Cramps that last longer than 2 days Foul smelling discharge Fever above 100.4 degrees F Even if you do not have any of these symptoms, you should have a follow-up exam to make sure you are healing properly. This appointment will be made for you before you leave the hospital. Your next normal period will start again in 4-6   week after the loss. You can get pregnant soon after the loss, so use birth control right away. Finally: Make sure all your questions are answered before during and after any procedure. Follow up with medical care and family planning methods.     

## 2023-05-21 NOTE — MAU Provider Note (Addendum)
History     CSN: [id number]  Arrival date and time: 05/21/23 4098   Event Date/Time   First Provider Initiated Contact with Patient 05/21/23 [id number]      Chief Complaint  Patient presents with   Abdominal Pain   Vaginal Bleeding   21 y.o. [id number] @[redacted]w[redacted]d  by LMP presenting with VB. Reports seeing bright red blood in the toilet this am. Unsure of amount, states it fell to the bottom. Denies cramping now but had some last night. No recent IC. Of note she was seen in MAU 2 days and had qhcg of 319 which was down from 329 three days prior and US showed IUGS w/YS in left aspect of a septate uterine fundus.     OB History     Gravida  3   Para      Term      Preterm      AB  2   Living         SAB  2   IAB      Ectopic      Multiple      Live Births              Past Medical History:  Diagnosis Date   ADHD (attention deficit hyperactivity disorder)    Allergy    Anxiety    Bipolar disorder (HCC)    Depression    Mental disorder    Overdose 12/25/2013   Seizures (HCC) 2022   drug withdrawl    Past Surgical History:  Procedure Laterality Date   DILATION AND CURETTAGE OF UTERUS N/A 09/27/2021   Procedure: DILATATION AND CURETTAGE WITH FROZEN WITH ULTRA SOUND;  Surgeon: [place] Bing, MD;  Location: [facility] OR;  Service: Gynecology;  Laterality: N/A;   DILATION AND EVACUATION N/A 12/27/2022   Procedure: DILATATION AND EVACUATION WITH ANORA GENETIC TESTING;  Surgeon: [place] Bing, MD;  Location: [facility] OR;  Service: Gynecology;  Laterality: N/A;    Family History  Problem Relation Age of Onset   Lupus Mother    Hyperlipidemia Father    Migraines Neg Hx    Parkinsonism Neg Hx    Seizures Neg Hx    ADD / ADHD Neg Hx    Bipolar disorder Neg Hx     Social History   Tobacco Use   Smoking status: Former    Types: Cigarettes    Passive exposure: Never   Smokeless tobacco: Never   Tobacco comments:    Quit Jan 2023    quit vaping with preg  Vaping Use    Vaping Use: Some days   Substances: Nicotine, Flavoring  Substance Use Topics   Alcohol use: Not Currently    Comment: drinks on the weekends   Drug use: Not Currently    Types: Marijuana    Comment: percocet, last use marijuana 04/2021    Allergies:  Allergies  Allergen Reactions   Penicillins Rash    Medications Prior to Admission  Medication Sig Dispense Refill Last Dose   prenatal vitamin w/FE, FA (PRENATAL 1 + 1) 27-1 MG TABS tablet Take 1 tablet by mouth daily at 12 noon. (Patient not taking: Reported on 12/22/2022) 90 tablet 3     Review of Systems  Gastrointestinal:  Negative for abdominal pain.  Genitourinary:  Positive for vaginal bleeding.   Physical Exam   Blood pressure 114/67, pulse 86, temperature 98.1 F (36.7 C), temperature source Oral, resp. rate 14, last menstrual period 04/07/2023, SpO2 100 %, unknown if  currently breastfeeding.  Physical Exam Vitals and nursing note reviewed. Exam conducted with a chaperone present.  Constitutional:      General: She is not in acute distress.    Appearance: Normal appearance.  HENT:     Head: Normocephalic and atraumatic.  Cardiovascular:     Rate and Rhythm: Normal rate.  Pulmonary:     Effort: Pulmonary effort is normal. No respiratory distress.  Abdominal:     Palpations: Abdomen is soft.     Tenderness: There is no abdominal tenderness. There is no guarding.  Genitourinary:    Comments: External: no lesions or erythema Speculum: vagina rugated, pink, moist, scant amt bloody discharge Bimanual:  uterus: non enlarged, non tender, no CMT Cervix closed  Musculoskeletal:        General: Normal range of motion.     Cervical back: Normal range of motion.  Skin:    General: Skin is warm and dry.  Neurological:     General: No focal deficit present.     Mental Status: She is alert and oriented to person, place, and time.  Psychiatric:        Mood and Affect: Mood normal.        Behavior: Behavior normal.     Results for orders placed or performed during the hospital encounter of 05/21/23 (from the past 24 hour(s))  Urinalysis, Routine w reflex microscopic -Urine, Clean Catch     Status: Abnormal   Collection Time: 05/21/23  8:36 AM  Result Value Ref Range   Color, Urine YELLOW YELLOW   APPearance HAZY (A) CLEAR   Specific Gravity, Urine 1.025 1.005 - 1.030   pH 5.0 5.0 - 8.0   Glucose, UA NEGATIVE NEGATIVE mg/dL   Hgb urine dipstick MODERATE (A) NEGATIVE   Bilirubin Urine NEGATIVE NEGATIVE   Ketones, ur NEGATIVE NEGATIVE mg/dL   Protein, ur NEGATIVE NEGATIVE mg/dL   Nitrite NEGATIVE NEGATIVE   Leukocytes,Ua NEGATIVE NEGATIVE   RBC / HPF 0-5 0 - 5 RBC/hpf   WBC, UA 0-5 0 - 5 WBC/hpf   Bacteria, UA NONE SEEN NONE SEEN   Squamous Epithelial / HPF 0-5 0 - 5 /HPF   Mucus PRESENT    Ca Oxalate Crys, UA PRESENT   CBC     Status: None   Collection Time: 05/21/23  9:08 AM  Result Value Ref Range   WBC 4.8 4.0 - 10.5 K/uL   RBC 4.60 3.87 - 5.11 MIL/uL   Hemoglobin 13.8 12.0 - 15.0 g/dL   HCT 16.1 09.6 - 04.5 %   MCV 91.1 80.0 - 100.0 fL   MCH 30.0 26.0 - 34.0 pg   MCHC 32.9 30.0 - 36.0 g/dL   RDW 40.9 81.1 - 91.4 %   Platelets 265 150 - 400 K/uL   nRBC 0.0 0.0 - 0.2 %  hCG, quantitative, pregnancy     Status: Abnormal   Collection Time: 05/21/23  9:08 AM  Result Value Ref Range   hCG, Beta Chain, Quant, S 316 (H) <5 mIU/mL    US OB Transvaginal  Result Date: 05/21/2023 CLINICAL DATA:  Vaginal bleeding. Estimated gestational age of [redacted] weeks, 2 days by LMP. EXAM: TRANSVAGINAL OB ULTRASOUND TECHNIQUE: Transvaginal ultrasound was performed for complete evaluation of the gestation as well as the maternal uterus, adnexal regions, and pelvic cul-de-sac. COMPARISON:  OB ultrasounds dated May 19, 2023, and May 14, 2023. FINDINGS: Intrauterine gestational sac: Single. Yolk sac:  Not Visualized. Embryo:  Not Visualized. MSD: 4.2 mm  5 w   1 d. Previously 4.8 mm two days ago. Subchorionic  hemorrhage:  None visualized. Maternal uterus/adnexae: Septate uterus again noted. Unchanged 1.7 cm simple cyst adjacent to the right, likely a paraovarian cyst. Left ovarian corpus luteum. No free fluid in the pelvis. IMPRESSION: 1. Findings are suspicious but not yet definitive for failed pregnancy. Recommend follow-up US in 7-10 days for definitive diagnosis. This recommendation follows SRU consensus guidelines: Diagnostic Criteria for Nonviable Pregnancy Early in the First Trimester. Malva Limes Med 2013; 782:9562-13. Electronically Signed   By: Obie Dredge M.D.   On: 05/21/2023 10:21    MAU Course  Procedures  MDM Labs and [place] ordered and reviewed. IUGS seen on [place] w/o YS  that was previously seen, and abnormal quant HCG over the last 7 days>263, 329, 319, 316 which indicates failed pregnancy. Discussed findings with pt, condolences given. Discussed options expectant vs medical mngt, she prefers medication.   Early Intrauterine Pregnancy Failure Protocol X  Documented intrauterine pregnancy failure less than or equal to [redacted] weeks gestation  X  No serious current illness  X  Baseline Hgb greater than or equal to 10g/dl  X  Patient has easily accessible transportation to the hospital  X  Clear preference  X  Practitioner/physician deems patient reliable  X  Counseling by practitioner or physician  X  Patient education by RN       Rho-Gam given by RN if indicated  X  Medication dispensed  X  Cytotec 800 mcg po X   Ibuprofen 600 mg 1 tablet by mouth every 6 hours as needed - prescribed  X   Oxycodone 1-2 tabs every 6 hours as needed - prescribed  X   Phenergan 12.5-25 mg by mouth every 6 hours as needed for nausea - prescribed Reviewed with pt cytotec procedure.  Pt verbalizes that she lives close to the hospital and has transportation readily available.  Pt appears reliable and verbalizes understanding and agrees with plan of care.   Assessment and Plan   1. Missed abortion   2. Blood  type, Rh positive    Discharge home Follow up at [facility] in 1 week- message sent Return precautions  Allergies as of 05/21/2023       Reactions   Penicillins Rash        Medication List     STOP taking these medications    prenatal vitamin w/FE, FA 27-1 MG Tabs tablet       TAKE these medications    ibuprofen 600 MG tablet Commonly known as: ADVIL Take 1 tablet (600 mg total) by mouth every 6 (six) hours as needed.   misoprostol 200 MCG tablet Commonly known as: Cytotec Take 4 tablets (800 mcg total) by mouth once for 1 dose. May repeat after 24 hrs if needed   oxyCODONE 5 MG immediate release tablet Commonly known as: Oxy IR/ROXICODONE Take 1 tablet (5 mg total) by mouth every 4 (four) hours as needed for severe pain or breakthrough pain.   promethazine 25 MG tablet Commonly known as: PHENERGAN Take 0.5-1 tablets (12.5-25 mg total) by mouth every 6 (six) hours as needed for nausea or vomiting.       Donette Larry, CNM 05/21/2023, 10:48 AM

## 2023-05-22 ENCOUNTER — Telehealth: Payer: Self-pay | Admitting: Certified Nurse Midwife

## 2023-05-22 NOTE — Telephone Encounter (Signed)
Spoke with patient to schedule for an appointment in a week. Patient confirmed appointment.

## 2023-05-22 NOTE — Telephone Encounter (Signed)
-----   Message from Donette Larry, [place] sent at 05/21/2023 10:52 AM EDT ----- Regarding: f/u appt Seen in MAU for MAB, needs f/u in 1 week with MD (also wants to discuss septate uterus). Thanks!

## 2023-05-23 ENCOUNTER — Telehealth: Payer: Self-pay | Admitting: *Deleted

## 2023-05-23 NOTE — Telephone Encounter (Signed)
Called to schedule [place] for one week and was told unable to schedule, call back tomorrow because appointments for next week not showing. Nancy Fetter

## 2023-05-23 NOTE — Telephone Encounter (Signed)
-----   Message from Bernerd Limbo, [place] sent at 05/19/2023 10:13 PM EDT ----- Regarding: Needs Follow Up U/S Please help this patient get her repeat ultrasound scheduled for late this week - she was seen in MAU on Saturday and needs a repeat in a week. Thank you! Harvin Hazel

## 2023-05-24 ENCOUNTER — Telehealth: Payer: Self-pay

## 2023-05-24 NOTE — Telephone Encounter (Signed)
Patient concern addressed in another encounter.    Alexus Galka,RN  

## 2023-05-24 NOTE — Telephone Encounter (Addendum)
-----   Message from Bernerd Limbo, [place] sent at 05/19/2023 10:13 PM EDT ----- Regarding: Needs Follow Up U/S Please help this patient get her repeat ultrasound scheduled for late this week - she was seen in MAU on Saturday and needs a repeat in a week. Thank you! Jamilla   Called pt and pt informs me that she has  miscarried and has taken Cytotec to help pass it.  Pt reports that she does not need the ultrasound.  I advised pt that it sounds like she does not need the U/S however I will f/u with provider and if she does I will call her back.  Pt verbalized understanding.   Leonette Nutting  05/24/23

## 2023-05-25 ENCOUNTER — Ambulatory Visit: Payer: Self-pay

## 2023-05-30 ENCOUNTER — Ambulatory Visit (INDEPENDENT_AMBULATORY_CARE_PROVIDER_SITE_OTHER): Payer: 59 | Admitting: Obstetrics & Gynecology

## 2023-05-30 ENCOUNTER — Other Ambulatory Visit: Payer: Self-pay

## 2023-05-30 VITALS — BP 117/78 | HR 75 | Ht 66.0 in | Wt 192.3 lb

## 2023-05-30 DIAGNOSIS — Q5128 Other doubling of uterus, other specified: Secondary | ICD-10-CM | POA: Diagnosis not present

## 2023-05-30 NOTE — Progress Notes (Signed)
Ultrasounds Results Note  SUBJECTIVE HPI:  [name] is a 22 y.o. G3P0020 at [redacted]w[redacted]d by LMP who presents to the [facility] for followup ultrasound results. The patient denies abdominal pain or vaginal bleeding.  Upon review of the patient's records, patient was first seen in MAU on 5/22 for cramping.   BHCG on that day was 329.  Ultrasound showed IUGS.  Last seen in MAU on 5/27. BHCG was 316.  No fetal pole on f/u US, picture was c/w failed pregnancy and she was given cytotec, and she completed the miscarriage  Past Medical History:  Diagnosis Date   ADHD (attention deficit hyperactivity disorder)    Allergy    Anxiety    Bipolar disorder (HCC)    Depression    Mental disorder    Overdose 12/25/2013   Seizures (HCC) 2022   drug withdrawl   Past Surgical History:  Procedure Laterality Date   DILATION AND CURETTAGE OF UTERUS N/A 09/27/2021   Procedure: DILATATION AND CURETTAGE WITH FROZEN WITH ULTRA SOUND;  Surgeon: [place] Bing, MD;  Location: [facility] OR;  Service: Gynecology;  Laterality: N/A;   DILATION AND EVACUATION N/A 12/27/2022   Procedure: DILATATION AND EVACUATION WITH ANORA GENETIC TESTING;  Surgeon: [place] Bing, MD;  Location: [facility] OR;  Service: Gynecology;  Laterality: N/A;   Social History   Socioeconomic History   Marital status: Single    Spouse name: Not on file   Number of children: Not on file   Years of education: Not on file   Highest education level: 12th grade  Occupational History   Not on file  Tobacco Use   Smoking status: Former    Types: Cigarettes    Passive exposure: Never   Smokeless tobacco: Never   Tobacco comments:    Quit Jan 2023    quit vaping with preg  Vaping Use   Vaping Use: Some days   Substances: Nicotine, Flavoring  Substance and Sexual Activity   Alcohol use: Not Currently    Comment: drinks on the weekends   Drug use: Not Currently    Types: Marijuana    Comment: percocet, last use marijuana 04/2021    Sexual activity: Yes    Birth control/protection: None  Other Topics Concern   Not on file  Social History Narrative   Grade:11   School Name:[employer]   How does patient do in school: average   Patient lives with: Dad- his girlfriend and her daughter- mom with her at visit   Does patient have and IEP/504 Plan in school? No      What are the patient's hobbies or interest? Sleep- no hobbies   Social Determinants of Health   Financial Resource Strain: Low Risk  (05/30/2023)   Overall Financial Resource Strain (CARDIA)    Difficulty of Paying Living Expenses: Not very hard  Food Insecurity: No Food Insecurity (05/30/2023)   Hunger Vital Sign    Worried About Running Out of Food in the Last Year: Never true    Ran Out of Food in the Last Year: Never true  Transportation Needs: No Transportation Needs (05/30/2023)   PRAPARE - Administrator, Civil Service (Medical): No    Lack of Transportation (Non-Medical): No  Physical Activity: Sufficiently Active (05/30/2023)   Exercise Vital Sign    Days of Exercise per Week: 5 days    Minutes of Exercise per Session: 60 min  Stress: No Stress Concern Present (05/30/2023)   [employer]  of Occupational Health - Occupational Stress Questionnaire    Feeling of Stress : Only a little  Social Connections: Socially Isolated (05/30/2023)   Social Connection and Isolation Panel [NHANES]    Frequency of Communication with Friends and Family: Three times a week    Frequency of Social Gatherings with Friends and Family: Three times a week    Attends Religious Services: Never    Active Member of Clubs or Organizations: No    Attends Engineer, structural: Not on file    Marital Status: Never married  Intimate Partner Violence: Not on file   Current Outpatient Medications on File Prior to Visit  Medication Sig Dispense Refill   ibuprofen (ADVIL) 600 MG tablet Take 1 tablet (600 mg total) by mouth every 6 (six) hours as needed. 30  tablet 0   oxyCODONE (OXY IR/ROXICODONE) 5 MG immediate release tablet Take 1 tablet (5 mg total) by mouth every 4 (four) hours as needed for severe pain or breakthrough pain. (Patient not taking: Reported on 05/30/2023) 6 tablet 0   promethazine (PHENERGAN) 25 MG tablet Take 0.5-1 tablets (12.5-25 mg total) by mouth every 6 (six) hours as needed for nausea or vomiting. (Patient not taking: Reported on 05/30/2023) 30 tablet 0   No current facility-administered medications on file prior to visit.   Allergies  Allergen Reactions   Penicillins Rash    I have reviewed patient's Past Medical Hx, Surgical Hx, Family Hx, Social Hx, medications and allergies.   Review of Systems Review of Systems  Constitutional: Negative for fever and chills.  Gastrointestinal: Negative for nausea, vomiting, abdominal pain, diarrhea and constipation.  Genitourinary: Negative for dysuria.  Musculoskeletal: Negative for back pain.  Neurological: Negative for dizziness and weakness.    Physical Exam  BP 117/78   Pulse 75   Ht 5\' 6"  (1.676 m)   Wt 192 lb 4.8 oz (87.2 kg)   LMP 04/07/2023 (Approximate)   Breastfeeding Unknown   BMI 31.04 kg/m   GENERAL: Well-developed, well-nourished female in no acute distress.  HEENT: Normocephalic, atraumatic.   LUNGS: Effort normal ABDOMEN: soft, non-tender HEART: Regular rate  SKIN: Warm, dry and without erythema PSYCH: Normal mood and affect NEURO: Alert and oriented x 4  LAB RESULTS No results found for this or any previous visit (from the past 24 hour(s)).  IMAGING US OB Transvaginal  Result Date: 05/21/2023 CLINICAL DATA:  Vaginal bleeding. Estimated gestational age of [redacted] weeks, 2 days by LMP. EXAM: TRANSVAGINAL OB ULTRASOUND TECHNIQUE: Transvaginal ultrasound was performed for complete evaluation of the gestation as well as the maternal uterus, adnexal regions, and pelvic cul-de-sac. COMPARISON:  OB ultrasounds dated May 19, 2023, and May 14, 2023. FINDINGS:  Intrauterine gestational sac: Single. Yolk sac:  Not Visualized. Embryo:  Not Visualized. MSD: 4.2 mm   5 w   1 d. Previously 4.8 mm two days ago. Subchorionic hemorrhage:  None visualized. Maternal uterus/adnexae: Septate uterus again noted. Unchanged 1.7 cm simple cyst adjacent to the right, likely a paraovarian cyst. Left ovarian corpus luteum. No free fluid in the pelvis. IMPRESSION: 1. Findings are suspicious but not yet definitive for failed pregnancy. Recommend follow-up US in 7-10 days for definitive diagnosis. This recommendation follows SRU consensus guidelines: Diagnostic Criteria for Nonviable Pregnancy Early in the First Trimester. Malva Limes Med 2013; 213:0865-78. Electronically Signed   By: Obie Dredge M.D.   On: 05/21/2023 10:21   US OB Transvaginal  Result Date: 05/19/2023 CLINICAL DATA:  Abdominal pain  during pregnancy in first trimester. Prior Ob ultrasound with gestational sac and left side of fundus of the uterus without fetal pole or cardiac activity. EXAM: OBSTETRIC <14 WK [place] AND TRANSVAGINAL OB US TECHNIQUE: Both transabdominal and transvaginal ultrasound examinations were performed for complete evaluation of the gestation as well as the maternal uterus, adnexal regions, and pelvic cul-de-sac. Transvaginal technique was performed to assess early pregnancy. COMPARISON:  Early OB ultrasound 05/14/2023 FINDINGS: Intrauterine gestational sac: Single. Again visualized within the left portion of the uterine fundus. Yolk sac:  Visualized. Embryo:  Not Visualized. Cardiac Activity: Not applicable MSD: 4.8 mm   5 w   2 d Subchorionic hemorrhage:  None visualized. Maternal uterus/adnexae: There is a thick-walled structure with central anechoic lucency within the left ovary that may represent a corpus luteum. No associated color flow vascularity, and specifically no "ring of fire" at the periphery to suggest an ectopic pregnancy. Additionally, this appears to be WITHIN the left ovary and not  adjacent to the left ovary. The uterus is again likely septate, with the septation measuring approximately 1.5 cm in craniocaudal length. There is again a simple cystic appearing structure measuring 1.8 x 1.5 cm adjacent to the right ovary which may represent a paraovarian cyst or paratubal cyst. IMPRESSION: There is again a gestational sac visualized within the left aspect of a septate uterine fundus. Again no fetal pole or cardiac activity is seen. There may be a yolk sac newly visualized. Differential diagnostic possibilities again include a very early normal intrauterine pregnancy or failed pregnancy. An ectopic pregnancy still cannot be completely excluded given the absence of a fetal pole. Again recommend serial beta hCGs, close clinical follow-up, and possible ultrasound follow up in 1-2 weeks. This recommendation follows SRU consensus guidelines: Diagnostic Criteria for Nonviable Pregnancy Early in the First Trimester. Malva Limes Med 2013; 161:0960-45. Electronically Signed   By: Neita Garnet M.D.   On: 05/19/2023 13:08   US OB LESS THAN 14 WEEKS WITH OB TRANSVAGINAL  Result Date: 05/14/2023 CLINICAL DATA:  Pelvic pain EXAM: OBSTETRIC <14 WK [place] AND TRANSVAGINAL OB US TECHNIQUE: Both transabdominal and transvaginal ultrasound examinations were performed for complete evaluation of the gestation as well as the maternal uterus, adnexal regions, and pelvic cul-de-sac. Transvaginal technique was performed to assess early pregnancy. COMPARISON:  None Available. FINDINGS: Intrauterine gestational sac: Single. Endometrial stripe within the fundus isn't evaluated suggesting bicornuate or septate uterus. Yolk sac:  Not seen Embryo:  Not seen Cardiac Activity: Not seen Subchorionic hemorrhage:  None visualized. Maternal uterus/adnexae: There is a 1.8 x 1.4 cm simple appearing cyst in the margin of the right ovary. There is no free fluid in pelvis. IMPRESSION: There is a gestational sac in the left side of the fundus  of the uterus without fetal pole or cardiac activity. Differential diagnostic possibilities would include very early normal IUP or failed gestation with incomplete abortion or ectopic pregnancy with decidual reaction in the uterus. Serial HCG estimations and short-term follow-up study in 1-2 weeks may be considered. 1.8 cm cyst in the right adnexa may suggest dominant follicle or functional cyst. Electronically Signed   By: Ernie Avena M.D.   On: 05/14/2023 13:06    ASSESSMENT 1. Uterine septum    S/p SAb, recurrent PLAN Discharge home in stable condition Referred to Dr. April Manson due to recurrent pregnancy loss and intrauterine septum  Adam Phenix, MD  05/30/2023  11:36 AM

## 2023-06-07 ENCOUNTER — Encounter: Payer: Self-pay | Admitting: Obstetrics and Gynecology

## 2023-06-22 ENCOUNTER — Ambulatory Visit: Payer: 59 | Admitting: Family

## 2024-02-01 ENCOUNTER — Inpatient Hospital Stay (HOSPITAL_COMMUNITY)
Admission: AD | Admit: 2024-02-01 | Discharge: 2024-02-01 | Disposition: A | Discharge status: Home / Self Care | Payer: Medicaid Other | Attending: Obstetrics and Gynecology | Admitting: Obstetrics and Gynecology

## 2024-02-01 ENCOUNTER — Inpatient Hospital Stay (HOSPITAL_COMMUNITY): Payer: Medicaid Other

## 2024-02-01 ENCOUNTER — Encounter (HOSPITAL_COMMUNITY): Payer: Self-pay | Admitting: Obstetrics and Gynecology

## 2024-02-01 DIAGNOSIS — O3401 Maternal care for unspecified congenital malformation of uterus, first trimester: Secondary | ICD-10-CM | POA: Diagnosis not present

## 2024-02-01 DIAGNOSIS — Z3A01 Less than 8 weeks gestation of pregnancy: Secondary | ICD-10-CM | POA: Insufficient documentation

## 2024-02-01 DIAGNOSIS — O26891 Other specified pregnancy related conditions, first trimester: Secondary | ICD-10-CM | POA: Diagnosis not present

## 2024-02-01 DIAGNOSIS — R109 Unspecified abdominal pain: Secondary | ICD-10-CM | POA: Diagnosis present

## 2024-02-01 DIAGNOSIS — Q513 Bicornate uterus: Secondary | ICD-10-CM | POA: Insufficient documentation

## 2024-02-01 LAB — URINALYSIS, ROUTINE W REFLEX MICROSCOPIC
Bilirubin Urine: NEGATIVE
Glucose, UA: NEGATIVE mg/dL
Hgb urine dipstick: NEGATIVE
Ketones, ur: 20 mg/dL — AB
Leukocytes,Ua: NEGATIVE
Nitrite: NEGATIVE
Protein, ur: NEGATIVE mg/dL
Specific Gravity, Urine: 1.011 (ref 1.005–1.030)
pH: 5 (ref 5.0–8.0)

## 2024-02-01 LAB — CBC
HCT: 37.2 % (ref 36.0–46.0)
Hemoglobin: 13 g/dL (ref 12.0–15.0)
MCH: 31.6 pg (ref 26.0–34.0)
MCHC: 34.9 g/dL (ref 30.0–36.0)
MCV: 90.5 fL (ref 80.0–100.0)
Platelets: 149 10*3/uL — ABNORMAL LOW (ref 150–400)
RBC: 4.11 MIL/uL (ref 3.87–5.11)
RDW: 15.1 % (ref 11.5–15.5)
WBC: 5.4 10*3/uL (ref 4.0–10.5)
nRBC: 0 % (ref 0.0–0.2)

## 2024-02-01 LAB — WET PREP, GENITAL
Clue Cells Wet Prep HPF POC: NONE SEEN
Sperm: NONE SEEN
Trich, Wet Prep: NONE SEEN
WBC, Wet Prep HPF POC: <10 (ref <10)
Yeast Wet Prep HPF POC: NONE SEEN

## 2024-02-01 LAB — POCT PREGNANCY, URINE: Preg Test, Ur: POSITIVE — AB

## 2024-02-01 LAB — HCG, QUANTITATIVE, PREGNANCY: hCG, Beta Chain, Quant, S: 561 m[IU]/mL — ABNORMAL HIGH (ref <5)

## 2024-02-01 NOTE — MAU Provider Note (Signed)
 History     CSN: [id number]  Arrival date and time: 02/01/24 8144   Event Date/Time   First Provider Initiated Contact with Patient 02/01/24 2156      Chief Complaint  Patient presents with   Abdominal Pain    Ellen Huffman is a 23 y.o. G4P0030 at [redacted]w[redacted]d by estimated LMP of Dec 15, 2023.  She has received previous care at [facility].  She presents today for abdominal cramping.  She states the cramping started 2 weeks ago around time when period was suppose to start. She states the cramping is constant and is located in lower abdomen. She rates the pain a constant 4, but when it gets bad it gets to a 8 or 9.  She states it is worse with getting stressed out and improved with heat and laying on back.   OB History     Gravida  4   Para      Term      Preterm      AB  3   Living         SAB  3   IAB      Ectopic      Multiple      Live Births              Past Medical History:  Diagnosis Date   ADHD (attention deficit hyperactivity disorder)    Allergy    Anxiety    Bipolar disorder (HCC)    Depression    Mental disorder    Overdose 12/25/2013   Seizures (HCC) 2022   drug withdrawl    Past Surgical History:  Procedure Laterality Date   DILATION AND CURETTAGE OF UTERUS N/A 09/27/2021   Procedure: DILATATION AND CURETTAGE WITH FROZEN WITH ULTRA SOUND;  Surgeon: Izell Harari, MD;  Location: [facility] OR;  Service: Gynecology;  Laterality: N/A;   DILATION AND EVACUATION N/A 12/27/2022   Procedure: DILATATION AND EVACUATION WITH ANORA GENETIC TESTING;  Surgeon: Izell Harari, MD;  Location: [facility] OR;  Service: Gynecology;  Laterality: N/A;    Family History  Problem Relation Age of Onset   Lupus Mother    Hyperlipidemia Father    Migraines Neg Hx    Parkinsonism Neg Hx    Seizures Neg Hx    ADD / ADHD Neg Hx    Bipolar disorder Neg Hx     Social History   Tobacco Use   Smoking status: Former    Types: Cigarettes    Passive exposure: Never    Smokeless tobacco: Never   Tobacco comments:    Quit Jan 2023    quit vaping with preg  Vaping Use   Vaping status: Some Days   Substances: Nicotine , Flavoring  Substance Use Topics   Alcohol use: Not Currently    Comment: drinks on the weekends   Drug use: Not Currently    Types: Marijuana    Comment: percocet, last use marijuana 04/2021    Allergies:  Allergies  Allergen Reactions   Penicillins Rash    Medications Prior to Admission  Medication Sig Dispense Refill Last Dose/Taking   ibuprofen  (ADVIL ) 600 MG tablet Take 1 tablet (600 mg total) by mouth every 6 (six) hours as needed. 30 tablet 0    oxyCODONE  (OXY IR/ROXICODONE ) 5 MG immediate release tablet Take 1 tablet (5 mg total) by mouth every 4 (four) hours as needed for severe pain or breakthrough pain. (Patient not taking: Reported on 05/30/2023) 6 tablet 0  promethazine  (PHENERGAN ) 25 MG tablet Take 0.5-1 tablets (12.5-25 mg total) by mouth every 6 (six) hours as needed for nausea or vomiting. (Patient not taking: Reported on 05/30/2023) 30 tablet 0     Review of Systems  Gastrointestinal:  Positive for abdominal pain (Cramping) and constipation. Negative for diarrhea, nausea and vomiting.  Genitourinary:  Negative for difficulty urinating, dysuria, vaginal bleeding and vaginal discharge.   Physical Exam   Blood pressure 134/78, pulse (!) 106, temperature 98.5 F (36.9 C), temperature source Oral, resp. rate 16, height 5' 6 (1.676 m), weight 90.8 kg, last menstrual period 12/15/2023, unknown if currently breastfeeding.  Physical Exam Vitals reviewed.  Constitutional:      General: She is not in acute distress.    Appearance: Normal appearance. She is well-developed.  HENT:     Head: Normocephalic and atraumatic.  Eyes:     Conjunctiva/sclera: Conjunctivae normal.  Cardiovascular:     Rate and Rhythm: Normal rate.  Pulmonary:     Effort: Pulmonary effort is normal. No respiratory distress.  Musculoskeletal:         General: Normal range of motion.     Cervical back: Normal range of motion.  Skin:    General: Skin is warm and dry.  Neurological:     Mental Status: She is alert and oriented to person, place, and time.  Psychiatric:        Mood and Affect: Mood normal.        Behavior: Behavior normal.     MAU Course  Procedures Results for orders placed or performed during the hospital encounter of 02/01/24 (from the past 24 hours)  Wet prep, genital     Status: None   Collection Time: 02/01/24  7:35 PM  Result Value Ref Range   Yeast Wet Prep HPF POC NONE SEEN NONE SEEN   Trich, Wet Prep NONE SEEN NONE SEEN   Clue Cells Wet Prep HPF POC NONE SEEN NONE SEEN   WBC, Wet Prep HPF POC <10 <10   Sperm NONE SEEN   Pregnancy, urine POC     Status: Abnormal   Collection Time: 02/01/24  7:36 PM  Result Value Ref Range   Preg Test, Ur POSITIVE (A) NEGATIVE  Urinalysis, Routine w reflex microscopic -Urine, Clean Catch     Status: Abnormal   Collection Time: 02/01/24  7:40 PM  Result Value Ref Range   Color, Urine YELLOW YELLOW   APPearance CLEAR CLEAR   Specific Gravity, Urine 1.011 1.005 - 1.030   pH 5.0 5.0 - 8.0   Glucose, UA NEGATIVE NEGATIVE mg/dL   Hgb urine dipstick NEGATIVE NEGATIVE   Bilirubin Urine NEGATIVE NEGATIVE   Ketones, ur 20 (A) NEGATIVE mg/dL   Protein, ur NEGATIVE NEGATIVE mg/dL   Nitrite NEGATIVE NEGATIVE   Leukocytes,Ua NEGATIVE NEGATIVE  CBC     Status: Abnormal   Collection Time: 02/01/24  8:55 PM  Result Value Ref Range   WBC 5.4 4.0 - 10.5 K/uL   RBC 4.11 3.87 - 5.11 MIL/uL   Hemoglobin 13.0 12.0 - 15.0 g/dL   HCT 62.7 63.9 - 53.9 %   MCV 90.5 80.0 - 100.0 fL   MCH 31.6 26.0 - 34.0 pg   MCHC 34.9 30.0 - 36.0 g/dL   RDW 84.8 88.4 - 84.4 %   Platelets 149 (L) 150 - 400 K/uL   nRBC 0.0 0.0 - 0.2 %  hCG, quantitative, pregnancy     Status: Abnormal   Collection Time: 02/01/24  8:55 PM  Result Value Ref Range   hCG, Beta Chain, Quant, S 561 (H) <5 mIU/mL    US  OB LESS THAN 14 WEEKS WITH OB TRANSVAGINAL Result Date: 02/01/2024 CLINICAL DATA:  Abdominal pain, pregnant EXAM: OBSTETRIC <14 WK ULTRASOUND TECHNIQUE: Transabdominal ultrasound was performed for evaluation of the gestation as well as the maternal uterus and adnexal regions. COMPARISON:  None Available. 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 Yolk sac:  Not Visualized. Embryo:  Not Visualized. Cardiac Activity: Not Visualized. Heart Rate: Not visualized MSD:  2.1 mm   4 w   6  d Subchorionic hemorrhage:  None visualized. Maternal uterus/adnexae: Bicornuate uterus. Functional right ovarian cyst measuring 3.8 cm, benign, requiring no further follow-up or characterization. 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 No fetal pole or fetal cardiac activity identified. Early candidate pregnancy of uncertain viability. Consider serial beta hCG and follow-up ultrasound in 7-14 days to assess for continued development and viability. Electronically Signed   By: Marolyn JONETTA Jaksch M.D.   On: 02/01/2024 21:04    MDM Physical Exam Cultures: Wet Prep and GC/CT Labs: UA, UPT, CBC, hCG Ultrasound Coordination of Follow Up Assessment and Plan  23 year old  G4P0 at 6.6 weeks by E.LMP Abdominal Cramping  -Orders placed by previous provider while patient in triage. -Results as above. -Provider to bedside to discuss. -Informed of need for repeat labs in 48 hours. -Patient given option of returning Sunday evening or going to office Monday morning. -Patient states she does not have transportation and would prefer to return on Sunday as she can not miss work. -Scheduled for MAU visit on 2/9 in the evening. -Precautions reviewed. -Discharged to home in stable condition.  Harlene LITTIE Duncans MSN, CNM Advanced Practice Provider, [facility] 02/01/2024, 9:56 PM

## 2024-02-01 NOTE — MAU Note (Signed)
 Pt says she did HPT- faint and then bolder.  No BC Feels cramping started 2 weeks ago 4/10 Cramps now 5/10. No meds for cramps- uses heating pad. No VB

## 2024-02-03 ENCOUNTER — Encounter (HOSPITAL_COMMUNITY): Payer: Self-pay | Admitting: Obstetrics and Gynecology

## 2024-02-03 ENCOUNTER — Inpatient Hospital Stay (HOSPITAL_COMMUNITY)
Admit: 2024-02-03 | Discharge: 2024-02-03 | Discharge status: Home / Self Care | Payer: Medicaid Other | Attending: Obstetrics and Gynecology

## 2024-02-03 ENCOUNTER — Inpatient Hospital Stay (HOSPITAL_COMMUNITY)
Admission: AD | Admit: 2024-02-03 | Discharge: 2024-02-03 | Disposition: A | Discharge status: Home / Self Care | Payer: Self-pay | Attending: Obstetrics and Gynecology | Admitting: Obstetrics and Gynecology

## 2024-02-03 DIAGNOSIS — O26899 Other specified pregnancy related conditions, unspecified trimester: Secondary | ICD-10-CM

## 2024-02-03 DIAGNOSIS — R1030 Lower abdominal pain, unspecified: Secondary | ICD-10-CM | POA: Diagnosis not present

## 2024-02-03 DIAGNOSIS — Z3A01 Less than 8 weeks gestation of pregnancy: Secondary | ICD-10-CM | POA: Insufficient documentation

## 2024-02-03 DIAGNOSIS — R109 Unspecified abdominal pain: Secondary | ICD-10-CM

## 2024-02-03 DIAGNOSIS — O26891 Other specified pregnancy related conditions, first trimester: Secondary | ICD-10-CM | POA: Insufficient documentation

## 2024-02-03 LAB — HCG, QUANTITATIVE, PREGNANCY: hCG, Beta Chain, Quant, S: 1133 m[IU]/mL — ABNORMAL HIGH (ref <5)

## 2024-02-03 NOTE — MAU Provider Note (Signed)
 Chief Complaint: Follow-up  SUBJECTIVE HPI: Ellen Huffman is a 23 y.o. G4P0030 at [redacted]w[redacted]d by LMP who presents to maternity admissions reporting need for repeat HCG.  Patient presented to MAU 2/7 for lower abdominal cramping. Has a history of recurrent losses and septate uterus. She has rare cramping in lower abdomen still. Has not worsened. Has not developed any bleeding, LOF, fever/chills.  HPI  Past Medical History:  Diagnosis Date   ADHD (attention deficit hyperactivity disorder)    Allergy    Anxiety    Bipolar disorder (HCC)    Depression    Mental disorder    Overdose 12/25/2013   Seizures (HCC) 2022   drug withdrawl   Past Surgical History:  Procedure Laterality Date   DILATION AND CURETTAGE OF UTERUS N/A 09/27/2021   Procedure: DILATATION AND CURETTAGE WITH FROZEN WITH ULTRA SOUND;  Surgeon: Izell Harari, MD;  Location: [facility];  Service: Gynecology;  Laterality: N/A;   DILATION AND EVACUATION N/A 12/27/2022   Procedure: DILATATION AND EVACUATION WITH ANORA GENETIC TESTING;  Surgeon: Izell Harari, MD;  Location: [facility] OR;  Service: Gynecology;  Laterality: N/A;   Social History   Socioeconomic History   Marital status: Single    Spouse name: Not on file   Number of children: Not on file   Years of education: Not on file   Highest education level: 12th grade  Occupational History   Not on file  Tobacco Use   Smoking status: Former    Types: Cigarettes    Passive exposure: Never   Smokeless tobacco: Never   Tobacco comments:    Quit Jan 2023    quit vaping with preg  Vaping Use   Vaping status: Some Days   Substances: Nicotine , Flavoring  Substance and Sexual Activity   Alcohol use: Not Currently    Comment: drinks on the weekends   Drug use: Not Currently    Types: Marijuana    Comment: percocet, last use marijuana 04/2021   Sexual activity: Yes    Birth control/protection: None  Other Topics Concern   Not on file  Social History Narrative   Grade:11    School Name:[employer]   How does patient do in school: average   Patient lives with: Dad- his girlfriend and her daughter- mom with her at visit   Does patient have and IEP/504 Plan in school? No      What are the patient's hobbies or interest? Sleep- no hobbies   Social Drivers of Corporate Investment Banker Strain: Low Risk  (05/30/2023)   Overall Financial Resource Strain (CARDIA)    Difficulty of Paying Living Expenses: Not very hard  Food Insecurity: No Food Insecurity (05/30/2023)   Hunger Vital Sign    Worried About Running Out of Food in the Last Year: Never true    Ran Out of Food in the Last Year: Never true  Transportation Needs: No Transportation Needs (05/30/2023)   PRAPARE - Administrator, Civil Service (Medical): No    Lack of Transportation (Non-Medical): No  Physical Activity: Sufficiently Active (05/30/2023)   Exercise Vital Sign    Days of Exercise per Week: 5 days    Minutes of Exercise per Session: 60 min  Stress: No Stress Concern Present (05/30/2023)   [employer] of Occupational Health - Occupational Stress Questionnaire    Feeling of Stress : Only a little  Social Connections: Socially Isolated (05/30/2023)   Social Connection and Isolation Panel [NHANES]  Frequency of Communication with Friends and Family: Three times a week    Frequency of Social Gatherings with Friends and Family: Three times a week    Attends Religious Services: Never    Active Member of Clubs or Organizations: No    Attends Engineer, Structural: Not on file    Marital Status: Never married  Intimate Partner Violence: Not on file   No current facility-administered medications on file prior to encounter.   Current Outpatient Medications on File Prior to Encounter  Medication Sig Dispense Refill   ibuprofen  (ADVIL ) 600 MG tablet Take 1 tablet (600 mg total) by mouth every 6 (six) hours as needed. 30 tablet 0   oxyCODONE  (OXY IR/ROXICODONE ) 5 MG immediate  release tablet Take 1 tablet (5 mg total) by mouth every 4 (four) hours as needed for severe pain or breakthrough pain. (Patient not taking: Reported on 05/30/2023) 6 tablet 0   promethazine  (PHENERGAN ) 25 MG tablet Take 0.5-1 tablets (12.5-25 mg total) by mouth every 6 (six) hours as needed for nausea or vomiting. (Patient not taking: Reported on 05/30/2023) 30 tablet 0   Allergies  Allergen Reactions   Penicillins Rash    ROS:  Pertinent positives/negatives listed above.  I have reviewed patient's Past Medical Hx, Surgical Hx, Family Hx, Social Hx, medications and allergies.   Physical Exam  Patient Vitals for the past 24 hrs:  BP Temp Pulse Resp  02/03/24 1924 137/77 98.1 F (36.7 C) 91 18   Constitutional: Well-developed, well-nourished female in no acute distress Cardiovascular: normal rate Respiratory: normal effort GI: Abd soft, non-tender. Pos BS x 4 MS: Extremities nontender, no edema, normal ROM Neurologic: Alert and oriented x 4 GU: Neg CVAT  LAB RESULTS No results found for this or any previous visit (from the past 24 hours).     IMAGING US  OB LESS THAN 14 WEEKS WITH OB TRANSVAGINAL Result Date: 02/01/2024 CLINICAL DATA:  Abdominal pain, pregnant EXAM: OBSTETRIC <14 WK ULTRASOUND TECHNIQUE: Transabdominal ultrasound was performed for evaluation of the gestation as well as the maternal uterus and adnexal regions. COMPARISON:  None Available. 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 Yolk sac:  Not Visualized. Embryo:  Not Visualized. Cardiac Activity: Not Visualized. Heart Rate: Not visualized MSD:  2.1 mm   4 w   6  d Subchorionic hemorrhage:  None visualized. Maternal uterus/adnexae: Bicornuate uterus. Functional right ovarian cyst measuring 3.8 cm, benign, requiring no further follow-up or characterization. 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 No fetal pole or fetal cardiac activity identified. Early candidate pregnancy of uncertain viability. Consider serial beta hCG and follow-up ultrasound in 7-14 days to assess for continued development and viability. Electronically Signed   By: Marolyn JONETTA Jaksch M.D.   On: 02/01/2024 21:04    MAU Management/MDM: Orders Placed This Encounter  Procedures   hCG, quantitative, pregnancy   Discharge patient    No orders of the defined types were placed in this encounter.   Patient seen for repeat HCG in setting of unknown viability and abdominal cramping. Is HDS without development of bleeding. Repeat HCG drawn. Patient prefers to wait at home for results. Will be sent via MyChart per patient request. Will need viability scan in 7-14 days if quant rising.  ASSESSMENT 1. Abdominal cramping affecting pregnancy   2. [redacted] weeks gestation of pregnancy  PLAN Discharge home with strict return precautions. Allergies as of 02/03/2024       Reactions   Penicillins Rash        Medication List     STOP taking these medications    ibuprofen  600 MG tablet Commonly known as: ADVIL    oxyCODONE  5 MG immediate release tablet Commonly known as: Oxy IR/ROXICODONE    promethazine  25 MG tablet Commonly known as: PHENERGAN          Almarie Moats, MD OB Fellow 02/03/2024  7:47 PM

## 2024-02-03 NOTE — MAU Note (Signed)
 Pt here for f/u HCG level. Stated had a little bit of cramping earlier today abut none now. No bleeding.BP 137/77   Pulse 91   Temp 98.1 F (36.7 C)   Resp 18   LMP 12/15/2023

## 2024-02-04 LAB — GC/CHLAMYDIA PROBE AMP (~~LOC~~) NOT AT ARMC
Chlamydia: NEGATIVE
Comment: NEGATIVE
Comment: NORMAL
Neisseria Gonorrhea: NEGATIVE

## 2024-02-17 ENCOUNTER — Other Ambulatory Visit: Payer: Self-pay

## 2024-02-17 ENCOUNTER — Other Ambulatory Visit: Payer: Self-pay | Admitting: Certified Nurse Midwife

## 2024-02-17 ENCOUNTER — Inpatient Hospital Stay (HOSPITAL_COMMUNITY)
Admission: AD | Admit: 2024-02-17 | Discharge: 2024-02-17 | Disposition: A | Discharge status: Home / Self Care | Payer: Medicaid Other | Attending: Obstetrics and Gynecology | Admitting: Obstetrics and Gynecology

## 2024-02-17 DIAGNOSIS — Z711 Person with feared health complaint in whom no diagnosis is made: Secondary | ICD-10-CM | POA: Insufficient documentation

## 2024-02-17 DIAGNOSIS — Q513 Bicornate uterus: Secondary | ICD-10-CM

## 2024-02-17 DIAGNOSIS — O26891 Other specified pregnancy related conditions, first trimester: Secondary | ICD-10-CM | POA: Diagnosis not present

## 2024-02-17 DIAGNOSIS — R1030 Lower abdominal pain, unspecified: Secondary | ICD-10-CM | POA: Insufficient documentation

## 2024-02-17 DIAGNOSIS — F17290 Nicotine dependence, other tobacco product, uncomplicated: Secondary | ICD-10-CM | POA: Insufficient documentation

## 2024-02-17 DIAGNOSIS — O99331 Smoking (tobacco) complicating pregnancy, first trimester: Secondary | ICD-10-CM | POA: Insufficient documentation

## 2024-02-17 DIAGNOSIS — R109 Unspecified abdominal pain: Secondary | ICD-10-CM

## 2024-02-17 DIAGNOSIS — Z3A09 9 weeks gestation of pregnancy: Secondary | ICD-10-CM | POA: Insufficient documentation

## 2024-02-17 DIAGNOSIS — Z3490 Encounter for supervision of normal pregnancy, unspecified, unspecified trimester: Secondary | ICD-10-CM

## 2024-02-17 DIAGNOSIS — O26899 Other specified pregnancy related conditions, unspecified trimester: Secondary | ICD-10-CM

## 2024-02-17 MED ORDER — CYCLOBENZAPRINE HCL 5 MG PO TABS
10.0000 mg | ORAL_TABLET | Freq: Once | ORAL | Status: AC
  Administered 2024-02-17: 10 mg via ORAL
  Filled 2024-02-17: qty 2

## 2024-02-17 MED ORDER — ACETAMINOPHEN 500 MG PO TABS
1000.0000 mg | ORAL_TABLET | Freq: Once | ORAL | Status: AC
  Administered 2024-02-17: 1000 mg via ORAL
  Filled 2024-02-17: qty 2

## 2024-02-17 NOTE — MAU Provider Note (Signed)
 History     CSN: [id number]  Arrival date and time: 02/17/24 1144   None     Chief Complaint  Patient presents with   Abdominal Pain   Ellen Huffman , a  23 y.o. G4P0030 at [redacted]w[redacted]d presents to MAU with complaints of on-going cramping. Patient was recently seen in MAU with similar complaints. She states that this morning prior to arrival the pain had worsened to a 9/10. Currently rating a 7-8/10 and continuously improving.  She reports intermittent lower abdominal cramping, that has no worsening or alleviating symptoms. She denies attempting to relieve symptoms. Denies vaginal bleeding, abnormal vaginal discharge or urinary symptoms. She states that she was supposed to have a [place] but has been unable to get it coordinated due to her work schedule.          OB History     Gravida  4   Para      Term      Preterm      AB  3   Living         SAB  3   IAB      Ectopic      Multiple      Live Births              Past Medical History:  Diagnosis Date   ADHD (attention deficit hyperactivity disorder)    Allergy    Anxiety    Bipolar disorder (HCC)    Depression    Mental disorder    Overdose 12/25/2013   Seizures (HCC) 2022   drug withdrawl    Past Surgical History:  Procedure Laterality Date   DILATION AND CURETTAGE OF UTERUS N/A 09/27/2021   Procedure: DILATATION AND CURETTAGE WITH FROZEN WITH ULTRA SOUND;  Surgeon: Ponce de Leon Bing, MD;  Location: [facility] OR;  Service: Gynecology;  Laterality: N/A;   DILATION AND EVACUATION N/A 12/27/2022   Procedure: DILATATION AND EVACUATION WITH ANORA GENETIC TESTING;  Surgeon: Chenoweth Bing, MD;  Location: [facility] OR;  Service: Gynecology;  Laterality: N/A;    Family History  Problem Relation Age of Onset   Lupus Mother    Hyperlipidemia Father    Migraines Neg Hx    Parkinsonism Neg Hx    Seizures Neg Hx    ADD / ADHD Neg Hx    Bipolar disorder Neg Hx     Social History   Tobacco Use   Smoking status: Former     Types: Cigarettes    Passive exposure: Never   Smokeless tobacco: Never   Tobacco comments:    Quit Jan 2023    quit vaping with preg  Vaping Use   Vaping status: Some Days   Substances: Nicotine, Flavoring  Substance Use Topics   Alcohol use: Not Currently    Comment: drinks on the weekends   Drug use: Not Currently    Types: Marijuana    Comment: percocet, last use marijuana 04/2021    Allergies:  Allergies  Allergen Reactions   Penicillins Rash    No medications prior to admission.    Review of Systems  Constitutional:  Negative for chills, fatigue and fever.  Eyes:  Negative for pain and visual disturbance.  Respiratory:  Negative for apnea, shortness of breath and wheezing.   Cardiovascular:  Negative for chest pain and palpitations.  Gastrointestinal:  Negative for abdominal pain, constipation, diarrhea, nausea and vomiting.  Genitourinary:  Positive for pelvic pain and vaginal pain. Negative for difficulty urinating, dysuria, vaginal bleeding  and vaginal discharge.  Musculoskeletal:  Negative for back pain.  Neurological:  Negative for seizures, weakness and headaches.  Psychiatric/Behavioral:  Negative for suicidal ideas.    Physical Exam   Blood pressure 138/82, pulse 86, temperature 98 F (36.7 C), temperature source Oral, resp. rate 16, last menstrual period 12/15/2023, SpO2 99%, unknown if currently breastfeeding.  Physical Exam Vitals and nursing note reviewed.  Constitutional:      General: She is not in acute distress.    Appearance: Normal appearance.  HENT:     Head: Normocephalic.  Pulmonary:     Effort: Pulmonary effort is normal.  Abdominal:     Tenderness: There is no abdominal tenderness.  Musculoskeletal:     Cervical back: Normal range of motion.  Skin:    General: Skin is warm and dry.  Neurological:     Mental Status: She is alert and oriented to person, place, and time.  Psychiatric:        Mood and Affect: Mood normal.     MAU  Course  Procedures Orders Placed This Encounter  Procedures   Discharge patient   Meds ordered this encounter  Medications   acetaminophen (TYLENOL) tablet 1,000 mg   cyclobenzaprine (FLEXERIL) tablet 10 mg    MDM - PO tylenol and Flexeril ordered.  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 Then again on 02/03/24 with an appropriate rise in Quant.  - CNM reviewed encounter notes where [facility] attempted to get pt a [place] for 2/24 but patient desired closer to work like [facility]. Has not been scheduled as of today.  - Message sent to [facility] and [facility] to get pt scheduled for viability [place] as earliest convince.  - Given appropriate rise and minimal change in symptoms and documented IUP on previous US, repeat scan or quant not necessary at this time.  - plan for discharge.   Assessment and Plan   1. Cramping affecting pregnancy, antepartum   2. [redacted] weeks gestation of pregnancy   3. Physically well but worried    - Reviewed worsening signs and return precautions - Reviewed triage structure of MAU and viability scans are completed Outpatient.  - Recommended to be looking for a myChart message and/or phone call for viability [place] in the next week or so. Patient verbalized understanding.  - Patient discharged home in stable condition and may return to MAU as needed.   Claudette Head, MSN CNM  02/17/2024, 1:23 PM

## 2024-02-17 NOTE — MAU Note (Signed)
.  Ellen Huffman is a 22 y.o. at [redacted]w[redacted]d here in MAU reporting: ongoing cramping that worsened over night.  Denies vag bleeding or pain with urination.  Has not tried any medication for pain.   Onset of complaint: 1 day ago Pain score: 8 Vitals:   02/17/24 1207  BP: 138/82  Pulse: 86  Resp: 16  Temp: 98 F (36.7 C)  SpO2: 99%   Lab orders placed from triage: ua

## 2024-02-19 ENCOUNTER — Ambulatory Visit: Payer: Medicaid Other

## 2024-02-19 ENCOUNTER — Other Ambulatory Visit: Payer: Medicaid Other

## 2024-02-19 ENCOUNTER — Other Ambulatory Visit: Payer: Self-pay

## 2024-02-19 DIAGNOSIS — R109 Unspecified abdominal pain: Secondary | ICD-10-CM

## 2024-02-19 DIAGNOSIS — Z3A01 Less than 8 weeks gestation of pregnancy: Secondary | ICD-10-CM | POA: Diagnosis not present

## 2024-02-19 DIAGNOSIS — Z3491 Encounter for supervision of normal pregnancy, unspecified, first trimester: Secondary | ICD-10-CM

## 2024-02-19 DIAGNOSIS — Z3490 Encounter for supervision of normal pregnancy, unspecified, unspecified trimester: Secondary | ICD-10-CM

## 2024-03-21 ENCOUNTER — Encounter: Payer: Self-pay | Admitting: Advanced Practice Midwife

## 2024-03-21 ENCOUNTER — Other Ambulatory Visit: Payer: Self-pay | Admitting: Obstetrics & Gynecology

## 2024-03-21 DIAGNOSIS — Z3682 Encounter for antenatal screening for nuchal translucency: Secondary | ICD-10-CM

## 2024-03-21 DIAGNOSIS — Z3490 Encounter for supervision of normal pregnancy, unspecified, unspecified trimester: Secondary | ICD-10-CM | POA: Insufficient documentation

## 2024-03-21 DIAGNOSIS — O0990 Supervision of high risk pregnancy, unspecified, unspecified trimester: Secondary | ICD-10-CM | POA: Insufficient documentation

## 2024-03-24 ENCOUNTER — Ambulatory Visit: Payer: Medicaid Other | Admitting: Advanced Practice Midwife

## 2024-03-24 ENCOUNTER — Ambulatory Visit: Payer: Medicaid Other

## 2024-03-24 ENCOUNTER — Encounter: Payer: Medicaid Other | Admitting: *Deleted

## 2024-03-24 ENCOUNTER — Encounter: Payer: Self-pay | Admitting: Advanced Practice Midwife

## 2024-03-24 DIAGNOSIS — Z6832 Body mass index (BMI) 32.0-32.9, adult: Secondary | ICD-10-CM

## 2024-03-24 DIAGNOSIS — Z3A12 12 weeks gestation of pregnancy: Secondary | ICD-10-CM

## 2024-03-24 DIAGNOSIS — Z3682 Encounter for antenatal screening for nuchal translucency: Secondary | ICD-10-CM

## 2024-03-24 DIAGNOSIS — Z3480 Encounter for supervision of other normal pregnancy, unspecified trimester: Secondary | ICD-10-CM

## 2024-03-24 DIAGNOSIS — Z3481 Encounter for supervision of other normal pregnancy, first trimester: Secondary | ICD-10-CM

## 2024-03-24 DIAGNOSIS — F1114 Opioid abuse with opioid-induced mood disorder: Secondary | ICD-10-CM

## 2024-03-24 DIAGNOSIS — Z131 Encounter for screening for diabetes mellitus: Secondary | ICD-10-CM | POA: Diagnosis not present

## 2024-03-24 DIAGNOSIS — Z363 Encounter for antenatal screening for malformations: Secondary | ICD-10-CM

## 2024-03-24 MED ORDER — BLOOD PRESSURE MONITOR MISC
0 refills | Status: AC
Start: 2024-03-24 — End: ?

## 2024-03-24 MED ORDER — PNV PRENATAL PLUS MULTIVITAMIN 27-1 MG PO TABS: 1.0000 | ORAL_TABLET | Freq: Every day | ORAL | 11 refills | Status: DC

## 2024-03-24 MED ORDER — ASPIRIN 81 MG PO TBEC: 162.0000 mg | DELAYED_RELEASE_TABLET | Freq: Every day | ORAL | 6 refills | Status: DC

## 2024-03-24 NOTE — Progress Notes (Signed)
 [place] 12 wks,measurements c/w dates,anterior placenta,bicornuate uterus,pregnancy left,normal ovaries,FHR 165 bpm,CRL 53.77 mm,NT 1 mm,NB present

## 2024-03-24 NOTE — Patient Instructions (Addendum)
 [facility] offers online women's holistic mental health counseling and therapy provided by licensed mental health counselors and therapists.   You can refer yourself using the link below: (if it isn't clickable from your mychart account, copy and paste it in a new browser).  If you have ANY problems, please let me know and I will help troubleshoot.   [email or web address]  OR  [email or web address]    [place], I greatly value your feedback.  If you receive a survey following your visit with [place] today, we appreciate you taking the time to fill it out.  Thanks, Cathie Beams, DNP, CNM  [facility] HAS MOVED!!! It is now [facility] ([address] [place], [place] [address]) Entrance located off of [address] Free 24/7 valet parking   Nausea & Vomiting Have saltine crackers or pretzels by your bed and eat a few bites before you raise your head out of bed in the morning Eat small frequent meals throughout the day instead of large meals Drink plenty of fluids throughout the day to stay hydrated, just don't drink a lot of fluids with your meals.  This can make your stomach fill up faster making you feel sick Do not brush your teeth right after you eat Products with real ginger are good for nausea, like ginger ale and ginger hard candy Make sure it says made with real ginger! Sucking on sour candy like lemon heads is also good for nausea If your prenatal vitamins make you nauseated, take them at night so you will sleep through the nausea Sea Bands If you feel like you need medicine for the nausea & vomiting please let us know If you are unable to keep any fluids or food down please let us know   Constipation Drink plenty of fluid, preferably water, throughout the day Eat foods high in fiber such as fruits, vegetables, and grains Exercise, such as walking, is a  good way to keep your bowels regular Drink warm fluids, especially warm prune juice, or decaf coffee Eat a 1/2 cup of real oatmeal (not instant), 1/2 cup applesauce, and 1/2-1 cup warm prune juice every day If needed, you may take Colace (docusate sodium) stool softener once or twice a day to help keep the stool soft.  If you still are having problems with constipation, you may take Miralax once daily as needed to help keep your bowels regular.   Home Blood Pressure Monitoring for Patients   Your provider has recommended that you check your blood pressure (BP) at least once a week at home. If you do not have a blood pressure cuff at home, one will be provided for you. Contact your provider if you have not received your monitor within 1 week.   Helpful Tips for Accurate Home Blood Pressure Checks  Don't smoke, exercise, or drink caffeine 30 minutes before checking your BP Use the restroom before checking your BP (a full bladder can raise your pressure) Relax in a comfortable upright chair Feet on the ground Left arm resting comfortably on a flat surface at the level of your heart Legs uncrossed Back supported Sit quietly and don't talk Place the cuff on your bare arm Adjust snuggly, so that only two fingertips can fit between your skin and the top of the cuff Check 2 readings separated by at least one minute Keep a log of your BP readings For a visual, please reference this diagram: http://ccnc.care/bpdiagram  Provider Name:  [facility]     Phone: [phone]  Zone 1: ALL CLEAR  Continue to monitor your symptoms:  BP reading is less than 140 (top number) or less than 90 (bottom number)  No right upper stomach pain No headaches or seeing spots No feeling nauseated or throwing up No swelling in face and hands  Zone 2: CAUTION Call your doctor's office for any of the following:  BP reading is greater than 140 (top number) or greater than 90 (bottom number)  Stomach pain  under your ribs in the middle or right side Headaches or seeing spots Feeling nauseated or throwing up Swelling in face and hands  Zone 3: EMERGENCY  Seek immediate medical care if you have any of the following:  BP reading is greater than160 (top number) or greater than 110 (bottom number) Severe headaches not improving with Tylenol Serious difficulty catching your breath Any worsening symptoms from Zone 2    First Trimester of Pregnancy The first trimester of pregnancy is from week 1 until the end of week 12 (months 1 through 3). A week after a sperm fertilizes an egg, the egg will implant on the wall of the uterus. This embryo will begin to develop into a baby. Genes from you and your partner are forming the baby. The female genes determine whether the baby is a boy or a girl. At 6-8 weeks, the eyes and face are formed, and the heartbeat can be seen on ultrasound. At the end of 12 weeks, all the baby's organs are formed.  Now that you are pregnant, you will want to do everything you can to have a healthy baby. Two of the most important things are to get good prenatal care and to follow your health care provider's instructions. Prenatal care is all the medical care you receive before the baby's birth. This care will help prevent, find, and treat any problems during the pregnancy and childbirth. BODY CHANGES Your body goes through many changes during pregnancy. The changes vary from woman to woman.  You may gain or lose a couple of pounds at first. You may feel sick to your stomach (nauseous) and throw up (vomit). If the vomiting is uncontrollable, call your health care provider. You may tire easily. You may develop headaches that can be relieved by medicines approved by your health care provider. You may urinate more often. Painful urination may mean you have a bladder infection. You may develop heartburn as a result of your pregnancy. You may develop constipation because certain hormones are  causing the muscles that push waste through your intestines to slow down. You may develop hemorrhoids or swollen, bulging veins (varicose veins). Your breasts may begin to grow larger and become tender. Your nipples may stick out more, and the tissue that surrounds them (areola) may become darker. Your gums may bleed and may be sensitive to brushing and flossing. Dark spots or blotches (chloasma, mask of pregnancy) may develop on your face. This will likely fade after the baby is born. Your menstrual periods will stop. You may have a loss of appetite. You may develop cravings for certain kinds of food. You may have changes in your emotions from day to day, such as being excited to be pregnant or being concerned that something may go wrong with the pregnancy and baby. You may have more vivid and strange dreams. You may have changes in your hair. These can include thickening of your hair, rapid growth, and changes in texture. Some women  also have hair loss during or after pregnancy, or hair that feels dry or thin. Your hair will most likely return to normal after your baby is born. WHAT TO EXPECT AT YOUR PRENATAL VISITS During a routine prenatal visit: You will be weighed to make sure you and the baby are growing normally. Your blood pressure will be taken. Your abdomen will be measured to track your baby's growth. The fetal heartbeat will be listened to starting around week 10 or 12 of your pregnancy. Test results from any previous visits will be discussed. Your health care provider may ask you: How you are feeling. If you are feeling the baby move. If you have had any abnormal symptoms, such as leaking fluid, bleeding, severe headaches, or abdominal cramping. If you have any questions. Other tests that may be performed during your first trimester include: Blood tests to find your blood type and to check for the presence of any previous infections. They will also be used to check for low iron  levels (anemia) and Rh antibodies. Later in the pregnancy, blood tests for diabetes will be done along with other tests if problems develop. Urine tests to check for infections, diabetes, or protein in the urine. An ultrasound to confirm the proper growth and development of the baby. An amniocentesis to check for possible genetic problems. Fetal screens for spina bifida and Down syndrome. You may need other tests to make sure you and the baby are doing well. HOME CARE INSTRUCTIONS  Medicines Follow your health care provider's instructions regarding medicine use. Specific medicines may be either safe or unsafe to take during pregnancy. Take your prenatal vitamins as directed. If you develop constipation, try taking a stool softener if your health care provider approves. Diet Eat regular, well-balanced meals. Choose a variety of foods, such as meat or vegetable-based protein, fish, milk and low-fat dairy products, vegetables, fruits, and whole grain breads and cereals. Your health care provider will help you determine the amount of weight gain that is right for you. Avoid raw meat and uncooked cheese. These carry germs that can cause birth defects in the baby. Eating four or five small meals rather than three large meals a day may help relieve nausea and vomiting. If you start to feel nauseous, eating a few soda crackers can be helpful. Drinking liquids between meals instead of during meals also seems to help nausea and vomiting. If you develop constipation, eat more high-fiber foods, such as fresh vegetables or fruit and whole grains. Drink enough fluids to keep your urine clear or pale yellow. Activity and Exercise Exercise only as directed by your health care provider. Exercising will help you: Control your weight. Stay in shape. Be prepared for labor and delivery. Experiencing pain or cramping in the lower abdomen or low back is a good sign that you should stop exercising. Check with your  health care provider before continuing normal exercises. Try to avoid standing for long periods of time. Move your legs often if you must stand in one place for a long time. Avoid heavy lifting. Wear low-heeled shoes, and practice good posture. You may continue to have sex unless your health care provider directs you otherwise. Relief of Pain or Discomfort Wear a good support bra for breast tenderness.   Take warm sitz baths to soothe any pain or discomfort caused by hemorrhoids. Use hemorrhoid cream if your health care provider approves.   Rest with your legs elevated if you have leg cramps or low back pain.  If you develop varicose veins in your legs, wear support hose. Elevate your feet for 15 minutes, 3-4 times a day. Limit salt in your diet. Prenatal Care Schedule your prenatal visits by the twelfth week of pregnancy. They are usually scheduled monthly at first, then more often in the last 2 months before delivery. Write down your questions. Take them to your prenatal visits. Keep all your prenatal visits as directed by your health care provider. Safety Wear your seat belt at all times when driving. Make a list of emergency phone numbers, including numbers for family, friends, the hospital, and police and fire departments. General Tips Ask your health care provider for a referral to a local prenatal education class. Begin classes no later than at the beginning of month 6 of your pregnancy. Ask for help if you have counseling or nutritional needs during pregnancy. Your health care provider can offer advice or refer you to specialists for help with various needs. Do not use hot tubs, steam rooms, or saunas. Do not douche or use tampons or scented sanitary pads. Do not cross your legs for long periods of time. Avoid cat litter boxes and soil used by cats. These carry germs that can cause birth defects in the baby and possibly loss of the fetus by miscarriage or stillbirth. Avoid all smoking,  herbs, alcohol, and medicines not prescribed by your health care provider. Chemicals in these affect the formation and growth of the baby. Schedule a dentist appointment. At home, brush your teeth with a soft toothbrush and be gentle when you floss. SEEK MEDICAL CARE IF:  You have dizziness. You have mild pelvic cramps, pelvic pressure, or nagging pain in the abdominal area. You have persistent nausea, vomiting, or diarrhea. You have a bad smelling vaginal discharge. You have pain with urination. You notice increased swelling in your face, hands, legs, or ankles. SEEK IMMEDIATE MEDICAL CARE IF:  You have a fever. You are leaking fluid from your vagina. You have spotting or bleeding from your vagina. You have severe abdominal cramping or pain. You have rapid weight gain or loss. You vomit blood or material that looks like coffee grounds. You are exposed to [place] measles and have never had them. You are exposed to fifth disease or chickenpox. You develop a severe headache. You have shortness of breath. You have any kind of trauma, such as from a fall or a car accident. Document Released: 12/05/2001 Document Revised: 04/27/2014 Document Reviewed: 10/21/2013 [facility] Patient Information 2015 [place], [place]. This information is not intended to replace advice given to you by your health care provider. Make sure you discuss any questions you have with your health care provider.  ADDITIONAL HEALTHCARE OPTIONS FOR PATIENTS  [facility] / e-Visit: [email or web address]         [facility]: [phone]  [facility]: [phone]                   [facility]: [phone]     Safe Medications in Pregnancy   Acne: Benzoyl Peroxide Salicylic Acid  Backache/Headache: Tylenol: 2 regular strength every 4 hours OR              2 Extra strength every 6 hours  Colds/Coughs/Allergies: Benadryl  (alcohol free) 25 mg every 6 hours as needed Breath right strips Claritin Cepacol throat lozenges Chloraseptic throat spray Cold-Eeze- up to three times per day Cough drops, alcohol free Flonase (by prescription only) Guaifenesin Mucinex Robitussin DM (plain  only, alcohol free) Saline nasal spray/drops Sudafed (pseudoephedrine) & Actifed ** use only after [redacted] weeks gestation and if you do not have high blood pressure Tylenol Vicks Vaporub Zinc lozenges Zyrtec   Constipation: Colace Ducolax suppositories Fleet enema Glycerin suppositories Metamucil Milk of magnesia Miralax Senokot Smooth move tea  Diarrhea: Kaopectate Imodium A-D  *NO pepto Bismol  Hemorrhoids: Anusol Anusol HC Preparation H Tucks  Indigestion: Tums Maalox Mylanta Zantac  Pepcid  Insomnia: Benadryl (alcohol free) 25mg  every 6 hours as needed Tylenol PM Unisom, no Gelcaps  Leg Cramps: Tums MagGel  Nausea/Vomiting:  Bonine Dramamine Emetrol Ginger extract Sea bands Meclizine  Nausea medication to take during pregnancy:  Unisom (doxylamine succinate 25 mg tablets) Take one tablet daily at bedtime. If symptoms are not adequately controlled, the dose can be increased to a maximum recommended dose of two tablets daily (1/2 tablet in the morning, 1/2 tablet mid-afternoon and one at bedtime). Vitamin B6 100mg  tablets. Take one tablet twice a day (up to 200 mg per day).  Skin Rashes: Aveeno products Benadryl cream or 25mg  every 6 hours as needed Calamine Lotion 1% cortisone cream  Yeast infection: Gyne-lotrimin 7 Monistat 7   **If taking multiple medications, please check labels to avoid duplicating the same active ingredients **take medication as directed on the label ** Do not exceed 4000 mg of tylenol in 24 hours **Do not take medications that contain aspirin or ibuprofen   ([phone] is the phone number for Pregnancy Classes or hospital tours at [facility].    You will be referred to  [email or web address]   for more information on childbirth classes   At this site you may register for classes. You may sign up for a waiting list if classes are full. Please SIGN UP FOR THIS!.   When the waiting list becomes long, sometimes new classes can be added.  [facility] Call to Register: [phone] or [phone]   or   Register Online: [email or web address] THESE CLASSES FILL UP VERY QUICKLY, SO SIGN UP AS SOON AS YOU CAN!!! Please visit [facility] pregnancy website at [email or web address]  Childbirth Classes  Option 1: Birth & Baby Series Series of 3 weekly classes, on the same day of the week (can choose Mon-Thurs) from 6-9pm Helps you and your support person prepare for childbirth Reviews newborn care, labor & birth, cesarean birth, pain management, and comfort techniques Cost: $60 per couple for insured or self-pay, $30 per couple for Medicaid  Option 2: Weekend Birth & Baby This class is a weekend version of our Birth & Baby series.  It is designed for parents who have a difficult time fitting several weeks of classes into their schedule.   Covers the care of your newborn and the basics of labor and childbirth Friday 6:30pm-8:30pm Saturday 9am-4pm, includes lunch for you and your partner  Cost: $75 per couple for insured or self-pay, $30 per couple for Medicaid  Option 3: Natural Childbirth This series of 5 weekly classes is for expectant parents who want to learn and practice natural methods of coping with the process of labor and childbirth.  Can choose Mon or Tues, 7-9pm.   Covers relaxation, breathing, massage, visualization, role of the partner, and helpful positioning Participants learn how to be confident in their body's ability to give birth. Class empowers and helps parents make informed decisions about care. Includes  discussion that will help new parents transition into the immediate postpartum period.  Cost: $75 per couple for  insured or self-pay, $30 per couple for Medicaid  Option 4: Online Birth & Baby This online class offers you the freedom to complete a Birth & Baby series in the comfort of your own home.  The flexibility of this option allows you to review sections at your own pace, at times convenient to you and your support people.  It includes additional video information, animations, quizzes and extended activities. Get organized with helpful eClass tools, checklists, and trackers.  Cost: $60 for 60 days of online access                                                                            Other Available Classes  Baby & Me Enjoy this time to discuss newborn & infant parenting topics and family adjustment issues with other new mothers in a relaxed environment. Each week brings a new speaker or baby-centered activity. We encourage mothers and their babies (birth to crawling) to join [place]. You are welcome to visit this group even if you haven't delivered yet! It's wonderful to make new friends early and watch other moms interact with their babies. No registration or fee.  Big Brother/Big Sister Let your children share in the joy of a new brother or sister in this special class designed just for them. Discussion includes how families care for babies: swaddling, holding, diapering, safety, as well as how they can be helpful in their new role. This class is designed for children ages 2 to 14, but any age is welcome. Please register each child individually. $5 Breastfeeding Support Group This group is a mother-to-mother support circle where moms have the opportunity to share their breastfeeding experiences. A Breastfeeding Support nurse is present for questions and concerns. An infant scale is available for weight checks. No fee or registration.  Breastfeeding Your Baby Breastfeeding is a special time  for mother and child. This class will help you feel ready to begin this important relationship. Your partner is encouraged to attend with you. Learn what to expect and feel more confident in the first days of breastfeeding your newborn. This class also addresses the most common fears and challenges of breastfeeding during the first few weeks, months, and beyond. $30 per couple Caring for Baby This class is for expectant and adoptive parents who want to learn and practice the most up-to-date newborn care for their babies. Focus is on birth through first six weeks of life. Topics include feeding, bathing, diapering, crying, umbilical cord care, circumcision care and safe sleep. Parents learn how to recognize symptoms of illness and when to call the pediatrician. Register only the mom-to-be and your partner can plan to come with you. (*Note: This class is included in the Birth & Baby series and the Weekend Birth & Baby classes.) $10 per couple Comfort Techniques & Tour This 2-hour interactive class is designed for those who either do not wish to take the Birth & Baby series or for those who prefer our online childbirth class, but don't want to miss the opportunity to learn and practice hands-on techniques. These skills can help relieve some of the discomfort of labor and encourage your baby to rotate toward the best position for birth. You and your partner  will be able to try a variety of labor positions with birth balls and rebozos as well as practice breathing, relaxation, and visual techniques. $20 per couple [employer] [facility] This course offers Dads-to-be the tools and knowledge needed to feel confident on their journey to becoming new fathers. Experienced dads, who have been trained as coaches, teach dads-to-be how to hold, comfort, diapers, swaddle and play with their infant while being able to support the new mom as well. $25 Grandparent Love Expecting a grandbaby? Learn about the latest infant care and  safety recommendations and ways to support your own child as he or she transitions into the parenting role. $10 per person Infant and Child CPR Parents, grandparents, babysitters, and friends learn Cardio-Pulmonary Resuscitation skills for infants and children. You will also learn how to treat both conscious and unconscious choking infants and children. Register each participant individually. (Note: This Family & Friends program does not offer certification.) $20 per person Marvelous Multiples Expecting twins, triplets, or more? This free 2-hour class covers the differences in labor, birth, parenting, and breastfeeding issues that face multiples' parents.  Maternity Care Center Virtual Tour  Online virtual tour of the new [place] [facility] Talk This free mom-led group offers support and connection to mothers as they journey through the adjustments and struggles of that sometimes overwhelming first year after the birth of a child. A member of our staff will be present to share resources and additional support if needed, as you care for yourself and baby. You are welcome to visit this group before you deliver! It's wonderful to meet new friends early and watch other moms interact with their babies.  Waterbirth Class Interested in a waterbirth? This free informational class will help you discover whether waterbirth is the right fit for you and is required if you are planning a waterbirth. Education about waterbirth itself, supplies you may need, and what you may need from your support team is included in this class. Partners are encouraged to come.

## 2024-03-24 NOTE — Progress Notes (Signed)
 INITIAL OBSTETRICAL VISIT Patient name: Ellen Huffman MRN [id number]  Date of birth: [birth year] Chief Complaint:   Initial Prenatal Visit  History of Present Illness:   Ellen Huffman is a 23 y.o. G69P0030 Caucasian female at [redacted]w[redacted]d by [place] at 7 weeks with an Estimated Date of Delivery: 10/06/24 being seen today for her initial obstetrical visit.   Her obstetrical history is significant for 3 early SABs.   Today she reports no complaints.  Nausea is better .     03/24/2024    1:35 PM 01/18/2023   10:44 AM 12/08/2022    9:27 AM 11/14/2021    1:50 PM 08/09/2021   10:40 AM  Depression screen PHQ 2/9  Decreased Interest 1 1 0 1   Down, Depressed, Hopeless 0 1 0 1   PHQ - 2 Score 1 2 0 2   Altered sleeping 0 1 0 1   Tired, decreased energy 3 1 0 2   Change in appetite 0 1 0 2   Feeling bad or failure about yourself  0 1 0 0   Trouble concentrating 0 0 0 0   Moving slowly or fidgety/restless 0 0 0 0   Suicidal thoughts 0 0 0 0   PHQ-9 Score 4 6 0 7   Difficult doing work/chores   Not difficult at all Not difficult at all      Information is confidential and restricted. Go to Review Flowsheets to unlock data.    Patient's last menstrual period was 12/15/2023. Last pap No results found for: "DIAGPAP" Review of Systems:   Pertinent items are noted in HPI Denies cramping/contractions, leakage of fluid, vaginal bleeding, abnormal vaginal discharge w/ itching/odor/irritation, headaches, visual changes, shortness of breath, chest pain, abdominal pain, severe nausea/vomiting, or problems with urination or bowel movements unless otherwise stated above.  Pertinent History Reviewed:  Reviewed past medical,surgical, social, obstetrical and family history.  Reviewed problem list, medications and allergies. OB History  Gravida Para Term Preterm AB Living  4    3   SAB IAB Ectopic Multiple Live Births  3        # Outcome Date GA Lbr Len/2nd Weight Sex Type Anes PTL Lv  4 Current           3 SAB  05/2023          2 SAB 11/2022 [redacted]w[redacted]d    SAB     1 SAB 09/27/21 [redacted]w[redacted]d          Physical Assessment:  There were no vitals filed for this visit.There is no height or weight on file to calculate BMI.       Physical Examination:  General appearance - well appearing, and in no distress  Mental status - alert, oriented to person, place, and time  Psych:  She has a normal mood and affect  Skin - warm and dry, normal color, no suspicious lesions noted  Chest - effort normal  Heart - normal rate and regular rhythm  Abdomen - soft, nontender  Extremities:  No swelling or varicosities noted    TODAY'S NT [place] 12 wks,measurements c/w dates,anterior placenta,bicornuate uterus,pregnancy left,normal ovaries,FHR 165 bpm,CRL 53.77 mm,NT 1 mm,NB present   No results found for this or any previous visit (from the past 24 hours).   Indications for ASA therapy (per uptodate) One of the following:  Two or more of the following: Nulliparity Yes Obesity (body mass index >30 kg/m2) Yes      03/24/2024  1:35 PM 01/18/2023   10:44 AM 12/08/2022    9:27 AM 11/14/2021    1:50 PM 08/09/2021   10:40 AM  Depression screen PHQ 2/9  Decreased Interest 1 1 0 1   Down, Depressed, Hopeless 0 1 0 1   PHQ - 2 Score 1 2 0 2   Altered sleeping 0 1 0 1   Tired, decreased energy 3 1 0 2   Change in appetite 0 1 0 2   Feeling bad or failure about yourself  0 1 0 0   Trouble concentrating 0 0 0 0   Moving slowly or fidgety/restless 0 0 0 0   Suicidal thoughts 0 0 0 0   PHQ-9 Score 4 6 0 7   Difficult doing work/chores   Not difficult at all Not difficult at all      Information is confidential and restricted. Go to Review Flowsheets to unlock data.        03/24/2024    1:35 PM 01/18/2023   10:44 AM 12/08/2022    9:28 AM 11/14/2021    1:51 PM  GAD 7 : Generalized Anxiety Score  Nervous, Anxious, on Edge 2 3 0 1  Control/stop worrying 3 3 0 1  Worry too much - different things 3 3 0 1  Trouble relaxing 1 2  0 1  Restless 0 2 0 1  Easily annoyed or irritable 2 3 2 2   Afraid - awful might happen 2 1 2 1   Total GAD 7 Score 13 17 4 8   Anxiety Difficulty   Not difficult at all Not difficult at all      Assessment & Plan:  1) Low-Risk Pregnancy G4P0030 at [redacted]w[redacted]d with an Estimated Date of Delivery: 10/06/24   2) Initial OB visit    1. Supervision of other normal pregnancy, antepartum (Primary)  - Integrated 1 - Urine Culture - [facility] AMB BABYSCRIPTS SCHEDULE OPTIMIZATION - Blood Pressure Monitor MISC; For regular home bp monitoring during pregnancy  Dispense: 1 each; Refill: 0 - CBC/D/Plt+RPR+Rh+ABO+RubIgG... - GC/Chlamydia Probe Amp - Hemoglobin A1c - PANORAMA PRENATAL TEST - [facility] CUSTOM  2. Diabetes mellitus screening  - Hemoglobin A1c  3. [redacted] weeks gestation of pregnancy  - Integrated 1 - Urine Culture - [facility] AMB BABYSCRIPTS SCHEDULE OPTIMIZATION - Blood Pressure Monitor MISC; For regular home bp monitoring during pregnancy  Dispense: 1 each; Refill: 0 - CBC/D/Plt+RPR+Rh+ABO+RubIgG... - GC/Chlamydia Probe Amp - Hemoglobin A1c - PANORAMA PRENATAL TEST - [facility] CUSTOM  4. BMI 32.0-32.9,adult ASA 162mg  - Hemoglobin A1c       Meds:  Meds ordered this encounter  Medications   Blood Pressure Monitor MISC    Sig: For regular home bp monitoring during pregnancy    Dispense:  1 each    Refill:  0    Z34.81 Please mail to patient    Initial labs obtained Continue prenatal vitamins Reviewed n/v relief measures and warning s/s to report Reviewed recommended weight gain based on pre-gravid BMI Encouraged well-balanced diet Genetic & carrier screening discussed: requests Panorama, NT/IT, and Horizon , declines AFP Ultrasound discussed; fetal survey: requested CCNC completed> form faxed if has or is planning to apply for medicaid The nature of [place] [employer] with multiple MDs and other Advanced Practice Providers was explained to patient;  also emphasized that fellows, residents, and students are part of our team. Doesn't have a home bp cuff. Rx faxed . Check bp weekly, let us know if >  140/90.        Jacklyn Shell 2:34 PM

## 2024-03-25 LAB — INTEGRATED 1

## 2024-03-26 ENCOUNTER — Encounter: Payer: Self-pay | Admitting: Advanced Practice Midwife

## 2024-03-26 ENCOUNTER — Other Ambulatory Visit: Payer: Self-pay | Admitting: *Deleted

## 2024-03-26 LAB — URINE CULTURE: Organism ID, Bacteria: NO GROWTH

## 2024-03-26 LAB — GC/CHLAMYDIA PROBE AMP
Chlamydia trachomatis, NAA: NEGATIVE
Neisseria Gonorrhoeae by PCR: NEGATIVE

## 2024-03-26 MED ORDER — PNV PRENATAL PLUS MULTIVITAMIN 27-1 MG PO TABS: 1.0000 | ORAL_TABLET | Freq: Every day | ORAL | 11 refills | Status: DC

## 2024-03-26 MED ORDER — ASPIRIN 81 MG PO TBEC: 162.0000 mg | DELAYED_RELEASE_TABLET | Freq: Every day | ORAL | 6 refills | Status: DC

## 2024-03-31 ENCOUNTER — Encounter: Payer: Self-pay | Admitting: Advanced Practice Midwife

## 2024-04-01 ENCOUNTER — Encounter: Payer: Self-pay | Admitting: Advanced Practice Midwife

## 2024-04-01 LAB — HORIZON CUSTOM: REPORT SUMMARY: NEGATIVE

## 2024-04-02 LAB — PANORAMA PRENATAL TEST FULL PANEL:PANORAMA TEST PLUS 5 ADDITIONAL MICRODELETIONS: FETAL FRACTION: 4.1

## 2024-04-14 ENCOUNTER — Ambulatory Visit: Admitting: Obstetrics and Gynecology

## 2024-04-14 ENCOUNTER — Other Ambulatory Visit (HOSPITAL_COMMUNITY)
Admission: RE | Admit: 2024-04-14 | Discharge: 2024-04-14 | Disposition: A | Discharge status: Home / Self Care | Source: Ambulatory Visit | Attending: Obstetrics and Gynecology | Admitting: Obstetrics and Gynecology

## 2024-04-14 ENCOUNTER — Encounter: Payer: Self-pay | Admitting: Obstetrics and Gynecology

## 2024-04-14 VITALS — BP 122/80 | HR 80 | Wt 200.0 lb

## 2024-04-14 DIAGNOSIS — Z1379 Encounter for other screening for genetic and chromosomal anomalies: Secondary | ICD-10-CM | POA: Diagnosis not present

## 2024-04-14 DIAGNOSIS — Z124 Encounter for screening for malignant neoplasm of cervix: Secondary | ICD-10-CM | POA: Insufficient documentation

## 2024-04-14 DIAGNOSIS — Z3A15 15 weeks gestation of pregnancy: Secondary | ICD-10-CM | POA: Diagnosis not present

## 2024-04-14 DIAGNOSIS — Z3480 Encounter for supervision of other normal pregnancy, unspecified trimester: Secondary | ICD-10-CM

## 2024-04-14 DIAGNOSIS — Z3482 Encounter for supervision of other normal pregnancy, second trimester: Secondary | ICD-10-CM

## 2024-04-14 NOTE — Patient Instructions (Addendum)
 [facility] Pediatric Providers  [place] ([id number]) [facility] Manson Passey, MD; Deirdre Priest, MD; Lum Babe, MD; Leveda Anna, MD; McDiarmid, MD; Jerene Bears, MD [address] [place], [place] [address] [phone] Mon-Fri 8:30-12:30, 1:30-5:00  Providers come to see babies during newborn hospitalization Only accepting infants of Mother's who are seen at [facility] or have siblings seen at   [facility] Medicaid - Yes; Tricare - Yes   [facility], MD [address] [place], [place] [address] [phone] Mon, Tue, Thur, Fri 8:30-5:00, Wed 10:00-7:00 (closed 1-2pm daily for lunch) [facility] residents with no insurance.  [place] [employer] only with Medicaid/insurance; Tricare - no  [facility] for [facility]) - Tim [facility], MD; Manson Passey, MD; Ave Filter, MD; Luna Fuse, MD; Kennedy Bucker, MD; Florestine Avers, MD; Melchor Amour, MD; Yetta Barre,  MD; Konrad Dolores, MD; Kathlene November, MD; Jenne Campus, MD; Wynetta Emery, MD; Duffy Rhody, MD; Gerre Couch, NP [address] [place], [place], [place] [address] [phone] Mon, Tue, Thur, Fri 8:30-5:30, Wed 9:30-5:30, Sat 8:30-12:30 Only accepting infants of first-time parents or siblings of current patients Hospital discharge coordinator will make follow-up appointment Medicaid - yes; Tricare - yes  [place] [phone]) [facility] Ilean China, MD; Earlene Plater, MD; Jamesetta Orleans, MD; Alvera Novel, MD; Rana Snare, MD; [facility], MD; Shaaron Adler, MD; Hosie Poisson, MD; Mayford Knife, MD [address] [place], [place] [address] [phone] Mon-Fri 8:30-5:00, closed for lunch 12:30-1:30; Sat-Sun 10:00-1:00 Accepting Newborns with commercial insurance only, must call prior to delivery to be accepted into  practice.  Medicaid - no, Tricare - yes   [facility] [address] [place], [place] [address] [phone] or [phone] Mon to Fri 8am to 10pm, Sat 8am to 1pm  (virtual only on weekends) Only accepts Medicaid Healthy Blue pts  [facility] ([facility]) - Pediatrics at Elige Radon, MD; Sabino Dick, MD; Quitman Livings, MD; Betha Loa, NP; Claretha Cooper, MD; Lelon Perla, MD [address] [place], [place] [address] [phone] Mon-Fri 8:30-5:30 Medicaid - yes, Tricare - yes  [place] [phone]) [facility] Marcie Mowers, MD [address], [place], [place] [address] [phone] Iona Hansen, Wed Fri 8:30-5:00, Sat 8:30-12:00, Closed Thursdays Accepting siblings of established patients and first time mom's if you call prenatally Medicaid- yes; Tricare - yes  [facility] Lutricia Feil, [place]; Tracie Harrier, MD; Rusty Aus; Scifres, PA; Wynelle Link, MD; Azucena Cecil, MD;  [address] [place], [place] [address] [phone] Mon-Fri 8:30-5:00, closed for lunch 1-2 Only accepting newborns of established patients Medicaid- no; Tricare - yes  [facility] Jasper Ruminski [place] [phone]) [facility] Morene [place], MD; [address], [place], [place] [address] [phone] Mon-Fri 8:00-5:00 Medicaid - No; Tricare - Yes  [facility], [place]; [place], [place] [address], [place], [place] [address] [phone] Mon-Fri 8:00-5:00 Medicaid - No, Tricare - Yes  [facility] Cardell Peach, MD; Nash Dimmer, MD; [place], [place] [address] [place], [place] [address] [phone]  Mon-Fri 8:00-5:00 Medicaid - No; Tricare - Yes  [facility] [address] [place], [place] [address] [phone] Mon-Fri 8:30-5:00 (lunch 12:00-1:00) Medicaid -Yes; Tricare - Yes  [facility], MD [address] [place], [place], [place] [address] [phone] Mon-Fri 8:00-5:00 Seeing newborns of current patients only. No new patients Medicaid - No, Tricare - yes  Nature conservation officer at [place] [address], MD [address] [place], [place] [address] [phone] Mon-Fri 8:00-5:00 Medicaid -yes as secondary coverage only;  Tricare - yes  [facility] [place], [place]; [place], [place]; Avis Epley, MD; Vonna Kotyk, MD; Clance Boll, MD; [place], [place]; Smoot, NP; Vaughan Basta, MD; [place], MD [address] [place], [place] [address] [phone] Mon-Fri 8:30-5:00, Sat 9:00-11:00 Accepts commercial insurance ONLY. Offers free prenatal information sessions for families. Medicaid - No, Tricare - Call first  [facility], MD; [place], [place]; [place], [place]; [place], [place] [address] [place] [address] [phone] Mon-Fri 7:30-5:30 Medicaid - Yes; Ailene Rud yes  [place] [phone] & [phone])  [facility], MD [address], [place], [place] [address] [phone] Mon-Thur 8:00-6:00, closed for lunch 12-2, closed Fridays Medicaid - yes; Tricare - no  [facility] Dareen Piano, NP; Cyndia Bent, MD; [place], [place]; [place], [place] [address], [place], [place] [address] [phone] Mon-Fri 7:30-4:30 Medicaid - yes, Tricare - yes  [facility]  Juanito Doom, MD; Janene Harvey, NP; Vonita Moss, MD; Donn Pierini, NP [address], [place], [place] [address] [phone] Mon-Fri 8:30-5:00, closed for lunch 1-2, Sat 8:30-12:00 - sick visits only Providers come to see babies at [facility] Only accepting newborns of siblings and first time parents ONLY if who have met with office prior to delivery Medicaid -Yes; Tricare - yes  [facility] - [place], [place]; Spero Geralds, NP; Earlene [place], MD; Lucretia Roers, MD:  [address], [place], [place] [address] [phone] Mon- Fri 8:00-5:00, Sat 9:00-12:00 - sick visits only Accepting siblings of established patients and first time mom/baby Medicaid - Yes; Tricare - yes Patients must have vaccinations (baby vaccines)  [place] [phone] &  [phone])  Adult nurse HealthCare at [facility] [address] [place], [place] [address] [phone] Mon-Fri 8:00-5:00 Medicaid - no; Tricare - yes  [facility] Hamel, MD; [place], [place]; [place], [place] [address], [place], [place] [address] [phone] Mon-Fri 8:00-5:00 Medicaid- yes; Tricare - yes  [facility] - Ardeen Jourdain, MD; Yetta Barre, NP; [place], [place] [address] [place], [place], [place] [address] [phone] Mon-Fri 8:00-5:00 Medicaid - Yes; Tricare - yes  [address] [place] [phone])  [facility] [place], [place]; [place], [place]; Eddie Candle, MD; Normand Sloop, MD; [place], NP; Isenhour, DO; [place], [place]; Constance Goltz, MD; Ruthann Cancer, MD; Vear Clock, MD; [place], [place]; [place], [place]; [place], [place] [facility] [address], [place], [place] [address] [phone] Mon-Fri 8:30-5:00, Sat 9:00-12:00 - sick only Please register online [email or web address] then schedule online or call office Medicaid-Yes; Tricare -yes  [facility] - Premier  Dabrusco, MD; Romualdo Bolk, MD; Keener, MD; [place], NP; [place], [place]; Antonietta Barcelona, MD; Mayford Knife, NP; Shelva Majestic, MD [address], [place], [place] [address] [phone] Mon-Fri 8:00-5:30, Sat&Sun by appointment (phones open at 8:30) Medicaid - Yes; Tricare - yes  [place] [phone] & [phone]) [facility] Mariel Aloe; Chapin, MD; Roger Shelter, MD; Arvilla Market, NP; Obetz, DO [address], [place], [place] [address] [phone] M-F 8:00 - 5:15, Sat/Sun 9-12 sick visits only Medicaid - No; Tricare - yes  [facility]  Was[place], PA-C; Opal, PA-C; [place]on, DO; Morris, PA-C; [place]sted, PA-C; Roselyn Bering, MD [address], [place], [place] [address] [phone] Mon-Thur 8:00-7:00, Fri 8:00-5:00 Accepting Medicaid for 13 and under only   [facility] Family Medicine  at [place] [facility] [place]) [place], [place]; List, FNP; Berneda Rose, MD; [place], PA-C; Scholer,  MD; Kellie Simmering, FNP; Genevie Cheshire, FNP; Evaristo Bury, MD; Berneda Rose, MD [phone] [address], [place], [place] [address] [phone] Mon-Fri 8:30-5:30 Medicaid - Yes; Tricare - yes  [facility] - [address]  [place], [place]; Whitney Post, MD; Hennie Duos, MD; Wynne Dust, MD; [place], NP [address], [place], [place] [address] [phone] Mon-Thur 8:00-5:30, Fri 8:00-5:00, Sat 9:00-12:00 Medicaid - yes, Tricare - yes  [place] [phone])  [facility], [place]; Lenise Arena, MD; [place], [place] [address], [place], [place] [address] [phone] Mon-Fri 8:00-5:00, closed for lunch 12-1 Medicaid - No; Tricare - yes  Nature conservation officer at [facility], MD [address] [place], [place] [address] [phone] Mon-Fri 8:00-5:00 Medicaid - No; Tricare - yes  [facility], MD; Tami Ribas, MD; Mariam Dollar, MD; Yetta Barre, MD [address] [facility] [address], [place], [place] [address] [phone] Mon-Fri 8:00-5:00 Medicaid- Yes; Tricare - yes  [place] [phone])  Adult nurse HealthCare at [facility] [place], [place]; [place], MD [address] [place], [place], [place] [address] [phone] Mon-Fri 8:00-5:00 Medicaid - No; Tricare - yes  [facility] Medicine - Whitney Post - CPNP [address] [place], [place], [place] [address] [phone] Mon-Weds 8:00-6:00, Thurs-Fri 8:00-5:00, Sat 9:00-12:00 Medicaid - yes; Tricare - yes   [facility] Katharina Caper, MD; [place], [place] [address] [place], [place] [address] [phone] Mon-Fri 8:00-5:00 Medicaid - yes; Tricare - yes  [facility] Pediatric Providers  [facility] [address], [place], [place] [address] [phone] Sheral Flow: 8am -8pm, Tues, Weds: 8am - 5pm; Fri: 8-1 Medicaid - Yes; Tricare -  yes  [facility] Rachel Bo, MD; Laural Benes, MD; Anner Crete, MD; [place], [place]; [place], [place] [address] [place], [place] [address] [phone] M-F 8:30 - 5:00 Medicaid - Call office; Tricare -yes  [facility] Edson Snowball, MD; Shanon Rosser, MD, Chelsea Primus, MD; Shirlyn Goltz, PNP; Wardell Heath, NP [phone] [address], [place], [place] [address] [phone] M-F 8:30 - 5:00, Sat/Sun 8:30 - 12:30 (sick visits) Medicaid - Call office; Tricare -yes  [facility] Melvyn Neth, MD; Karl Luke, PNP; Princess Bruins, MD; [place], [place]; McSwain, NP; Cynda Familia [address], [place], [place] [address] [phone] M-F 8:30 - 5:00 Medicaid - Call office; Tricare - yes  [facility] Jesusita Oka, MD; Dierdre Highman, MD; Earnest Conroy, MD; Timothy Lasso, MD; Nogo, MD [phone] [address] [place], [place] [address] [phone] M-Thur: 8:00 - 5:00; Fri: 8:00 - 4:00 Medicaid - yes; Tricare - yes  [facility] [address] [place], [place] [address] [phone] M-F: 8:30- 5:00, closed for lunch 12:30 - 1:00 Medicaid - yes; Tricare -yes  [facility] [address] [place], [place] [address] [phone] M-F 8:00 - 5:00 Medicaid - yes; Tricare - yes  [facility] Opal, DO; [place], DO; [place], NP [address], [place], [place] [address] [phone] M-F 8:00 - 5:00, Closed 12-1 for lunch Medicaid - Call; Tricare - yes  [facility] Meredith Mody, MD [address] [place], [place] [address] [phone] M-F: 8:00-5:00, Sat: 8:00 - noon Medicaid - call; Tricare -yes  [facility] Pediatric Providers  [facility] [place], [place] [address] [place], [place], [place] [address] [phone] M-W: 8:00-5:00, Thur: 8:00 - 7:00, Fri: 8:00 - noon Medicaid - yes; Tricare - yes  [facility] - Quay Burow, FNP [address], [place],  [place] [address] [phone] M-F 8:00 - 5:00, Closed for lunch 12-1 Medicaid -  yes; Tricare - yes  [facility] Pediatric Providers  [facility] [place], [place], Alinda Money, MD, [place], FNP-C [address] [facility], [address], [place], [place] [address] [phone] M-T 8:00-5:00, Wed-Fri 7:00-6:00 Medicaid - Yes; Tricare -yes  [facility] at [facility], [place]; [address], [address] [place], [place] [address] [phone] M-F 8:00 - 5:00, closed for lunch 12-1 Medicaid - Yes; Tricare - yes  [facility]s and Internal Medicine  Zachery Dauer, MD; Gladstone Lighter, MD; Collie Siad, MD; Freda Jackson, MD; Rich Number, MD; Darryl Nestle, MD; Melinda Crutch, MD, Audria Nine, MD; Tawanna Cooler, MD; Steffanie Dunn, MD; Byrd Hesselbach, MD; Lucretia Roers, MD [address] [place], [place] [address] [phone] M-F 8:00-5:00 Medicaid - yes; Tricare - yes  [facility] [place], MD (speaks [place] and Hindi) [address] [place], [place] [address] [phone] M-F: 8:30 - 5:00, closed 12:30 - 1 for lunch Medicaid - Yes; Tricare -yes  [facility] Pediatric Providers  [facility] Shanda Bumps, MD; Chanetta Marshall, MD; Laurell Josephs, MD [address] [place], [place] [address] [phone] M-Th: 8:00 - 5:30, Fri: 8:00 - 12:00 Medicaid - yes; Tricare - yes  [facility]s at [facility], NP; Thora Lance, MD; Orrin Brigham, MD [phone] [address] [place], [place] [address] [phone] M-F: 8:00 - 5:00 Medicaid - yes; Tricare - yes  [facility] Wh[place], NP; Orson Slick, NP; Salley Scarlet, NP; Linton Flemings, MD; Mayford Knife, MD, [place], NP, Emelda Fear, MD; Nida Boatman [address], [place], [place] [address] [phone] M-F: 8:30 - 5:30p Medicaid - yes; Tricare - yes Other locations available as well  [facility], MD; Andrey Campanile, MD; Neville Route, PA-C [address] [place], [place] [address] [phone] M-W: 8:00am - 7:00pm, Thurs: 8:00am - 8:00pm; Fri: 8:00am -  5:00pm, closed daily from 12-1 for lunch Medicaid - yes; Tricare - yes  [facility] Pediatric Providers  [facility] at Levin Erp, MD; Aggie Cosier, FNP; Bland Span, MD; Tristan Schroeder, MD; [place], PNP; Alesia Banda; [place], [place]; Julian Reil, MD;  [address], [place], [place] [address] [phone] Judie Petit - Caleen Essex: 8am - 5pm, Sat 9-noon Medicaid - Yes; Tricare -yes  [facility] at Jaclynn Guarneri, MD; Yetta Barre, FNP; Lilian Kapur, MD; Mariam Dollar, MD [address] [place], [place] [phone] M-F 8:00 - 5:00 Medicaid - call; Tricare - yes  [facility]- Cruz Condon, MD; [place], [place]; Delora Fuel, MD; Dareen Piano, MD; Trudee Grip, MD; Kizzie Ide, MD; Zebedee Iba; Birdena Crandall, MD; Hinton Dyer, MD; [place], MD [address], [place], [place] [address] [phone] M-F 8:00am - 5:00pm; Sat. 9:00 - 11:00 Medicaid - yes; Tricare - yes  [facility] at [facility], MD [address] [place], [place] [address] [phone] M-F 8:00 - 5:00 Medicaid - [place] Medicaid only; Tricare - yes  [facility] - Illene Bolus, MD; Earlene [place], [place]; Kenyon Ana, MD [address], [place], [place] [address] [phone] M-F 8:00 - 5:00 Medicaid - yes; Tricare - yes  [facility] [address] Pediatrics - Lind Covert, MD; Manson Passey, MD, [facility], MD, [place], MD; [place], MD; Katrinka Blazing, MD; [address] [place], [place] [address] [phone] M-F: 8-5 Medicaid - yes; Tricare - yes  [facility] - Henrietta Hoover, [place]; [place], MD; [address], [place], [place] [address] [phone] M-F 8-5 Medicaid - yes; Tricare - yes  [address] [place] Darrol Poke, MD; Tami Ribas, MD;  [place], MD; [place], DNP; [place], PNP [address], [place], [place] [address] [phone] M-F 8-5 Medicaid - yes; Tricare - yes  [facility] and Pediatrics Delories Heinz, MD;  Adrienne Mocha; Ala Bent, MD [address] [place], [place] [address] [phone] M-F 7am - 5 pm Medicaid - call; Tricare - yes  [facility] Pa Elliott, [place]; Fredia Beets, MD; Roxan Hockey, MD [address] [place], [place] [address] [phone] M-F 8-5 Medicaid - yes; Tricare - yes  [facility] Kae Heller, MD; Sheliah Hatch, MD; Mayford Knife, FNP; Shon Baton, FNP; Tyron Russell, FNP; Ishmael Holter; [facility] - FNP [address], [place], [place] [address] [phone] M-F 8-5 Medicaid- yes; Tricare - yes  [facility] - Betsy Coder, [place] and Chalmers Guest, MD; Terrial Rhodes, MD; Hulda Humphrey, MD; Roseanne Reno, MD; [place], [place]; Ala Dach, MD; Fredia Beets, MD; Dimple Casey, MD [address] [place], [place] [address] [phone] M-F: 8-5, Sat: 9-4, Sun 9-12 Medicaid - yes; Tricare - yes  [facility] Little, PNP; Earlene [place], PNP [address] [place], [place] [address] [phone] M-F 8 - 5, closed 12-1 for lunch Medicaid - yes; Tricare - yes  [facility]s Kathyrn Lass, MD; Hal Neer, MD; Dimple Casey, MD; Keo, DO [address] [place], [place] [address] [phone] M-F 8- 5:30 Medicaid - yes; Tricare - yes  [facility]s - Biagio Quint, MD; Rosalia Hammers, MD; Gwenith Daily, MD [address], [place], [place] [address] [phone] Judie Petit: Nicholas Lose; Tues-Fri: 8-5; Sat: 9-12 Medicaid - yes; Tricare - yes  [facility] Pediatrics - Bobbye Morton, MD; Daphane Shepherd, MD; Chestine Spore, MD; Haskell Riling, MD; Kate Sable, MD [address] [place], [place] [address] [phone] Judie PetitMarland Kitchen Nicholas LoseFrancee Nodal: 8-5; Sat: 8:30-12:30 Medicaid - yes; Tricare - yes  [facility] - Beckey Rutter, MD; [place], [place] [address] [place], [place] [address] [phone] Mon-Fri: 8-5 Medicaid - yes;  Tricare - yes  [facility] Pediatrics - [place] Friedens, CPNP; [place], [place]; Dimple Casey, MD; Alisa Graff, MD; Cephus Shelling, MD; [address], [place], [place] [address] [phone] M-F: 8-5, closed 1-2 for lunch Medicaid - yes; Tricare - yes  [facility]s - [facility] [place], [place]; [place], [place]; Katrinka Blazing, MD; [place], CPNP; [place], [place]; [place], MD; Earlene [place], MD [address], [place], [place] [address] [phone] M-Thurs: Nicholas Lose; Fri: 8-6; Sat: 9-12; Sun 2-4 Medicaid - yes; Tricare - yes  [facility] Georgeanna Lea, MD; Evette Cristal, MD; Shea Stakes, FNP; Earney Mallet, DO; [address], [place], [place] [address] [phone] M-F: 8-5 Medicaid - yes; Tricare - yes  [facility] Pediatric Providers  [facility] Family Medicine -Collene Mares, MD; [place], NP [address], [place], [place] [address] [phone] M - Fri: 8am - 5pm, closed for lunch 12-1 Medicaid - Yes; Tricare - yes  [facility] Elinor Parkinson, MD; Victory Dakin, MD; [place], DO; Vinocur, MD;Hall, PA; Clent Ridges, [place]; Orvan Falconer, NP [phone] [address], [place], [place] [address] [phone] M-F 8:00 - 5:00, Sat 8:00 - 11:30 Medicaid - yes; Tricare - yes  [facility] Welton Flakes, MD; [place], MD, [address] MD, [place], MD, McPherson, MD; [place], NP; [place], [place];  [address] [place], [place] [address] [phone] M-F 8:10am - 5:00pm Medicaid - yes; Tricare - yes  [facility] Homer, MD; [place], NP [address] [place], [place] [address] [phone] M-F 8:00 - 5:00 Medicaid - [place] Medicaid only; Tricare - yes  [facility] Medicine - Deep [address] [place]ert[place], [place]; [place], NP [address], [place], [place] [address] [phone] M-F 8:00 - 5:00; Closed for lunch 12 - 1:00 Medicaid - yes;  Tricare - yes  [facility] Belva Crome, MD; Jonita Albee, FNP [address], [place], [place] [address] [phone] Mon 9-5; Tues/Wed 10-5; Thurs 8:30-5; Fri: 8-12:30 Medicaid - yes; Tricare - yes  [facility] Pediatric Providers  [facility]  [place], MD; [place], [place] [address], [place], [place] [address] [phone] phone [phone] fax M-F 7:15 - 4:30 Medicaid - yes; Tricare - yes  [place] Karilyn Cota, MD; [place], DO [address] [place], [place] [address] [phone] M-Fri: 8:30 - 5:00, closed for lunch everyday noon - 1pm Medicaid - Yes; Tricare - yes  [facility] Burdine, MD; Reuel Boom, MD; Dimas Aguas, MD; Neita Carp, MD; [place], [place]; Bonnita Nasuti, [place]; [place], [place]; [place], [place]; [place], [place] [address] [place], [place] [address] [phone] M-Thurs: 7:30am - 7:00pm; Friday 7:30am - 4pm; Sat: 8:00 - 1:00 Medicaid - Yes; Tricare - yes  [facility] Norval Morton, MD; Conni Elliot, MD; Carroll Kinds, MD; [place], DO [address], [place], [place] [address] [phone] M-Thur: 8:00 - 5:00, Fri: 8:00 - Noon Medicaid - yes; Tricare - yes No [place] [employer]  [place] [facility] Medicine Dettinger, MD; Nadine Counts, DO; [place], NP; Daphine Deutscher, NP; Lequita Halt, NP; Ellamae Sia, NP; Reginia Forts, NP; Darlyn Read, MD; [place], [place] [address] [place], [place] [address] [phone] M-F 8:00 - 5:00 Medicaid - yes; Tricare - yes  [facility], FNP-C; [place], FNP-C [address] [place] [place], [place] [address] [phone] M, W, R 8:00-5:00, Tues: 8:00am - 7:00pm; Fri 8:00 - noon Medicaid - Yes; Tricare - yes  [facility], MD [address] [place], [place] [address] [phone]  M-Thurs 8:30-5:30, Fri: 8:30-12:30pm Medicaid - Yes; Tricare - N  .We highly recommend childbirth education to help you plan for labor and begin  practicing coping skills (which will be needed with or without pain meds).  [place] Childbirth Education Options: Sign up by visiting [email or web address]  Childbirth ~ Self-Paced eClass (English and Spanish) This online class offers you the freedom to complete a childbirth education series in the comfort of your own home at your own pace.  Childbirth Class (In-Person 4-Week Series  or on Saturdays, Virtual 4-Week Series ~ [place]) This interactive in-person class series will help you and your partner prepare for your birth experience. Topics include: Labor & Birth, Comfort Measures, Breathing Techniques, Massage, Medical Interventions, Pain Management Options, Cesarean Birth, Postpartum Care, and Newborn Care  Comfort Techniques for Labor ~ In-Person Class [facility]) This interactive class is designed for parents-to-be who want to learn & practice hands-on skills to help relieve some of the discomfort of labor and encourage their babies to rotate toward the best position for birth. Moms and their partners will be able to try a variety of labor positions with birth balls and rebozos as well as practice breathing, relaxation, and visualization techniques.  Natural Childbirth Class (In-Person 5-Week Series, In-Person on Saturdays or Virtual 5-Week Series ~ [place]) This class series is designed for expectant parents who want to learn and  practice natural methods of coping with the process of labor and childbirth.  Cesarean Birth Self-Paced eClass (English and Spanish) This online course provides comprehensive information you can trust as you prepare for a possible cesarean birth. In this class, you'll learn how to make your birth and recovery comfortable and joyful through instructive video clips, animations, and activities.  Waterbirth ~ Airline pilot Interested in a waterbirth? In addition to a consultation with your credentialed waterbirth provider, this free, informational online  class will help you discover whether waterbirth is the right fit for you. Not all obstetrical practices offer waterbirth, so check with your healthcare provider.  Tour Probation officer) - [facility] [employer] our 4 minute video tour of [facility] located in [place].   [place] Parenting Education Options:  Pregnancy 101 (Virtual) Congratulations on your pregnancy! This class is geared toward moms in their first trimester, but everyone is welcome. We are excited to guide you through all aspects of supporting a healthy pregnancy. You will learn what to expect at routine prenatal care appointments, common postpartum adjustments, basic infant safety, and breastfeeding.  Successful Partnering & Parenting ~ In-Person Workshop [facility]) This workshop inspires and equips partners of all economic levels, ages, and cultures to confidently care for their infants, support the birthing persons, and navigate their own transformations into new partners and parents. Learning activities are geared towards supporting partner, but moms are welcome to attend.  'Baby & Me' Parenting Group (Virtual on Wednesdays at 11am) Enjoy this time discussing newborn & infant parenting topics and family adjustment issues with other new parents in a relaxed environment. Each week brings a new speaker or baby-centered activity. This group offers support and connection to parents as they journey through the adjustments and struggles of that sometimes overwhelming first year after the birth of a child.  Baby Safety, CPR, & Choking Class ~ Virtual This life-saving information is meant to encourage parents as they learn important safety and prevention tips as well as infant CPR and relief of choking.  Breastfeeding Class (In-Person in [place] or [employer]) Families learn what to expect in the first days and weeks of breastfeeding your newborn. IF YOU ARE AN EMPLOYEE  TAKING THIS CLASS FOR CREDIT, DO NOT register yourself. Please e-mail [email or web address].   Breastfeeding Self-Paced eClass (English & Spanish) Families learn what to expect in the first days and weeks of breastfeeding your newborn.  Caring for Baby ~ In-Person, Virtual or Self-Paced Class This in-person class is for both expectant and adoptive parents who want to learn and practice the most up-to-date newborn care for their babies. Focus is on birth through the first six weeks of life.  CPR & Choking Relief for Infants & Children ~ In-Person Class [facility]) This in-person course is designed for any parent, expectant parent, or adult who cares for infants or children. Participants learn and demonstrate cardiopulmonary resuscitation and choking relief procedures for both infants and children.  Grandparent Love ~ In-Person Class Grandparents will learn the most updated infant care and safety recommendations. They will discover ways to support their own children during the transition into the parenting role and receive tips on communicating with the new parents.  [place] Parenting Support Group Options:  Bereavement Grief Support Group (Pregnancy/Infant Loss) - Virtual This is an ongoing experience that meets once a month and is designed to help you honor the past, assist you in discovering tools to strengthen you today, and aid you in developing  [place] for the future.  Breastfeeding & Pumping Support Group (In-Person on Thursdays at 12pm or Virtual on Tuesdays at 5pm) Join [place] in-person each Thursday starting June 1st, 2023 at 12pm! This support group is free for all families looking for breastfeeding and/or pumping support.   Community-Based Childbirth Education Options:  [facility] Department Classes:  Childbirth education classes can help you get ready for a positive parenting experience. You can also meet other expectant parents and get free stuff for your baby.  Each class runs for five weeks on the same night and costs $45 for the mother-to-be and her support person. Medicaid covers the cost if you are eligible. Call [phone] to register.  [facility] offers a variety of programs for the [employer] and is another great way to get connected. Please go to [email or web address] for more information.  Childbirth With A Twist! Be informed of your options, get educated on birth, understand what your body is doing, learn how to cope, and have a lot of fun and laughs all while doing it either from the comfort of your couch OR in our cozy office and classroom space near the [place] airport. If you are taking a virtual class, then class is taught LIVE, so you can ask questions and receive answers in real-time from an experienced doula and childbirth educator.  This virtual childbirth education class will meet for five instruction times online.  Although we are based in [place], [place], this virtual class is open to anyone in the world. Please visit: [email or web address] for more information.  Books We Love: The Doula Guide to Childbirth by Harland German and Otila Back The First-Time Parent's Childbirth Handbook by Dr. Amie Critchley, CNM The Birth Partner by Truddie Crumble

## 2024-04-14 NOTE — Progress Notes (Signed)
   PRENATAL VISIT NOTE  Subjective:  Ellen Huffman is a 23 y.o. G4P0030 at [redacted]w[redacted]d being seen today for ongoing prenatal care.  She is currently monitored for the following issues for this low-risk pregnancy and has ADHD (attention deficit hyperactivity disorder), inattentive type; Generalized anxiety disorder; Deliberate self-cutting; MDD (major depressive disorder), recurrent severe, without psychosis (HCC); Cannabis abuse; Suicide attempt (HCC); Autonomic dysfunction; Mild headache; Anxiety state; Palpitations; Opioid abuse with opioid-induced mood disorder (HCC); Bipolar I disorder, most recent episode (or current) manic (HCC); Bicornuate uterus; and Encounter for supervision of normal pregnancy, antepartum on their problem list.  Patient reports no complaints.  Contractions: Not present.  .  Movement: Absent. Denies leaking of fluid.   The following portions of the patient's history were reviewed and updated as appropriate: allergies, current medications, past family history, past medical history, past social history, past surgical history and problem list.   Objective:   Vitals:   04/14/24 0848  BP: 122/80  Pulse: 80  Weight: 200 lb (90.7 kg)    Fetal Status:   Fundal Height: 152 cm Movement: Absent     General:  Alert, oriented and cooperative. Patient is in no acute distress.  Skin: Skin is warm and dry. No rash noted.   Cardiovascular: Normal heart rate noted  Respiratory: Normal respiratory effort, no problems with respiration noted  Abdomen: Soft, gravid, appropriate for gestational age.  Pain/Pressure: Absent     Pelvic: Cervical exam deferred        Extremities: Normal range of motion.  Edema: None  Mental Status: Normal mood and affect. Normal behavior. Normal judgment and thought content.   Assessment and Plan:  Pregnancy: G4P0030 at [redacted]w[redacted]d 1. Supervision of other normal pregnancy, antepartum (Primary) BP and FHR normal  Pap collected today    2. [redacted] weeks gestation of  pregnancy Integrated 2 today  Anatomy 5/12  Preterm labor symptoms and general obstetric precautions including but not limited to vaginal bleeding, contractions, leaking of fluid and fetal movement were reviewed in detail with the patient. Please refer to After Visit Summary for other counseling recommendations.   Return in 4 weeks for routine prenatal   Future Appointments  Date Time Provider Department Center  05/05/2024 10:45 AM [facility] 2 [facility] None  05/05/2024 11:30 AM Ferd Householder, CNM [facility]    Susi Eric, FNP

## 2024-04-16 ENCOUNTER — Encounter: Payer: Self-pay | Admitting: Advanced Practice Midwife

## 2024-04-16 ENCOUNTER — Other Ambulatory Visit: Payer: Self-pay | Admitting: *Deleted

## 2024-04-16 DIAGNOSIS — Z1379 Encounter for other screening for genetic and chromosomal anomalies: Secondary | ICD-10-CM

## 2024-04-16 DIAGNOSIS — Z3A15 15 weeks gestation of pregnancy: Secondary | ICD-10-CM

## 2024-04-16 LAB — INTEGRATED 2
AFP MoM: 1.51
Alpha-Fetoprotein: 32.9 ng/mL
Crown Rump Length: 53.8 mm
DIA Value: 141.3 pg/mL
Estriol, Unconjugated: 0.85 ng/mL
Gest. Age on Collection Date: 11.9 wk
Gestational Age: 14.9 wk
Maternal Age at EDD: 23.1 a
Nuchal Translucency (NT): 1 mm
Nuchal Translucency MoM: 0.7
Number of Fetuses: 1
PAPP-A MoM: 0.44
PAPP-A Value: 201.2 ng/mL
Sonographer ID#: 309760
Weight: 198 [lb_av]
Weight: 198 [lb_av]
hCG Value: 23.6 [IU]/mL

## 2024-04-16 LAB — CYTOLOGY - PAP
Comment: NEGATIVE
Diagnosis: NEGATIVE
High risk HPV: NEGATIVE

## 2024-04-17 LAB — CBC/D/PLT+RPR+RH+ABO+RUBIGG...
Antibody Screen: NEGATIVE
Basophils Absolute: 0 10*3/uL (ref 0.0–0.2)
Basos: 0 %
EOS (ABSOLUTE): 0.1 10*3/uL (ref 0.0–0.4)
Eos: 1 %
HCV Ab: NONREACTIVE
HIV Screen 4th Generation wRfx: NONREACTIVE
Hematocrit: 38.4 % (ref 34.0–46.6)
Hemoglobin: 13.2 g/dL (ref 11.1–15.9)
Hepatitis B Surface Ag: NEGATIVE
Immature Grans (Abs): 0 10*3/uL (ref 0.0–0.1)
Immature Granulocytes: 0 %
Lymphocytes Absolute: 1.9 10*3/uL (ref 0.7–3.1)
Lymphs: 38 %
MCH: 31.3 pg (ref 26.6–33.0)
MCHC: 34.4 g/dL (ref 31.5–35.7)
MCV: 91 fL (ref 79–97)
Monocytes Absolute: 0.3 10*3/uL (ref 0.1–0.9)
Monocytes: 5 %
Neutrophils Absolute: 2.8 10*3/uL (ref 1.4–7.0)
Neutrophils: 56 %
Platelets: 233 10*3/uL (ref 150–450)
RBC: 4.22 x10E6/uL (ref 3.77–5.28)
RDW: 11.9 % (ref 11.7–15.4)
RPR Ser Ql: NONREACTIVE
Rh Factor: POSITIVE
Rubella Antibodies, IGG: 2.09 {index} (ref >0.99)
WBC: 5.1 10*3/uL (ref 3.4–10.8)

## 2024-04-17 LAB — INTEGRATED 1
Crown Rump Length: 53.8 mm
Gest. Age on Collection Date: 11.9 wk
Maternal Age at EDD: 23.1 a
Nuchal Translucency (NT): 1 mm
Number of Fetuses: 1
PAPP-A Value: 201.2 ng/mL
Sonographer ID#: 309760
Weight: 198 [lb_av]

## 2024-04-17 LAB — HEMOGLOBIN A1C
Est. average glucose Bld gHb Est-mCnc: 88 mg/dL
Hgb A1c MFr Bld: 4.7 % — ABNORMAL LOW (ref 4.8–5.6)

## 2024-04-17 LAB — HCV INTERPRETATION

## 2024-04-18 LAB — INTEGRATED 2
AFP MoM: 1.42
Alpha-Fetoprotein: 33.3 ng/mL
Crown Rump Length: 53.8 mm
DIA MoM: 1.02
DIA Value: 152 pg/mL
Estriol, Unconjugated: 0.68 ng/mL
Gest. Age on Collection Date: 11.9 wk
Gestational Age: 15.1 wk
Maternal Age at EDD: 23.1 a
Nuchal Translucency (NT): 1 mm
Nuchal Translucency MoM: 0.7
Number of Fetuses: 1
PAPP-A MoM: 0.44
PAPP-A Value: 201.2 ng/mL
Sonographer ID#: 309760
Test Results:: NEGATIVE
Weight: 190 [lb_av]
Weight: 198 [lb_av]
hCG MoM: 0.58
hCG Value: 23.6 [IU]/mL
uE3 MoM: 0.84

## 2024-05-05 ENCOUNTER — Ambulatory Visit: Admitting: Radiology

## 2024-05-05 ENCOUNTER — Encounter: Payer: Self-pay | Admitting: Women's Health

## 2024-05-05 ENCOUNTER — Ambulatory Visit: Admitting: Women's Health

## 2024-05-05 VITALS — BP 128/88 | HR 74 | Wt 202.0 lb

## 2024-05-05 DIAGNOSIS — Z3482 Encounter for supervision of other normal pregnancy, second trimester: Secondary | ICD-10-CM

## 2024-05-05 DIAGNOSIS — Z3A18 18 weeks gestation of pregnancy: Secondary | ICD-10-CM

## 2024-05-05 DIAGNOSIS — Z363 Encounter for antenatal screening for malformations: Secondary | ICD-10-CM | POA: Diagnosis not present

## 2024-05-05 DIAGNOSIS — Z3480 Encounter for supervision of other normal pregnancy, unspecified trimester: Secondary | ICD-10-CM

## 2024-05-05 NOTE — Progress Notes (Signed)
 US : GA=18 weeks Single active female fetus, cephalic, FHR=160bpm, anterior pl, gr1, MVP = 3.3cm Can not confirm bicornuate uterus at this time. EFW26%,204g, CL = 3.5 cm, closed,nl ov's Anatomy screen complete, no apparent abn

## 2024-05-05 NOTE — Progress Notes (Signed)
 LOW-RISK PREGNANCY VISIT Patient name: Ellen Huffman MRN [id number]  Date of birth: [birth year] Chief Complaint:   Routine Prenatal Visit  History of Present Illness:   Ellen Huffman is a 23 y.o. G72P0030 female at [redacted]w[redacted]d with an Estimated Date of Delivery: 10/06/24 being seen today for ongoing management of a low-risk pregnancy.   Today she reports no complaints. Contractions: Not present. Vag. Bleeding: None.  Movement: Present. denies leaking of fluid.     03/24/2024    1:35 PM 01/18/2023   10:44 AM 12/08/2022    9:27 AM 11/14/2021    1:50 PM 08/09/2021   10:40 AM  Depression screen PHQ 2/9  Decreased Interest 1 1 0 1   Down, Depressed, Hopeless 0 1 0 1   PHQ - 2 Score 1 2 0 2   Altered sleeping 0 1 0 1   Tired, decreased energy 3 1 0 2   Change in appetite 0 1 0 2   Feeling bad or failure about yourself  0 1 0 0   Trouble concentrating 0 0 0 0   Moving slowly or fidgety/restless 0 0 0 0   Suicidal thoughts 0 0 0 0   PHQ-9 Score 4 6 0 7   Difficult doing work/chores   Not difficult at all Not difficult at all      Information is confidential and restricted. Go to Review Flowsheets to unlock data.        03/24/2024    1:35 PM 01/18/2023   10:44 AM 12/08/2022    9:28 AM 11/14/2021    1:51 PM  GAD 7 : Generalized Anxiety Score  Nervous, Anxious, on Edge 2 3 0 1  Control/stop worrying 3 3 0 1  Worry too much - different things 3 3 0 1  Trouble relaxing 1 2 0 1  Restless 0 2 0 1  Easily annoyed or irritable 2 3 2 2   Afraid - awful might happen 2 1 2 1   Total GAD 7 Score 13 17 4 8   Anxiety Difficulty   Not difficult at all Not difficult at all      Review of Systems:   Pertinent items are noted in HPI Denies abnormal vaginal discharge w/ itching/odor/irritation, headaches, visual changes, shortness of breath, chest pain, abdominal pain, severe nausea/vomiting, or problems with urination or bowel movements unless otherwise stated above. Pertinent History Reviewed:   Reviewed past medical,surgical, social, obstetrical and family history.  Reviewed problem list, medications and allergies. Physical Assessment:   Vitals:   05/05/24 1115  BP: 128/88  Pulse: 74  Weight: 202 lb (91.6 kg)  Body mass index is 32.6 kg/m.        Physical Examination:   General appearance: Well appearing, and in no distress  Mental status: Alert, oriented to person, place, and time  Skin: Warm & dry  Cardiovascular: Normal heart rate noted  Respiratory: Normal respiratory effort, no distress  Abdomen: Soft, gravid, nontender  Pelvic: Cervical exam deferred         Extremities:    Fetal Status:     Movement: Present   US : GA=18 weeks Single active female fetus, cephalic, FHR=160bpm, anterior pl, gr1, MVP = 3.3cm Can not confirm bicornuate uterus at this time. EFW26%,204g, CL = 3.5 cm, closed,nl ov's Anatomy screen complete, no apparent abn  Chaperone: N/A No results found for this or any previous visit (from the past 24 hours).  Assessment & Plan:  1) Low-risk pregnancy G4P0030 at [redacted]w[redacted]d with  an Estimated Date of Delivery: 10/06/24     Meds: No orders of the defined types were placed in this encounter.  Labs/procedures today: U/S  Plan:  Continue routine obstetrical care  Next visit: prefers in person    Reviewed: Preterm labor symptoms and general obstetric precautions including but not limited to vaginal bleeding, contractions, leaking of fluid and fetal movement were reviewed in detail with the patient.  All questions were answered. Does have home bp cuff. Office bp cuff given: not applicable. Check bp weekly, let us  know if consistently >140 and/or >90.  Follow-up: Return in about 4 weeks (around 06/02/2024) for LROB, CNM, in person.  No future appointments.  No orders of the defined types were placed in this encounter.  Ferd Householder CNM, [facility] 05/05/2024 11:39 AM

## 2024-05-05 NOTE — Patient Instructions (Signed)
 Ellen Huffman, thank you for choosing our office today! We appreciate the opportunity to meet your healthcare needs. You may receive a short survey by mail, e-mail, or through [employer]. If you are happy with your care we would appreciate if you could take just a few minutes to complete the survey questions. We read all of your comments and take your feedback very seriously. Thank you again for choosing our office.  [facility] [employer] Team at [facility] at [facility] ([address] [place], [place] [address]) Entrance C, located off of [address] Free 24/7 valet parking  Go to [email or web address] to register for FREE online childbirth classes  Call the office [phone]) or go to [facility] if: You begin to severe cramping Your water breaks.  Sometimes it is a big gush of fluid, sometimes it is just a trickle that keeps getting your panties wet or running down your legs You have vaginal bleeding.  It is normal to have a small amount of spotting if your cervix was checked.   [facility] Pediatricians/Family Doctors [facility]): [address] Meg Spina, [phone]           [facility]: [address], [phone]                [facility]): [address], [phone] (call to ask if accepting patients) [facility] Department: [address], [place], [phone]    [facility] Pediatricians/Family Doctors [facility]): [phone] [address], [phone] [facility]: [address] [place], [phone] [facility]: [address], [phone]  [facility] Doctors  [facility]): [phone] [facility]: [address], [phone]   [facility] Doctors [facility]: [address] [phone]  [facility] Doctors  [facility]: [phone], [phone]  Home Blood Pressure Monitoring for Patients   Your provider has recommended that you check your blood pressure (BP) at least once a week at home. If you do not have a blood pressure cuff at home, one will be provided for you. Contact your provider if you have not received your monitor within 1 week.   Helpful Tips for Accurate Home Blood Pressure Checks  Don't smoke, exercise, or drink caffeine 30 minutes before checking your BP Use the restroom before checking your BP (a full bladder can raise your pressure) Relax in a comfortable upright chair Feet on the ground Left arm resting comfortably on a flat surface at the level of your heart Legs uncrossed Back supported Sit quietly and don't talk Place the cuff on your bare arm Adjust snuggly, so that only two fingertips can fit between your skin and the top of the cuff Check 2 readings separated by at least one minute Keep a log of your BP readings For a visual, please reference this diagram: http://ccnc.care/bpdiagram  Provider Name: [facility]     Phone: [phone]  Zone 1: ALL CLEAR  Continue to monitor your symptoms:  BP reading is less than 140 (top number) or less than 90 (bottom number)  No right upper stomach pain No headaches or seeing spots No feeling nauseated or throwing up No swelling in face and hands  Zone 2: CAUTION Call your doctor's office for any of the following:  BP reading is greater than 140 (top number) or greater than  90 (bottom number)  Stomach pain under your ribs in the middle or right side Headaches or seeing spots Feeling nauseated or throwing up Swelling in face and hands  Zone 3: EMERGENCY  Seek immediate medical care if you have any of the following:  BP reading is greater than160 (top number) or greater than 110 (bottom number) Severe headaches not improving with Tylenol  Serious difficulty catching your breath Any worsening symptoms from  Zone 2     Second Trimester of Pregnancy The second trimester is from week 14 through week 27 (months 4 through 6). The second trimester is often a time when you feel your best. Your body has adjusted to being pregnant, and you begin to feel better physically. Usually, morning sickness has lessened or quit completely, you may have more energy, and you may have an increase in appetite. The second trimester is also a time when the fetus is growing rapidly. At the end of the sixth month, the fetus is about 9 inches long and weighs about 1 pounds. You will likely begin to feel the baby move (quickening) between 16 and 20 weeks of pregnancy. Body changes during your second trimester Your body continues to go through many changes during your second trimester. The changes vary from woman to woman. Your weight will continue to increase. You will notice your lower abdomen bulging out. You may begin to get stretch marks on your hips, abdomen, and breasts. You may develop headaches that can be relieved by medicines. The medicines should be approved by your health care provider. You may urinate more often because the fetus is pressing on your bladder. You may develop or continue to have heartburn as a result of your pregnancy. You may develop constipation because certain hormones are causing the muscles that push waste through your intestines to slow down. You may develop hemorrhoids or swollen, bulging veins (varicose veins). You may have back pain. This is caused by: Weight gain. Pregnancy hormones that are relaxing the joints in your pelvis. A shift in weight and the muscles that support your balance. Your breasts will continue to grow and they will continue to become tender. Your gums may bleed and may be sensitive to brushing and flossing. Dark spots or blotches (chloasma, mask of pregnancy) may develop on your face. This will likely fade after the baby is born. A dark line from your belly button to  the pubic area (linea nigra) may appear. This will likely fade after the baby is born. You may have changes in your hair. These can include thickening of your hair, rapid growth, and changes in texture. Some women also have hair loss during or after pregnancy, or hair that feels dry or thin. Your hair will most likely return to normal after your baby is born.  What to expect at prenatal visits During a routine prenatal visit: You will be weighed to make sure you and the fetus are growing normally. Your blood pressure will be taken. Your abdomen will be measured to track your baby's growth. The fetal heartbeat will be listened to. Any test results from the previous visit will be discussed.  Your health care provider may ask you: How you are feeling. If you are feeling the baby move. If you have had any abnormal symptoms, such as leaking fluid, bleeding, severe headaches, or abdominal cramping. If you are using any tobacco products, including cigarettes, chewing tobacco, and electronic cigarettes. If you have any questions.  Other tests that may be performed during  your second trimester include: Blood tests that check for: Low iron levels (anemia). High blood sugar that affects pregnant women (gestational diabetes) between 32 and 28 weeks. Rh antibodies. This is to check for a protein on red blood cells (Rh factor). Urine tests to check for infections, diabetes, or protein in the urine. An ultrasound to confirm the proper growth and development of the baby. An amniocentesis to check for possible genetic problems. Fetal screens for spina bifida and Down syndrome. HIV (human immunodeficiency virus) testing. Routine prenatal testing includes screening for HIV, unless you choose not to have this test.  Follow these instructions at home: Medicines Follow your health care provider's instructions regarding medicine use. Specific medicines may be either safe or unsafe to take during  pregnancy. Take a prenatal vitamin that contains at least 600 micrograms (mcg) of folic acid. If you develop constipation, try taking a stool softener if your health care provider approves. Eating and drinking Eat a balanced diet that includes fresh fruits and vegetables, whole grains, good sources of protein such as meat, eggs, or tofu, and low-fat dairy. Your health care provider will help you determine the amount of weight gain that is right for you. Avoid raw meat and uncooked cheese. These carry germs that can cause birth defects in the baby. If you have low calcium intake from food, talk to your health care provider about whether you should take a daily calcium supplement. Limit foods that are high in fat and processed sugars, such as fried and sweet foods. To prevent constipation: Drink enough fluid to keep your urine clear or pale yellow. Eat foods that are high in fiber, such as fresh fruits and vegetables, whole grains, and beans. Activity Exercise only as directed by your health care provider. Most women can continue their usual exercise routine during pregnancy. Try to exercise for 30 minutes at least 5 days a week. Stop exercising if you experience uterine contractions. Avoid heavy lifting, wear low heel shoes, and practice good posture. A sexual relationship may be continued unless your health care provider directs you otherwise. Relieving pain and discomfort Wear a good support bra to prevent discomfort from breast tenderness. Take warm sitz baths to soothe any pain or discomfort caused by hemorrhoids. Use hemorrhoid cream if your health care provider approves. Rest with your legs elevated if you have leg cramps or low back pain. If you develop varicose veins, wear support hose. Elevate your feet for 15 minutes, 3-4 times a day. Limit salt in your diet. Prenatal Care Write down your questions. Take them to your prenatal visits. Keep all your prenatal visits as told by your health  care provider. This is important. Safety Wear your seat belt at all times when driving. Make a list of emergency phone numbers, including numbers for family, friends, the hospital, and police and fire departments. General instructions Ask your health care provider for a referral to a local prenatal education class. Begin classes no later than the beginning of month 6 of your pregnancy. Ask for help if you have counseling or nutritional needs during pregnancy. Your health care provider can offer advice or refer you to specialists for help with various needs. Do not use hot tubs, steam rooms, or saunas. Do not douche or use tampons or scented sanitary pads. Do not cross your legs for long periods of time. Avoid cat litter boxes and soil used by cats. These carry germs that can cause birth defects in the baby and possibly loss of the  fetus by miscarriage or stillbirth. Avoid all smoking, herbs, alcohol, and unprescribed drugs. Chemicals in these products can affect the formation and growth of the baby. Do not use any products that contain nicotine  or tobacco, such as cigarettes and e-cigarettes. If you need help quitting, ask your health care provider. Visit your dentist if you have not gone yet during your pregnancy. Use a soft toothbrush to brush your teeth and be gentle when you floss. Contact a health care provider if: You have dizziness. You have mild pelvic cramps, pelvic pressure, or nagging pain in the abdominal area. You have persistent nausea, vomiting, or diarrhea. You have a bad smelling vaginal discharge. You have pain when you urinate. Get help right away if: You have a fever. You are leaking fluid from your vagina. You have spotting or bleeding from your vagina. You have severe abdominal cramping or pain. You have rapid weight gain or weight loss. You have shortness of breath with chest pain. You notice sudden or extreme swelling of your face, hands, ankles, feet, or legs. You  have not felt your baby move in over an hour. You have severe headaches that do not go away when you take medicine. You have vision changes. Summary The second trimester is from week 14 through week 27 (months 4 through 6). It is also a time when the fetus is growing rapidly. Your body goes through many changes during pregnancy. The changes vary from woman to woman. Avoid all smoking, herbs, alcohol, and unprescribed drugs. These chemicals affect the formation and growth your baby. Do not use any tobacco products, such as cigarettes, chewing tobacco, and e-cigarettes. If you need help quitting, ask your health care provider. Contact your health care provider if you have any questions. Keep all prenatal visits as told by your health care provider. This is important. This information is not intended to replace advice given to you by your health care provider. Make sure you discuss any questions you have with your health care provider. Document Released: 12/05/2001 Document Revised: 05/18/2016 Document Reviewed: 02/11/2013 Elsevier Interactive Patient Education  2017 [employer].

## 2024-05-07 ENCOUNTER — Encounter: Payer: Self-pay | Admitting: Women's Health

## 2024-05-23 ENCOUNTER — Inpatient Hospital Stay (HOSPITAL_COMMUNITY)
Admission: AD | Admit: 2024-05-23 | Discharge: 2024-05-24 | Disposition: A | Discharge status: Home / Self Care | Attending: Obstetrics and Gynecology | Admitting: Obstetrics and Gynecology

## 2024-05-23 DIAGNOSIS — O26899 Other specified pregnancy related conditions, unspecified trimester: Secondary | ICD-10-CM | POA: Diagnosis present

## 2024-05-23 DIAGNOSIS — R1030 Lower abdominal pain, unspecified: Secondary | ICD-10-CM | POA: Insufficient documentation

## 2024-05-23 DIAGNOSIS — Z3A00 Weeks of gestation of pregnancy not specified: Secondary | ICD-10-CM | POA: Diagnosis not present

## 2024-05-23 DIAGNOSIS — Z3480 Encounter for supervision of other normal pregnancy, unspecified trimester: Secondary | ICD-10-CM

## 2024-05-23 LAB — URINALYSIS, ROUTINE W REFLEX MICROSCOPIC
Bilirubin Urine: NEGATIVE
Glucose, UA: NEGATIVE mg/dL
Hgb urine dipstick: NEGATIVE
Ketones, ur: NEGATIVE mg/dL
Leukocytes,Ua: NEGATIVE
Nitrite: NEGATIVE
Protein, ur: NEGATIVE mg/dL
Specific Gravity, Urine: 1.011 (ref 1.005–1.030)
pH: 6 (ref 5.0–8.0)

## 2024-05-23 NOTE — MAU Provider Note (Incomplete)
 Chief Complaint:  Abdominal Pain   HPI    Ellen Huffman is a 23 y.o. G4P0030 at 103w4d who presents to maternity admissions reporting lower abdominal cramping that started at work  at [employer] today. She denies any VB, LOF, and reports fetal movements. She denies taking any medication  for her discomfort   [facility] ( P[facility]atal records reviewed)  Past Medical History:  Diagnosis Date   ADHD (attention deficit hyperactivity disorder)    Allergy    Anxiety    Bipolar disorder (HCC)    Depression    Mental disorder    Overdose 12/25/2013   Seizures (HCC) 2022   drug withdrawl   OB History  Gravida Para Term Preterm AB Living  4    3   SAB IAB Ectopic Multiple Live Births  3        # Outcome Date GA Lbr Len/2nd Weight Sex Type Anes PTL Lv  4 Current           3 SAB 05/2023          2 SAB 11/2022 [redacted]w[redacted]d    SAB     1 SAB 09/27/21 [redacted]w[redacted]d          Past Surgical History:  Procedure Laterality Date   DILATION AND CURETTAGE OF UTERUS N/A 09/27/2021   Procedure: DILATATION AND CURETTAGE WITH FROZEN WITH ULTRA SOUND;  Surgeon: Raynell Caller, MD;  Location: [facility] OR;  Service: Gynecology;  Laterality: N/A;   DILATION AND EVACUATION N/A 12/27/2022   Procedure: DILATATION AND EVACUATION WITH ANORA GENETIC TESTING;  Surgeon: Raynell Caller, MD;  Location: [facility] OR;  Service: Gynecology;  Laterality: N/A;   Family History  Problem Relation Age of Onset   Lupus Mother    Hyperlipidemia Father    Migraines Neg Hx    Parkinsonism Neg Hx    Seizures Neg Hx    ADD / ADHD Neg Hx    Bipolar disorder Neg Hx    Social History   Tobacco Use   Smoking status: Former    Types: Cigarettes    Passive exposure: Never   Smokeless tobacco: Never   Tobacco comments:    Quit Jan 2023    quit vaping with preg  Vaping Use   Vaping status: Former   Substances: Nicotine , Flavoring  Substance Use Topics   Alcohol use: Not Currently    Comment: drinks on the weekends   Drug use: Not  Currently    Types: Marijuana, Cocaine   Allergies  Allergen Reactions   Penicillins Rash   Medications Prior to Admission  Medication Sig Dispense Refill Last Dose/Taking   aspirin  EC 81 MG tablet Take 2 tablets (162 mg total) by mouth daily. 60 tablet 6    Blood Pressure Monitor MISC For regular home bp monitoring during pregnancy 1 each 0    Prenatal Vit-Fe Fumarate-FA (PNV PRENATAL PLUS MULTIVITAMIN) 27-1 MG TABS Take 1 tablet by mouth daily. 30 tablet 11     I have reviewed patient's Past Medical Hx, Surgical Hx, Family Hx, Social Hx, medications and allergies.   ROS  Pertinent items noted in HPI and remainder of comprehensive ROS otherwise negative.   PHYSICAL EXAM   Patient Vitals for the past 24 hrs:  BP Temp Temp src Pulse Resp Weight  05/23/24 2021 121/73 98.1 F (36.7 C) Oral 83 14 94.3 kg    Constitutional: Well-developed, well-nourished female in no acute distress.  Cardiovascular: normal rate & rhythm, warm and well-perfused  Respiratory: normal effort, no problems with respiration noted GI: Abd soft, non-tender, gravid MS: Extremities nontender, no edema, normal ROM Neurologic: Alert and oriented x 4.  GU: no CVA tenderness Pelvic:      Fetal Tracing: Via Doppler   Labs: Results for orders placed or performed during the hospital encounter of 05/23/24 (from the past 24 hours)  Urinalysis, Routine w reflex microscopic -Urine, Clean Catch     Status: None   Collection Time: 05/23/24  8:34 PM  Result Value Ref Range   Color, Urine YELLOW YELLOW   APPearance CLEAR CLEAR   Specific Gravity, Urine 1.011 1.005 - 1.030   pH 6.0 5.0 - 8.0   Glucose, UA NEGATIVE NEGATIVE mg/dL   Hgb urine dipstick NEGATIVE NEGATIVE   Bilirubin Urine NEGATIVE NEGATIVE   Ketones, ur NEGATIVE NEGATIVE mg/dL   Protein, ur NEGATIVE NEGATIVE mg/dL   Nitrite NEGATIVE NEGATIVE   Leukocytes,Ua NEGATIVE NEGATIVE    Imaging:  No results found.  MDM & MAU COURSE  MDM:  MAU  Course: Orders Placed This Encounter  Procedures   Urinalysis, Routine w reflex microscopic -Urine, Clean Catch   No orders of the defined types were placed in this encounter.     I have reviewed the patient chart and performed the physical exam . I have ordered & interpreted the lab results and reviewed and interpreted the *** Medications ordered as stated below.  A/P as described below.  Counseling and education provided and patient agreeable  with plan as described below. Verbalized understanding.    ASSESSMENT   1. Supervision of other normal pregnancy, antepartum     PLAN  Discharge home in stable condition with return precautions.   See AVS for full description of information given to the patient including both verbal and written. Patient verbalized understanding and agrees with the plan as described above.  ***    Allergies as of 05/23/2024       Reactions   Penicillins Rash     Med Rec must be completed prior to using this SMARTLINK***       Debbe Fail, MSN, [facility], [facility] [employer]

## 2024-05-23 NOTE — MAU Note (Signed)
 Pt says she was at work at [employer] - Ex brought package to office -  was handed a package  and  she carried it to the  office. And 15 min later - lower abd started cramping at 445pm- 9/10. No meds for pain.  Then went home- cramping was less . Then decided to come here to make sure everything was ok - 3/10.

## 2024-05-24 MED ORDER — ACETAMINOPHEN 500 MG PO TABS
1000.0000 mg | ORAL_TABLET | Freq: Once | ORAL | Status: DC
  Filled 2024-05-24: qty 2

## 2024-05-24 MED ORDER — CYCLOBENZAPRINE HCL 5 MG PO TABS
10.0000 mg | ORAL_TABLET | Freq: Once | ORAL | Status: DC
  Filled 2024-05-24: qty 2

## 2024-05-24 NOTE — MAU Note (Signed)
Not in lobby

## 2024-05-24 NOTE — Progress Notes (Signed)
 Patient left MAU prior to being seen by me for evaluation. RN called patient 3 x 's with NA. Debbe Fail, MSN, [facility], [facility] [employer]

## 2024-06-02 ENCOUNTER — Ambulatory Visit: Admitting: Advanced Practice Midwife

## 2024-06-02 ENCOUNTER — Encounter: Payer: Self-pay | Admitting: Advanced Practice Midwife

## 2024-06-02 VITALS — BP 123/84 | HR 89 | Wt 206.0 lb

## 2024-06-02 DIAGNOSIS — Z3A22 22 weeks gestation of pregnancy: Secondary | ICD-10-CM | POA: Diagnosis not present

## 2024-06-02 DIAGNOSIS — Q513 Bicornate uterus: Secondary | ICD-10-CM | POA: Diagnosis not present

## 2024-06-02 DIAGNOSIS — F411 Generalized anxiety disorder: Secondary | ICD-10-CM | POA: Diagnosis not present

## 2024-06-02 DIAGNOSIS — O99343 Other mental disorders complicating pregnancy, third trimester: Secondary | ICD-10-CM | POA: Diagnosis not present

## 2024-06-02 DIAGNOSIS — Z3480 Encounter for supervision of other normal pregnancy, unspecified trimester: Secondary | ICD-10-CM

## 2024-06-02 NOTE — Patient Instructions (Signed)
1. Before your test, do not eat or drink anything for 8-10 hours prior to your  ?appointment (a small amount of water is allowed and you may take any medicines you normally take). Be sure to drink lots of water the day before. ?2. When you arrive, your blood will be drawn for a ?fasting? blood sugar level.  ?Then you will be given a sweetened carbonated beverage to drink. You should  ?complete drinking this beverage within five minutes. After finishing the  ?beverage, you will have your blood drawn exactly 1 and 2 hours later. Having  ?your blood drawn on time is an important part of this test. A total of three blood  ?samples will be done. ?3. The test takes approximately 2 ? hours. During the test, do not have anything to  ?eat or drink. Do not smoke, chew gum (not even sugarless gum) or use breath mints.  ?4. During the test you should remain close by and seated as much as possible and  ?avoid walking around. You may want to bring a book or something else to  ?occupy your time.  ?5. After your test, you may eat and drink as normal. You may want to bring a snack  ?to eat after the test is finished. Your provider will advise you as to the results of  ?this test and any follow-up if necessary ? ?If your sugar test is positive for gestational diabetes, you will be given an phone call and further instructions discussed. If you wish to know all of your test results before your next appointment, feel free to call the office, or look up your test results on Mychart.  (The range that the lab uses for normal values of the sugar test are not necessarily the range that is used for pregnant women; if your results are within the normal range, they are definitely normal.  However, if a value is deemed "high" by the lab, it may not be too high for a pregnant woman.  We will need to discuss the results if your value(s) fall in the "high" category).   ? ? ?Tdap Vaccine ?It is recommended that you get the Tdap vaccine during the  third trimester of EACH pregnancy to help protect your baby from getting pertussis (whooping cough) ?27-36 weeks is the BEST time to do this so that you can pass the protection on to your baby. During pregnancy is better than after pregnancy, but if you are unable to get it during pregnancy it will be offered at the hospital. ?You will be offered this vaccine in the office after 27 weeks.  If you do not have health insurance, you can get the vaccine from the [facility] (no appointment needed).  ?Everyone who will be around your baby should also be up-to-date on their vaccines. Adults (who are not pregnant) only need 1 dose of Tdap during adulthood. ? ? ? ? ?

## 2024-06-02 NOTE — Progress Notes (Signed)
   LOW-RISK PREGNANCY VISIT Patient name: Ellen Huffman MRN [id number]  Date of birth: 11/08/2001 Chief Complaint:   Routine Prenatal Visit  History of Present Illness:   Ellen Huffman is a 23 y.o. G75P0030 female at [redacted]w[redacted]d with an Estimated Date of Delivery: 10/06/24 being seen today for ongoing management of a low-risk pregnancy.  Today she reports no complaints. Contractions: Not present. Vag. Bleeding: None.  Movement: Present. denies leaking of fluid. Review of Systems:   Pertinent items are noted in HPI Denies abnormal vaginal discharge w/ itching/odor/irritation, headaches, visual changes, shortness of breath, chest pain, abdominal pain, severe nausea/vomiting, or problems with urination or bowel movements unless otherwise stated above. Pertinent History Reviewed:  Reviewed past medical,surgical, social, obstetrical and family history.  Reviewed problem list, medications and allergies. Physical Assessment:   Vitals:   06/02/24 0904  BP: 123/84  Pulse: 89  Weight: 206 lb (93.4 kg)  Body mass index is 33.25 kg/m.        Physical Examination:   General appearance: Well appearing, and in no distress  Mental status: Alert, oriented to person, place, and time  Skin: Warm & dry  Cardiovascular: Normal heart rate noted  Respiratory: Normal respiratory effort, no distress  Abdomen: Soft, gravid, nontender  Pelvic: Cervical exam deferred         Extremities: Edema: None Chaperone:  N/A   Fetal Status: Fetal Heart Rate (bpm): 148   Movement: Present      No results found for this or any previous visit (from the past 24 hours).  Assessment & Plan:    Pregnancy: G4P0030 at [redacted]w[redacted]d 1. [redacted] weeks gestation of pregnancy (Primary)   2. Supervision of other normal pregnancy, antepartum  3.  Bicornuate uterus:  check EFW    Meds: No orders of the defined types were placed in this encounter.  Labs/procedures today: none  Plan:  Continue routine obstetrical care  Next visit: prefers  will be in person for PN2    Reviewed: Preterm labor symptoms and general obstetric precautions including but not limited to vaginal bleeding, contractions, leaking of fluid and fetal movement were reviewed in detail with the patient.  All questions were answered. Has home bp cuff. . Check bp weekly, let us  know if >140/90.   Follow-up: Return in about 1 month (around 07/02/2024) for PN2/LROB/ EFW.  No future appointments.  Orders Placed This Encounter  Procedures   US  OB Follow Up   Majel Scott DNP, CNM 06/02/2024 9:30 AM

## 2024-07-07 ENCOUNTER — Other Ambulatory Visit: Payer: Self-pay | Admitting: Advanced Practice Midwife

## 2024-07-07 ENCOUNTER — Ambulatory Visit

## 2024-07-07 ENCOUNTER — Ambulatory Visit (INDEPENDENT_AMBULATORY_CARE_PROVIDER_SITE_OTHER): Admitting: Women's Health

## 2024-07-07 ENCOUNTER — Encounter: Payer: Self-pay | Admitting: Women's Health

## 2024-07-07 ENCOUNTER — Other Ambulatory Visit

## 2024-07-07 VITALS — BP 128/84 | HR 80 | Wt 215.4 lb

## 2024-07-07 DIAGNOSIS — Z3A27 27 weeks gestation of pregnancy: Secondary | ICD-10-CM

## 2024-07-07 DIAGNOSIS — O365990 Maternal care for other known or suspected poor fetal growth, unspecified trimester, not applicable or unspecified: Secondary | ICD-10-CM | POA: Insufficient documentation

## 2024-07-07 DIAGNOSIS — Z3480 Encounter for supervision of other normal pregnancy, unspecified trimester: Secondary | ICD-10-CM

## 2024-07-07 DIAGNOSIS — O365920 Maternal care for other known or suspected poor fetal growth, second trimester, not applicable or unspecified: Secondary | ICD-10-CM | POA: Diagnosis not present

## 2024-07-07 DIAGNOSIS — O365921 Maternal care for other known or suspected poor fetal growth, second trimester, fetus 1: Secondary | ICD-10-CM | POA: Diagnosis not present

## 2024-07-07 DIAGNOSIS — Z23 Encounter for immunization: Secondary | ICD-10-CM | POA: Diagnosis not present

## 2024-07-07 DIAGNOSIS — Z3482 Encounter for supervision of other normal pregnancy, second trimester: Secondary | ICD-10-CM

## 2024-07-07 DIAGNOSIS — Q513 Bicornate uterus: Secondary | ICD-10-CM

## 2024-07-07 DIAGNOSIS — O0992 Supervision of high risk pregnancy, unspecified, second trimester: Secondary | ICD-10-CM

## 2024-07-07 DIAGNOSIS — Z131 Encounter for screening for diabetes mellitus: Secondary | ICD-10-CM

## 2024-07-07 HISTORY — DX: Maternal care for other known or suspected poor fetal growth, unspecified trimester, not applicable or unspecified: O36.5990

## 2024-07-07 NOTE — Patient Instructions (Signed)
 Ellen Huffman, thank you for choosing our office today! We appreciate the opportunity to meet your healthcare needs. You may receive a short survey by mail, e-mail, or through [employer]. If you are happy with your care we would appreciate if you could take just a few minutes to complete the survey questions. We read all of your comments and take your feedback very seriously. Thank you again for choosing our office.  Center for [employer] Team at [facility]  [facility] at [facility] ([address] [place], [place] [address]) Entrance C, located off of [address] Free 24/7 valet parking   CLASSES: Go to [email or web address] to register for classes (childbirth, breastfeeding, waterbirth, infant CPR, daddy bootcamp, etc.)  Call the office [phone]) or go to [facility] if: You begin to have strong, frequent contractions Your water breaks.  Sometimes it is a big gush of fluid, sometimes it is just a trickle that keeps getting your panties wet or running down your legs You have vaginal bleeding.  It is normal to have a small amount of spotting if your cervix was checked.  You don't feel your baby moving like normal.  If you don't, get you something to eat and drink and lay down and focus on feeling your baby move.   If your baby is still not moving like normal, you should call the office or go to [facility].  Call the office [phone]) or go to [facility] hospital for these signs of pre-eclampsia: Severe headache that does not go away with Tylenol  Visual changes- seeing spots, double, blurred vision Pain under your right breast or upper abdomen that does not go away with Tums or heartburn medicine Nausea and/or vomiting Severe swelling in your hands, feet, and face   Tdap Vaccine It is recommended that you get the Tdap vaccine during the third trimester of EACH pregnancy to help protect your baby from getting pertussis (whooping cough) 27-36 weeks is the BEST time to do  this so that you can pass the protection on to your baby. During pregnancy is better than after pregnancy, but if you are unable to get it during pregnancy it will be offered at the hospital.  You can get this vaccine with us , at the health department, your family doctor, or some local pharmacies Everyone who will be around your baby should also be up-to-date on their vaccines before the baby comes. Adults (who are not pregnant) only need 1 dose of Tdap during adulthood.   [facility] Pediatricians/Family Doctors [facility]): [address] Luba BROCKS, [phone]           [facility]: [address], [phone]                [facility]): [address], [phone] (call to ask if accepting patients) [facility] Department: [address], [place], [phone]    [facility] Pediatricians/Family Doctors [facility]): [phone] [address], [phone] [facility]: [address] [place], [phone] [facility]: [address], [phone]  [facility] Doctors  [facility]): [phone] [facility]: [address] [phone]   [facility] Doctors [facility]: [address], [phone]  [facility] Family Doctors  [facility]: [phone], [phone]  Home Blood Pressure Monitoring for Patients   Your provider has recommended that you check your  blood pressure (BP) at least once a week at home. If you do not have a blood pressure cuff at home, one will be provided for you. Contact your provider if you have not received your monitor within 1 week.   Helpful Tips for Accurate Home Blood Pressure Checks  Don't smoke, exercise, or drink caffeine 30 minutes before checking your BP Use the restroom before checking your BP (a full bladder can raise your  pressure) Relax in a comfortable upright chair Feet on the ground Left arm resting comfortably on a flat surface at the level of your heart Legs uncrossed Back supported Sit quietly and don't talk Place the cuff on your bare arm Adjust snuggly, so that only two fingertips can fit between your skin and the top of the cuff Check 2 readings separated by at least one minute Keep a log of your BP readings For a visual, please reference this diagram: http://ccnc.care/bpdiagram  Provider Name: [facility]     Phone: [phone]  Zone 1: ALL CLEAR  Continue to monitor your symptoms:  BP reading is less than 140 (top number) or less than 90 (bottom number)  No right upper stomach pain No headaches or seeing spots No feeling nauseated or throwing up No swelling in face and hands  Zone 2: CAUTION Call your doctor's office for any of the following:  BP reading is greater than 140 (top number) or greater than 90 (bottom number)  Stomach pain under your ribs in the middle or right side Headaches or seeing spots Feeling nauseated or throwing up Swelling in face and hands  Zone 3: EMERGENCY  Seek immediate medical care if you have any of the following:  BP reading is greater than160 (top number) or greater than 110 (bottom number) Severe headaches not improving with Tylenol  Serious difficulty catching your breath Any worsening symptoms from Zone 2   Third Trimester of Pregnancy The third trimester is from week 29 through week 42, months 7 through 9. The third trimester is a time when the fetus is growing rapidly. At the end of the ninth month, the fetus is about 20 inches in length and weighs 6-10 pounds.  BODY CHANGES Your body goes through many changes during pregnancy. The changes vary from woman to woman.  Your weight will continue to increase. You can expect to gain 25-35 pounds (11-16 kg) by the end of the pregnancy. You may begin to get stretch marks on your hips, abdomen,  and breasts. You may urinate more often because the fetus is moving lower into your pelvis and pressing on your bladder. You may develop or continue to have heartburn as a result of your pregnancy. You may develop constipation because certain hormones are causing the muscles that push waste through your intestines to slow down. You may develop hemorrhoids or swollen, bulging veins (varicose veins). You may have pelvic pain because of the weight gain and pregnancy hormones relaxing your joints between the bones in your pelvis. Backaches may result from overexertion of the muscles supporting your posture. You may have changes in your hair. These can include thickening of your hair, rapid growth, and changes in texture. Some women also have hair loss during or after pregnancy, or hair that feels dry or thin. Your hair will most likely return to normal after your baby is born. Your breasts will continue to grow and be tender. A yellow discharge may leak from your breasts called colostrum. Your belly button may stick out. You may  feel short of breath because of your expanding uterus. You may notice the fetus dropping, or moving lower in your abdomen. You may have a bloody mucus discharge. This usually occurs a few days to a week before labor begins. Your cervix becomes thin and soft (effaced) near your due date. WHAT TO EXPECT AT YOUR PRENATAL EXAMS  You will have prenatal exams every 2 weeks until week 36. Then, you will have weekly prenatal exams. During a routine prenatal visit: You will be weighed to make sure you and the fetus are growing normally. Your blood pressure is taken. Your abdomen will be measured to track your baby's growth. The fetal heartbeat will be listened to. Any test results from the previous visit will be discussed. You may have a cervical check near your due date to see if you have effaced. At around 36 weeks, your caregiver will check your cervix. At the same time, your  caregiver will also perform a test on the secretions of the vaginal tissue. This test is to determine if a type of bacteria, Group B streptococcus, is present. Your caregiver will explain this further. Your caregiver may ask you: What your birth plan is. How you are feeling. If you are feeling the baby move. If you have had any abnormal symptoms, such as leaking fluid, bleeding, severe headaches, or abdominal cramping. If you have any questions. Other tests or screenings that may be performed during your third trimester include: Blood tests that check for low iron levels (anemia). Fetal testing to check the health, activity level, and growth of the fetus. Testing is done if you have certain medical conditions or if there are problems during the pregnancy. FALSE LABOR You may feel small, irregular contractions that eventually go away. These are called Braxton Hicks contractions, or false labor. Contractions may last for hours, days, or even weeks before true labor sets in. If contractions come at regular intervals, intensify, or become painful, it is best to be seen by your caregiver.  SIGNS OF LABOR  Menstrual-like cramps. Contractions that are 5 minutes apart or less. Contractions that start on the top of the uterus and spread down to the lower abdomen and back. A sense of increased pelvic pressure or back pain. A watery or bloody mucus discharge that comes from the vagina. If you have any of these signs before the 37th week of pregnancy, call your caregiver right away. You need to go to the hospital to get checked immediately. HOME CARE INSTRUCTIONS  Avoid all smoking, herbs, alcohol, and unprescribed drugs. These chemicals affect the formation and growth of the baby. Follow your caregiver's instructions regarding medicine use. There are medicines that are either safe or unsafe to take during pregnancy. Exercise only as directed by your caregiver. Experiencing uterine cramps is a good sign to  stop exercising. Continue to eat regular, healthy meals. Wear a good support bra for breast tenderness. Do not use hot tubs, steam rooms, or saunas. Wear your seat belt at all times when driving. Avoid raw meat, uncooked cheese, cat litter boxes, and soil used by cats. These carry germs that can cause birth defects in the baby. Take your prenatal vitamins. Try taking a stool softener (if your caregiver approves) if you develop constipation. Eat more high-fiber foods, such as fresh vegetables or fruit and whole grains. Drink plenty of fluids to keep your urine clear or pale yellow. Take warm sitz baths to soothe any pain or discomfort caused by hemorrhoids. Use hemorrhoid cream if  your caregiver approves. If you develop varicose veins, wear support hose. Elevate your feet for 15 minutes, 3-4 times a day. Limit salt in your diet. Avoid heavy lifting, wear low heal shoes, and practice good posture. Rest a lot with your legs elevated if you have leg cramps or low back pain. Visit your dentist if you have not gone during your pregnancy. Use a soft toothbrush to brush your teeth and be gentle when you floss. A sexual relationship may be continued unless your caregiver directs you otherwise. Do not travel far distances unless it is absolutely necessary and only with the approval of your caregiver. Take prenatal classes to understand, practice, and ask questions about the labor and delivery. Make a trial run to the hospital. Pack your hospital bag. Prepare the baby's nursery. Continue to go to all your prenatal visits as directed by your caregiver. SEEK MEDICAL CARE IF: You are unsure if you are in labor or if your water has broken. You have dizziness. You have mild pelvic cramps, pelvic pressure, or nagging pain in your abdominal area. You have persistent nausea, vomiting, or diarrhea. You have a bad smelling vaginal discharge. You have pain with urination. SEEK IMMEDIATE MEDICAL CARE IF:  You  have a fever. You are leaking fluid from your vagina. You have spotting or bleeding from your vagina. You have severe abdominal cramping or pain. You have rapid weight loss or gain. You have shortness of breath with chest pain. You notice sudden or extreme swelling of your face, hands, ankles, feet, or legs. You have not felt your baby move in over an hour. You have severe headaches that do not go away with medicine. You have vision changes. Document Released: 12/05/2001 Document Revised: 12/16/2013 Document Reviewed: 02/11/2013 [facility] Patient Information 2015 [place], [place]. This information is not intended to replace advice given to you by your health care provider. Make sure you discuss any questions you have with your health care provider.      Fetal Growth Restriction: What to Know  Fetal growth restriction (FGR) is when a baby is not growing normally during pregnancy. A baby with FGR is smaller than they should be and may weigh less than normal at birth. Babies with FGR are at higher risk for early delivery. They may also need more care than usual after birth. What are the causes? Problems with the placenta or umbilical cord. This problem causes the fetus to get less oxygen or nutrition. Not enough food for the mother during pregnancy. Not enough weight gain for the mother during pregnancy. Smoking, drinking alcohol, and taking drugs. Some medicines. Problems that a baby may get before they're born (congenital). Problems that are passed in families (genetic). Being pregnant with twins or more. Infection. What increases the risk? You're older than age 65 or younger than age 37. You have certain medical conditions, such as: High blood pressure. Preeclampsia. Diabetes. Heart or kidney disease. Anemia. You live at a very high altitude. You have a history of FGR, or have a genetic disorder. You have a family history of FGR, or genetic disorders. You had treatments to  help you get pregnant. What are the signs or symptoms? FGR does not cause many symptoms. Your health care provider may suspect FGR if your belly is not as big as it should be for the stage of your pregnancy. How is this diagnosed? FGR is diagnosed with physical exams and prenatal exams. Your provider may: Measure your uterus. Measure your baby using ultrasound. Do  a test to check the fluid around the baby in the uterus. This will show any infection or problems that can develop before the baby is born. Do tests to check blood flow to the placenta and to the baby. How is this treated? Your provider will treat the cause of FGR. Your pregnancy will be closely watched. If your baby is not getting enough blood because of a problem with the placenta, your baby may need to be delivered early. Follow these instructions at home: Medicines Take your medicines only as told. This includes vitamins and supplements. Ask your provider before you take or use any medicines, supplements, vitamins, eye drops, and creams. General instructions Eat a healthy diet. Work with your health care provider or a dietitian to make sure that: You are getting enough nutrients. You are gaining enough weight during your pregnancy. Rest as needed. Try to get at least 8 hours of sleep every night. Do not drink alcohol or use drugs. Do not smoke, vape, or use nicotine  or tobacco. Keep all follow-up visits. Your provider needs to check your health and your baby's growth. Contact a health care provider if: Your baby is moving less than usual. You have cramps in your belly or have pain in your hips or lower back. You have a sudden, sharp pain in the belly. You have a gush or trickle of fluid from your vagina and you cannot stop it. You have signs of infection, including a fever. You have bleeding from your vagina. You have symptoms of high blood pressure or preeclampsia. These include: A headache that doesn't go away when you  take medicine. Sudden or extreme swelling of your face, hands, legs, or feet. Vision problems, such as: You see spots. You have blurry vision. You're sensitive to light. Get help right away if: You faint, have a seizure, or cannot think clearly. You have chest pain or trouble breathing. You have any of these symptoms and you're unable to reach your provider: Symptoms of infection, including a fever. You have bleeding from your vagina. You have symptoms of high blood pressure or preeclampsia. These symptoms may be an emergency. Call 911 right away. Do not wait to see if the symptoms will go away. Do not drive yourself to the hospital. This information is not intended to replace advice given to you by your health care provider. Make sure you discuss any questions you have with your health care provider. Document Revised: 05/14/2023 Document Reviewed: 05/14/2023 Elsevier Patient Education  2024 [employer].

## 2024-07-07 NOTE — Progress Notes (Signed)
 US  27 wks,cephalic,anterior placenta gr 1,normal ovaries,CX 3.1 cm,AFI 19 cm,FHR 157 bpm,RI .69,.68,.69,.66=53%,EFW 891 g 12%,AC 9.5%

## 2024-07-07 NOTE — Progress Notes (Signed)
 HIGH-RISK PREGNANCY VISIT Patient name: Ellen Huffman MRN [id number]  Date of birth: 13-Jan-2001 Chief Complaint:   Routine Prenatal Visit  History of Present Illness:   Ellen Huffman is a 23 y.o. G41P0030 female at [redacted]w[redacted]d with an Estimated Date of Delivery: 10/06/24 being seen today for ongoing management of a high-risk pregnancy complicated by fetal growth restriction AC 9.5% (EFW 12%) w/ normal UAD.    Today she reports no complaints. Contractions: Irritability. Vag. Bleeding: None.  Movement: Present. denies leaking of fluid.      03/24/2024    1:35 PM 01/18/2023   10:44 AM 12/08/2022    9:27 AM 11/14/2021    1:50 PM 08/09/2021   10:40 AM  Depression screen PHQ 2/9  Decreased Interest 1 1 0 1   Down, Depressed, Hopeless 0 1 0 1   PHQ - 2 Score 1 2 0 2   Altered sleeping 0 1 0 1   Tired, decreased energy 3 1 0 2   Change in appetite 0 1 0 2   Feeling bad or failure about yourself  0 1 0 0   Trouble concentrating 0 0 0 0   Moving slowly or fidgety/restless 0 0 0 0   Suicidal thoughts 0 0 0 0   PHQ-9 Score 4 6 0 7   Difficult doing work/chores   Not difficult at all Not difficult at all      Information is confidential and restricted. Go to Review Flowsheets to unlock data.        03/24/2024    1:35 PM 01/18/2023   10:44 AM 12/08/2022    9:28 AM 11/14/2021    1:51 PM  GAD 7 : Generalized Anxiety Score  Nervous, Anxious, on Edge 2 3 0 1  Control/stop worrying 3 3 0 1  Worry too much - different things 3 3 0 1  Trouble relaxing 1 2 0 1  Restless 0 2 0 1  Easily annoyed or irritable 2 3 2 2   Afraid - awful might happen 2 1 2 1   Total GAD 7 Score 13 17 4 8   Anxiety Difficulty   Not difficult at all Not difficult at all     Review of Systems:   Pertinent items are noted in HPI Denies abnormal vaginal discharge w/ itching/odor/irritation, headaches, visual changes, shortness of breath, chest pain, abdominal pain, severe nausea/vomiting, or problems with urination or bowel  movements unless otherwise stated above. Pertinent History Reviewed:  Reviewed past medical,surgical, social, obstetrical and family history.  Reviewed problem list, medications and allergies. Physical Assessment:   Vitals:   07/07/24 1006  BP: 128/84  Pulse: 80  Weight: 215 lb 6.4 oz (97.7 kg)  Body mass index is 34.77 kg/m.           Physical Examination:   General appearance: alert, well appearing, and in no distress  Mental status: alert, oriented to person, place, and time  Skin: warm & dry   Extremities:      Cardiovascular: normal heart rate noted  Respiratory: normal respiratory effort, no distress  Abdomen: gravid, soft, non-tender  Pelvic: Cervical exam deferred         Fetal Status:     Movement: Present    Fetal Surveillance Testing today: US  27 wks,cephalic,anterior placenta gr 1,normal ovaries,CX 3.1 cm,AFI 19 cm,FHR 157 bpm,RI .69,.68,.69,.66=53%,EFW 891 g 12%,AC 9.5%   Chaperone: N/A  No results found for this or any previous visit (from the past 24 hours).  Assessment &  Plan:  High-risk pregnancy: G4P0030 at [redacted]w[redacted]d with an Estimated Date of Delivery: 10/06/24   1) FGR AC 9.5%, EFW 12%, normal UAD, discussed, start testing   2) Bicornuate uterus  Meds: No orders of the defined types were placed in this encounter.   Labs/procedures today: Tdap, U/S, and PN2  Treatment Plan:    UAD EFW Testing Delivery  EFW/AC 3-9% Q 1-2wk Q 3wk 28w-weekly BPP 32w-add NST 38.0-39.0     Reviewed: Preterm labor symptoms and general obstetric precautions including but not limited to vaginal bleeding, contractions, leaking of fluid and fetal movement were reviewed in detail with the patient.  All questions were answered. Does have home bp cuff. Office bp cuff given: not applicable. Check bp weekly, let us  know if consistently >140 and/or >90.  Follow-up: Return for start weekly BPP/Dopp HROB MD/CNM; add nurse/nst weekly @ 32w.   No future appointments.  Orders Placed  This Encounter  Procedures   US  FETAL BPP WO NON STRESS   US  UA Cord Doppler   US  OB Follow Up   Suzen JONELLE Fetters CNM, [facility] 07/07/2024 10:32 AM

## 2024-07-07 NOTE — Addendum Note (Signed)
 Addended by: Aalayah Riles I on: 07/07/2024 10:38 AM   Modules accepted: Orders

## 2024-07-08 ENCOUNTER — Ambulatory Visit: Payer: Self-pay | Admitting: Women's Health

## 2024-07-08 DIAGNOSIS — O0990 Supervision of high risk pregnancy, unspecified, unspecified trimester: Secondary | ICD-10-CM

## 2024-07-08 LAB — GLUCOSE TOLERANCE, 2 HOURS W/ 1HR
Glucose, 1 hour: 153 mg/dL (ref 70–179)
Glucose, 2 hour: 125 mg/dL (ref 70–152)
Glucose, Fasting: 79 mg/dL (ref 70–91)

## 2024-07-08 LAB — CBC
Hematocrit: 40.5 % (ref 34.0–46.6)
Hemoglobin: 13.1 g/dL (ref 11.1–15.9)
MCH: 31 pg (ref 26.6–33.0)
MCHC: 32.3 g/dL (ref 31.5–35.7)
MCV: 96 fL (ref 79–97)
Platelets: 213 x10E3/uL (ref 150–450)
RBC: 4.22 x10E6/uL (ref 3.77–5.28)
RDW: 13.5 % (ref 11.7–15.4)
WBC: 8.4 x10E3/uL (ref 3.4–10.8)

## 2024-07-08 LAB — RPR: RPR Ser Ql: NONREACTIVE

## 2024-07-08 LAB — ANTIBODY SCREEN: Antibody Screen: NEGATIVE

## 2024-07-08 LAB — HIV ANTIBODY (ROUTINE TESTING W REFLEX): HIV Screen 4th Generation wRfx: NONREACTIVE

## 2024-07-17 ENCOUNTER — Encounter: Payer: Self-pay | Admitting: Women's Health

## 2024-07-17 ENCOUNTER — Ambulatory Visit

## 2024-07-17 ENCOUNTER — Ambulatory Visit: Admitting: Women's Health

## 2024-07-17 VITALS — BP 129/81 | HR 82 | Wt 219.0 lb

## 2024-07-17 DIAGNOSIS — Z3A28 28 weeks gestation of pregnancy: Secondary | ICD-10-CM

## 2024-07-17 DIAGNOSIS — O365920 Maternal care for other known or suspected poor fetal growth, second trimester, not applicable or unspecified: Secondary | ICD-10-CM

## 2024-07-17 DIAGNOSIS — O365930 Maternal care for other known or suspected poor fetal growth, third trimester, not applicable or unspecified: Secondary | ICD-10-CM

## 2024-07-17 DIAGNOSIS — O0993 Supervision of high risk pregnancy, unspecified, third trimester: Secondary | ICD-10-CM | POA: Diagnosis not present

## 2024-07-17 DIAGNOSIS — O0992 Supervision of high risk pregnancy, unspecified, second trimester: Secondary | ICD-10-CM

## 2024-07-17 DIAGNOSIS — O0990 Supervision of high risk pregnancy, unspecified, unspecified trimester: Secondary | ICD-10-CM

## 2024-07-17 NOTE — Progress Notes (Signed)
 US  28+3 wks,transverse head left,FHR 142 bpm,BPP 8/8,anterior placenta gr 3,CX 3.4 cm,AFI 18 CM,RI .66,.67,.72=65%

## 2024-07-17 NOTE — Progress Notes (Signed)
 HIGH-RISK PREGNANCY VISIT Patient name: Ellen Huffman MRN [id number]  Date of birth: 07/17/2001 Chief Complaint:   Routine Prenatal Visit  History of Present Illness:   Ellen Huffman is a 23 y.o. G28P0030 female at [redacted]w[redacted]d with an Estimated Date of Delivery: 10/06/24 being seen today for ongoing management of a high-risk pregnancy complicated by fetal growth restriction AC 9.5% (EFW 12%) w/ normal UAD.    Today she reports no complaints. Contractions: Not present. Vag. Bleeding: None.  Movement: Present. denies leaking of fluid.      03/24/2024    1:35 PM 01/18/2023   10:44 AM 12/08/2022    9:27 AM 11/14/2021    1:50 PM 08/09/2021   10:40 AM  Depression screen PHQ 2/9  Decreased Interest 1 1 0 1   Down, Depressed, Hopeless 0 1 0 1   PHQ - 2 Score 1 2 0 2   Altered sleeping 0 1 0 1   Tired, decreased energy 3 1 0 2   Change in appetite 0 1 0 2   Feeling bad or failure about yourself  0 1 0 0   Trouble concentrating 0 0 0 0   Moving slowly or fidgety/restless 0 0 0 0   Suicidal thoughts 0 0 0 0   PHQ-9 Score 4 6 0 7   Difficult doing work/chores   Not difficult at all Not difficult at all      Information is confidential and restricted. Go to Review Flowsheets to unlock data.        03/24/2024    1:35 PM 01/18/2023   10:44 AM 12/08/2022    9:28 AM 11/14/2021    1:51 PM  GAD 7 : Generalized Anxiety Score  Nervous, Anxious, on Edge 2 3 0 1  Control/stop worrying 3 3 0 1  Worry too much - different things 3 3 0 1  Trouble relaxing 1 2 0 1  Restless 0 2 0 1  Easily annoyed or irritable 2 3 2 2   Afraid - awful might happen 2 1 2 1   Total GAD 7 Score 13 17 4 8   Anxiety Difficulty   Not difficult at all Not difficult at all     Review of Systems:   Pertinent items are noted in HPI Denies abnormal vaginal discharge w/ itching/odor/irritation, headaches, visual changes, shortness of breath, chest pain, abdominal pain, severe nausea/vomiting, or problems with urination or bowel  movements unless otherwise stated above. Pertinent History Reviewed:  Reviewed past medical,surgical, social, obstetrical and family history.  Reviewed problem list, medications and allergies. Physical Assessment:   Vitals:   07/17/24 0854  BP: 129/81  Pulse: 82  Weight: 219 lb (99.3 kg)  Body mass index is 35.35 kg/m.           Physical Examination:   General appearance: alert, well appearing, and in no distress  Mental status: alert, oriented to person, place, and time  Skin: warm & dry   Extremities:      Cardiovascular: normal heart rate noted  Respiratory: normal respiratory effort, no distress  Abdomen: gravid, soft, non-tender  Pelvic: Cervical exam deferred         Fetal Status:     Movement: Present    Fetal Surveillance Testing today: US  28+3 wks,transverse head left,FHR 142 bpm,BPP 8/8,anterior placenta gr 3,CX 3.4 cm,AFI 18 CM,RI .66,.67,.72=65%   Chaperone: N/A  No results found for this or any previous visit (from the past 24 hours).  Assessment & Plan:  High-risk  pregnancy: G4P0030 at [redacted]w[redacted]d with an Estimated Date of Delivery: 10/06/24   1) FGR, AC 9.5% (EFW 12%) w/ normal UAD  2) Bicornuate uterus  Meds: No orders of the defined types were placed in this encounter.  Labs/procedures today: U/S  Treatment Plan:    UAD EFW Testing Delivery  EFW/AC 3-9% Q 1-2wk Q 3wk 28w-weekly BPP 32w-add NST 38.0-39.0    Reviewed: Preterm labor symptoms and general obstetric precautions including but not limited to vaginal bleeding, contractions, leaking of fluid and fetal movement were reviewed in detail with the patient.  All questions were answered. Does have home bp cuff. Office bp cuff given: not applicable. Check bp weekly, let us  know if consistently >140 and/or >90.  Follow-up: Return for As scheduled.   Future Appointments  Date Time Provider Department Center  07/17/2024  9:30 AM Kizzie Suzen SAUNDERS, CNM [facility]  07/24/2024  9:15 AM [facility] US   [facility] None  07/24/2024 10:10 AM Newton Mering, CNM [facility]  08/11/2024 10:50 AM [facility] NURSE [facility]  08/14/2024 10:00 AM [facility] US  [facility] None  08/14/2024 10:50 AM Kizzie Suzen SAUNDERS, CNM [facility]  08/18/2024  2:30 PM [facility] NURSE [facility]  08/21/2024  2:15 PM [facility] US  [facility] None  08/21/2024  3:10 PM Kizzie Suzen SAUNDERS, CNM [facility]  08/28/2024 10:45 AM [facility] US  [facility] None  08/28/2024 11:30 AM Kizzie Suzen SAUNDERS, CNM [facility]  09/01/2024 10:10 AM [facility] NURSE [facility]  09/04/2024  1:30 PM [facility] US  [facility] None  09/04/2024  2:30 PM Marilynn Nest, DO [facility]  09/08/2024 10:10 AM [facility] NURSE [facility]  09/11/2024  3:00 PM [facility] US  [facility] None  09/11/2024  3:50 PM Kizzie Suzen SAUNDERS, CNM [facility]  09/15/2024 10:50 AM [facility] NURSE [facility]  09/18/2024  3:00 PM [facility] US  [facility] None  09/18/2024  3:50 PM Marilynn Nest, DO [facility]  09/22/2024 10:50 AM [facility] NURSE [facility]  09/29/2024 11:10 AM [facility] N[facility]  10/02/2024 10:45 AM [facility] US  [facility] None  10/06/2024 10:50 AM [facility] N[facility]SE [facility]    No orders of the defined types were placed in this encounter.  Suzen SAUNDERS Kizzie CNM, [facility] 07/17/2024 9:27 AM

## 2024-07-17 NOTE — Patient Instructions (Signed)
 Ellen Huffman, thank you for choosing our office today! We appreciate the opportunity to meet your healthcare needs. You may receive a short survey by mail, e-mail, or through [employer]. If you are happy with your care we would appreciate if you could take just a few minutes to complete the survey questions. We read all of your comments and take your feedback very seriously. Thank you again for choosing our office.  Center for [employer] Team at [facility]  [facility] at [facility] ([address] [place], [place] [address]) Entrance C, located off of [address] Free 24/7 valet parking   CLASSES: Go to [email or web address] to register for classes (childbirth, breastfeeding, waterbirth, infant CPR, daddy bootcamp, etc.)  Call the office [phone]) or go to [facility] if: You begin to have strong, frequent contractions Your water breaks.  Sometimes it is a big gush of fluid, sometimes it is just a trickle that keeps getting your panties wet or running down your legs You have vaginal bleeding.  It is normal to have a small amount of spotting if your cervix was checked.  You don't feel your baby moving like normal.  If you don't, get you something to eat and drink and lay down and focus on feeling your baby move.   If your baby is still not moving like normal, you should call the office or go to [facility].  Call the office [phone]) or go to [facility] hospital for these signs of pre-eclampsia: Severe headache that does not go away with Tylenol  Visual changes- seeing spots, double, blurred vision Pain under your right breast or upper abdomen that does not go away with Tums or heartburn medicine Nausea and/or vomiting Severe swelling in your hands, feet, and face   Tdap Vaccine It is recommended that you get the Tdap vaccine during the third trimester of EACH pregnancy to help protect your baby from getting pertussis (whooping cough) 27-36 weeks is the BEST time to do  this so that you can pass the protection on to your baby. During pregnancy is better than after pregnancy, but if you are unable to get it during pregnancy it will be offered at the hospital.  You can get this vaccine with us , at the health department, your family doctor, or some local pharmacies Everyone who will be around your baby should also be up-to-date on their vaccines before the baby comes. Adults (who are not pregnant) only need 1 dose of Tdap during adulthood.   [facility] Pediatricians/Family Doctors [facility]): [address] Luba Huffman, [phone]           [facility]: [address], [phone]                [facility] Medicine [facility]): [address], [phone] (call to ask if accepting patients) [facility] Department: [address], [place], [phone]    [facility] Pediatricians/Family Doctors [facility]): [phone], [phone] [facility]: [address] [place], [phone] [facility] [place]: [address], [phone]  [facility] Doctors  [facility]): [phone] [facility]: [address], [phone]   [facility] Doctors [facility]: [address] [phone]  [facility] Family Doctors  [facility]: [phone], [phone]  Home Blood Pressure Monitoring for Patients   Your provider has recommended that you check your  blood pressure (BP) at least once a week at home. If you do not have a blood pressure cuff at home, one will be provided for you. Contact your provider if you have not received your monitor within 1 week.   Helpful Tips for Accurate Home Blood Pressure Checks  Don't smoke, exercise, or drink caffeine 30 minutes before checking your BP Use the restroom before checking your BP (a full bladder can raise your  pressure) Relax in a comfortable upright chair Feet on the ground Left arm resting comfortably on a flat surface at the level of your heart Legs uncrossed Back supported Sit quietly and don't talk Place the cuff on your bare arm Adjust snuggly, so that only two fingertips can fit between your skin and the top of the cuff Check 2 readings separated by at least one minute Keep a log of your BP readings For a visual, please reference this diagram: http://ccnc.care/bpdiagram  Provider Name: [facility]     Phone: [phone]  Zone 1: ALL CLEAR  Continue to monitor your symptoms:  BP reading is less than 140 (top number) or less than 90 (bottom number)  No right upper stomach pain No headaches or seeing spots No feeling nauseated or throwing up No swelling in face and hands  Zone 2: CAUTION Call your doctor's office for any of the following:  BP reading is greater than 140 (top number) or greater than 90 (bottom number)  Stomach pain under your ribs in the middle or right side Headaches or seeing spots Feeling nauseated or throwing up Swelling in face and hands  Zone 3: EMERGENCY  Seek immediate medical care if you have any of the following:  BP reading is greater than160 (top number) or greater than 110 (bottom number) Severe headaches not improving with Tylenol  Serious difficulty catching your breath Any worsening symptoms from Zone 2   Third Trimester of Pregnancy The third trimester is from week 29 through week 42, months 7 through 9. The third trimester is a time when the fetus is growing rapidly. At the end of the ninth month, the fetus is about 20 inches in length and weighs 6-10 pounds.  BODY CHANGES Your body goes through many changes during pregnancy. The changes vary from woman to woman.  Your weight will continue to increase. You can expect to gain 25-35 pounds (11-16 kg) by the end of the pregnancy. You may begin to get stretch marks on your hips, abdomen,  and breasts. You may urinate more often because the fetus is moving lower into your pelvis and pressing on your bladder. You may develop or continue to have heartburn as a result of your pregnancy. You may develop constipation because certain hormones are causing the muscles that push waste through your intestines to slow down. You may develop hemorrhoids or swollen, bulging veins (varicose veins). You may have pelvic pain because of the weight gain and pregnancy hormones relaxing your joints between the bones in your pelvis. Backaches may result from overexertion of the muscles supporting your posture. You may have changes in your hair. These can include thickening of your hair, rapid growth, and changes in texture. Some women also have hair loss during or after pregnancy, or hair that feels dry or thin. Your hair will most likely return to normal after your baby is born. Your breasts will continue to grow and be tender. A yellow discharge may leak from your breasts called colostrum. Your belly button may stick out. You may  feel short of breath because of your expanding uterus. You may notice the fetus dropping, or moving lower in your abdomen. You may have a bloody mucus discharge. This usually occurs a few days to a week before labor begins. Your cervix becomes thin and soft (effaced) near your due date. WHAT TO EXPECT AT YOUR PRENATAL EXAMS  You will have prenatal exams every 2 weeks until week 36. Then, you will have weekly prenatal exams. During a routine prenatal visit: You will be weighed to make sure you and the fetus are growing normally. Your blood pressure is taken. Your abdomen will be measured to track your baby's growth. The fetal heartbeat will be listened to. Any test results from the previous visit will be discussed. You may have a cervical check near your due date to see if you have effaced. At around 36 weeks, your caregiver will check your cervix. At the same time, your  caregiver will also perform a test on the secretions of the vaginal tissue. This test is to determine if a type of bacteria, Group B streptococcus, is present. Your caregiver will explain this further. Your caregiver may ask you: What your birth plan is. How you are feeling. If you are feeling the baby move. If you have had any abnormal symptoms, such as leaking fluid, bleeding, severe headaches, or abdominal cramping. If you have any questions. Other tests or screenings that may be performed during your third trimester include: Blood tests that check for low iron levels (anemia). Fetal testing to check the health, activity level, and growth of the fetus. Testing is done if you have certain medical conditions or if there are problems during the pregnancy. FALSE LABOR You may feel small, irregular contractions that eventually go away. These are called Braxton Hicks contractions, or false labor. Contractions may last for hours, days, or even weeks before true labor sets in. If contractions come at regular intervals, intensify, or become painful, it is best to be seen by your caregiver.  SIGNS OF LABOR  Menstrual-like cramps. Contractions that are 5 minutes apart or less. Contractions that start on the top of the uterus and spread down to the lower abdomen and back. A sense of increased pelvic pressure or back pain. A watery or bloody mucus discharge that comes from the vagina. If you have any of these signs before the 37th week of pregnancy, call your caregiver right away. You need to go to the hospital to get checked immediately. HOME CARE INSTRUCTIONS  Avoid all smoking, herbs, alcohol, and unprescribed drugs. These chemicals affect the formation and growth of the baby. Follow your caregiver's instructions regarding medicine use. There are medicines that are either safe or unsafe to take during pregnancy. Exercise only as directed by your caregiver. Experiencing uterine cramps is a good sign to  stop exercising. Continue to eat regular, healthy meals. Wear a good support bra for breast tenderness. Do not use hot tubs, steam rooms, or saunas. Wear your seat belt at all times when driving. Avoid raw meat, uncooked cheese, cat litter boxes, and soil used by cats. These carry germs that can cause birth defects in the baby. Take your prenatal vitamins. Try taking a stool softener (if your caregiver approves) if you develop constipation. Eat more high-fiber foods, such as fresh vegetables or fruit and whole grains. Drink plenty of fluids to keep your urine clear or pale yellow. Take warm sitz baths to soothe any pain or discomfort caused by hemorrhoids. Use hemorrhoid cream if  your caregiver approves. If you develop varicose veins, wear support hose. Elevate your feet for 15 minutes, 3-4 times a day. Limit salt in your diet. Avoid heavy lifting, wear low heal shoes, and practice good posture. Rest a lot with your legs elevated if you have leg cramps or low back pain. Visit your dentist if you have not gone during your pregnancy. Use a soft toothbrush to brush your teeth and be gentle when you floss. A sexual relationship may be continued unless your caregiver directs you otherwise. Do not travel far distances unless it is absolutely necessary and only with the approval of your caregiver. Take prenatal classes to understand, practice, and ask questions about the labor and delivery. Make a trial run to the hospital. Pack your hospital bag. Prepare the baby's nursery. Continue to go to all your prenatal visits as directed by your caregiver. SEEK MEDICAL CARE IF: You are unsure if you are in labor or if your water has broken. You have dizziness. You have mild pelvic cramps, pelvic pressure, or nagging pain in your abdominal area. You have persistent nausea, vomiting, or diarrhea. You have a bad smelling vaginal discharge. You have pain with urination. SEEK IMMEDIATE MEDICAL CARE IF:  You  have a fever. You are leaking fluid from your vagina. You have spotting or bleeding from your vagina. You have severe abdominal cramping or pain. You have rapid weight loss or gain. You have shortness of breath with chest pain. You notice sudden or extreme swelling of your face, hands, ankles, feet, or legs. You have not felt your baby move in over an hour. You have severe headaches that do not go away with medicine. You have vision changes. Document Released: 12/05/2001 Document Revised: 12/16/2013 Document Reviewed: 02/11/2013 [facility] Patient Information 2015 [place], [place]. This information is not intended to replace advice given to you by your health care provider. Make sure you discuss any questions you have with your health care provider.

## 2024-07-20 ENCOUNTER — Other Ambulatory Visit: Payer: Self-pay | Admitting: Advanced Practice Midwife

## 2024-07-24 ENCOUNTER — Ambulatory Visit

## 2024-07-24 ENCOUNTER — Other Ambulatory Visit (HOSPITAL_COMMUNITY)
Admission: RE | Admit: 2024-07-24 | Discharge: 2024-07-24 | Disposition: A | Discharge status: Home / Self Care | Source: Ambulatory Visit | Attending: Advanced Practice Midwife | Admitting: Advanced Practice Midwife

## 2024-07-24 ENCOUNTER — Encounter: Payer: Self-pay | Admitting: Advanced Practice Midwife

## 2024-07-24 ENCOUNTER — Ambulatory Visit: Admitting: Advanced Practice Midwife

## 2024-07-24 VITALS — BP 124/82 | HR 83 | Wt 218.0 lb

## 2024-07-24 DIAGNOSIS — O365920 Maternal care for other known or suspected poor fetal growth, second trimester, not applicable or unspecified: Secondary | ICD-10-CM

## 2024-07-24 DIAGNOSIS — O26893 Other specified pregnancy related conditions, third trimester: Secondary | ICD-10-CM

## 2024-07-24 DIAGNOSIS — N898 Other specified noninflammatory disorders of vagina: Secondary | ICD-10-CM

## 2024-07-24 DIAGNOSIS — O365930 Maternal care for other known or suspected poor fetal growth, third trimester, not applicable or unspecified: Secondary | ICD-10-CM | POA: Diagnosis not present

## 2024-07-24 DIAGNOSIS — Z3A29 29 weeks gestation of pregnancy: Secondary | ICD-10-CM | POA: Insufficient documentation

## 2024-07-24 DIAGNOSIS — O0993 Supervision of high risk pregnancy, unspecified, third trimester: Secondary | ICD-10-CM

## 2024-07-24 DIAGNOSIS — O0990 Supervision of high risk pregnancy, unspecified, unspecified trimester: Secondary | ICD-10-CM

## 2024-07-24 DIAGNOSIS — Z113 Encounter for screening for infections with a predominantly sexual mode of transmission: Secondary | ICD-10-CM | POA: Diagnosis not present

## 2024-07-24 DIAGNOSIS — O0992 Supervision of high risk pregnancy, unspecified, second trimester: Secondary | ICD-10-CM

## 2024-07-24 NOTE — Progress Notes (Signed)
 HIGH-RISK PREGNANCY VISIT Patient name: Ellen Huffman MRN [id number]  Date of birth: 19-Jan-2001 Chief Complaint:   Routine Prenatal Visit  History of Present Illness:   Ellen Huffman is a 23 y.o. G7P0030 female at [redacted]w[redacted]d with an Estimated Date of Delivery: 10/06/24 being seen today for ongoing management of a high-risk pregnancy complicated by fetal growth restriction AC 9.5%.    Today she reports no complaints.  .  .   . denies leaking of fluid.      03/24/2024    1:35 PM 01/18/2023   10:44 AM 12/08/2022    9:27 AM 11/14/2021    1:50 PM 08/09/2021   10:40 AM  Depression screen PHQ 2/9  Decreased Interest 1 1 0 1   Down, Depressed, Hopeless 0 1 0 1   PHQ - 2 Score 1 2 0 2   Altered sleeping 0 1 0 1   Tired, decreased energy 3 1 0 2   Change in appetite 0 1 0 2   Feeling bad or failure about yourself  0 1 0 0   Trouble concentrating 0 0 0 0   Moving slowly or fidgety/restless 0 0 0 0   Suicidal thoughts 0 0 0 0   PHQ-9 Score 4 6 0 7   Difficult doing work/chores   Not difficult at all Not difficult at all      Information is confidential and restricted. Go to Review Flowsheets to unlock data.        03/24/2024    1:35 PM 01/18/2023   10:44 AM 12/08/2022    9:28 AM 11/14/2021    1:51 PM  GAD 7 : Generalized Anxiety Score  Nervous, Anxious, on Edge 2 3 0 1  Control/stop worrying 3 3 0 1  Worry too much - different things 3 3 0 1  Trouble relaxing 1 2 0 1  Restless 0 2 0 1  Easily annoyed or irritable 2 3 2 2   Afraid - awful might happen 2 1 2 1   Total GAD 7 Score 13 17 4 8   Anxiety Difficulty   Not difficult at all Not difficult at all     Review of Systems:   Pertinent items are noted in HPI Denies abnormal vaginal discharge w/ itching/odor/irritation, headaches, visual changes, shortness of breath, chest pain, abdominal pain, severe nausea/vomiting, or problems with urination or bowel movements unless otherwise stated above. Pertinent History Reviewed:  Reviewed past  medical,surgical, social, obstetrical and family history.  Reviewed problem list, medications and allergies. Physical Assessment:  There were no vitals filed for this visit.There is no height or weight on file to calculate BMI.           Physical Examination:   General appearance: alert, well appearing, and in no distress  Mental status: alert, oriented to person, place, and time  Skin: warm & dry   Extremities:      Cardiovascular: normal heart rate noted  Respiratory: normal respiratory effort, no distress  Abdomen: gravid, soft, non-tender  Pelvic: Cervical exam deferred         Chaperone: N/A     Fetal Surveillance Testing today: US  29+3 breech,anterior placenta gr 3,FHR 155 bpm,RI .67,.65,.72,.69=70%,BPP 8/8,CX 2.8 cm,limited view because of fetal position      No results found for this or any previous visit (from the past 24 hours).  Assessment & Plan:  High-risk pregnancy: G4P0030 at [redacted]w[redacted]d with an Estimated Date of Delivery: 10/06/24   1. [redacted] weeks gestation  of pregnancy (Primary)   2. Poor fetal growth affecting management of mother in third trimester, single or unspecified fetus Treatment Plan:     UAD EFW Testing Delivery  EFW/AC 3-9% Q 1-2wk Q 3wk 28w-weekly BPP 32w-add NST 38.0-39.0     3. Supervision of high risk pregnancy, antepartum     Meds: No orders of the defined types were placed in this encounter.   Orders: No orders of the defined types were placed in this encounter.    Labs/procedures today: U/S   Reviewed: Preterm labor symptoms and general obstetric precautions including but not limited to vaginal bleeding, contractions, leaking of fluid and fetal movement were reviewed in detail with the patient.  All questions were answered. Does have home bp cuff. Office bp cuff given: not applicable. Check bp weekly, let us  know if consistently >140 and/or >90.  Follow-up: No follow-ups on file.   Future Appointments  Date Time Provider Department  Center  08/01/2024  8:30 AM [facility] - [facility] US  [facility] None  08/01/2024  9:30 AM Delores Nidia CROME, FNP [facility]  08/06/2024  1:30 PM [facility] US  [facility] None  08/06/2024  2:50 PM Loreli Suzen BIRCH, CNM [facility]  08/11/2024 10:50 AM [facility] NURSE [facility]  08/14/2024 10:00 AM [facility] US  [facility] None  08/14/2024 10:50 AM Kizzie Suzen SAUNDERS, CNM [facility]  08/18/2024  2:30 PM [facility] NURSE [facility]  08/21/2024  2:15 PM [facility] US  [facility] None  08/21/2024  3:10 PM Kizzie Suzen SAUNDERS, CNM [facility]  08/28/2024 10:45 AM [facility] US  [facility] None  08/28/2024 11:30 AM Kizzie Suzen SAUNDERS, CNM [facility]  09/01/2024 10:10 AM [facility] NURSE [facility]  09/04/2024  1:30 PM [facility] US  [facility] None  09/04/2024  2:30 PM Marilynn Nest, DO [facility]  09/08/2024 10:10 AM [facility] NURSE [facility]  09/11/2024  3:00 PM [facility] US  [facility] None  09/11/2024  3:50 PM Kizzie Suzen SAUNDERS, CNM [facility]  09/15/2024 10:50 AM [facility] NURSE [facility]  09/18/2024  3:00 PM [facility] US  [facility] None  09/18/2024  3:50 PM Marilynn Nest, DO [facility]  09/22/2024 10:50 AM [facility] NURSE [facility]  09/29/2024 11:10 AM [facility] NURSE [facility]  10/02/2024 10:45 AM [facility] US  [facility] None  10/06/2024 10:50 AM [facility] NURSE [facility]    No orders of the defined types were placed in this encounter.  Cathlean Ely , DNP, CNM [facility] 07/24/2024 10:19 AM

## 2024-07-24 NOTE — Progress Notes (Signed)
 US  29+3 breech,anterior placenta gr 3,FHR 155 bpm,RI .67,.65,.72,.69=70%,BPP 8/8,CX 2.8 cm,limited view because of fetal position

## 2024-07-25 ENCOUNTER — Other Ambulatory Visit: Payer: Self-pay | Admitting: *Deleted

## 2024-07-25 MED ORDER — M-NATAL PLUS 27-1 MG PO TABS: 1.0000 | ORAL_TABLET | Freq: Every day | ORAL | 11 refills | Status: AC

## 2024-07-29 LAB — CERVICOVAGINAL ANCILLARY ONLY
Bacterial Vaginitis (gardnerella): POSITIVE — AB
Candida Glabrata: NEGATIVE
Candida Vaginitis: NEGATIVE
Chlamydia: NEGATIVE
Comment: NEGATIVE
Comment: NEGATIVE
Comment: NEGATIVE
Comment: NEGATIVE
Comment: NEGATIVE
Comment: NORMAL
Neisseria Gonorrhea: NEGATIVE
Trichomonas: NEGATIVE

## 2024-07-31 ENCOUNTER — Other Ambulatory Visit: Payer: Self-pay | Admitting: Advanced Practice Midwife

## 2024-07-31 ENCOUNTER — Ambulatory Visit: Payer: Self-pay | Admitting: Advanced Practice Midwife

## 2024-07-31 NOTE — Progress Notes (Unsigned)
Flagyl for BV

## 2024-08-01 ENCOUNTER — Ambulatory Visit (INDEPENDENT_AMBULATORY_CARE_PROVIDER_SITE_OTHER)

## 2024-08-01 ENCOUNTER — Ambulatory Visit: Admitting: Obstetrics and Gynecology

## 2024-08-01 VITALS — BP 130/82 | Wt 220.4 lb

## 2024-08-01 DIAGNOSIS — O365920 Maternal care for other known or suspected poor fetal growth, second trimester, not applicable or unspecified: Secondary | ICD-10-CM

## 2024-08-01 DIAGNOSIS — O365930 Maternal care for other known or suspected poor fetal growth, third trimester, not applicable or unspecified: Secondary | ICD-10-CM

## 2024-08-01 DIAGNOSIS — O0993 Supervision of high risk pregnancy, unspecified, third trimester: Secondary | ICD-10-CM | POA: Diagnosis not present

## 2024-08-01 DIAGNOSIS — O0990 Supervision of high risk pregnancy, unspecified, unspecified trimester: Secondary | ICD-10-CM

## 2024-08-01 DIAGNOSIS — O0992 Supervision of high risk pregnancy, unspecified, second trimester: Secondary | ICD-10-CM

## 2024-08-01 DIAGNOSIS — Z3A30 30 weeks gestation of pregnancy: Secondary | ICD-10-CM

## 2024-08-01 NOTE — Progress Notes (Signed)
 PRENATAL VISIT NOTE  Subjective:  Ellen Huffman is a 23 y.o. G4P0030 at [redacted]w[redacted]d being seen today for ongoing prenatal care.  She is currently monitored for the following issues for this high-risk pregnancy and has ADHD (attention deficit hyperactivity disorder), inattentive type; Generalized anxiety disorder; Deliberate self-cutting; MDD (major depressive disorder), recurrent severe, without psychosis (HCC); Cannabis abuse; Suicide attempt (HCC); Autonomic dysfunction; Anxiety state; Palpitations; Opioid abuse with opioid-induced mood disorder (HCC); Bipolar I disorder, most recent episode (or current) manic (HCC); Bicornuate uterus; Supervision of high risk pregnancy, antepartum; and IUGR (intrauterine growth restriction) affecting care of mother on their problem list.  Patient reports no complaints.  Contractions: Irritability. Vag. Bleeding: None.  Movement: Present. Denies leaking of fluid.   The following portions of the patient's history were reviewed and updated as appropriate: allergies, current medications, past family history, past medical history, past social history, past surgical history and problem list.   Objective:    Vitals:   08/01/24 0910 08/01/24 0912  BP: (!) 159/82 130/82  Weight: 100 kg     Fetal Status:  FHR 155, BPP 8/8    Movement: Present    General: Alert, oriented and cooperative. Patient is in no acute distress.  Skin: Skin is warm and dry. No rash noted.   Cardiovascular: Normal heart rate noted  Respiratory: Normal respiratory effort, no problems with respiration noted  Abdomen: Soft, gravid, appropriate for gestational age.  Pain/Pressure: Absent     Pelvic: Cervical exam deferred        Extremities: Normal range of motion.     Mental Status: Normal mood and affect. Normal behavior. Normal judgment and thought content.   Assessment and Plan:  Pregnancy: G4P0030 at [redacted]w[redacted]d  1. Supervision of high risk pregnancy in third trimester (Primary) No concerns  today, feeling regular fetal movement  Initial BP elevated with normal recheck, will send message for patient to monitor at home  2. [redacted] weeks gestation of pregnancy   3. Poor fetal growth affecting management of mother in third trimester, single or unspecified fetus EFW 7%, BPP 8/8, normal dopplers today Continue weekly antenatal testing, begin twice weekly testing at 32w   Preterm labor symptoms and general obstetric precautions including but not limited to vaginal bleeding, contractions, leaking of fluid and fetal movement were reviewed in detail with the patient. Please refer to After Visit Summary for other counseling recommendations.   No follow-ups on file.  Future Appointments  Date Time Provider Department Center  08/06/2024  1:30 PM [facility] - [facility]TO[facility]YN US  [facility] None  08/06/2024  2:50 PM Loreli Suzen BIRCH, CNM [facility]  08/11/2024 10:50 AM [facility] NURSE [facility]  08/14/2024 10:00 AM [facility] - [facility] US  [facility] None  08/14/2024 10:50 AM Kizzie Suzen SAUNDERS, CNM [facility]  08/18/2024  2:30 PM [facility] NURSE [facility]  08/21/2024  2:15 PM [facility] - [facility]YN US  [facility] None  08/21/2024  3:10 PM Kizzie Suzen SAUNDERS, CNM [facility]  08/28/2024 10:45 AM [facility] - FTO[facility]YN US  [facility] None  08/28/2024 11:30 AM Kizzie Suzen SAUNDERS, CNM [facility]  09/01/2024 10:10 AM [facility] NURSE [facility]  09/04/2024  1:30 PM [facility] - [facility] US  [facility] None  09/04/2024  2:30 PM Marilynn Nest, DO [facility]  09/08/2024 10:10 AM [facility] NURSE [facility]  09/11/2024  3:00 PM [facility] - FTO[facility]YN US  [facility] None  09/11/2024  3:50 PM Newton Mering, CNM [facility]  09/15/2024 10:50 AM [facility] NURSE [facility]  09/18/2024  3:00 PM [facility] -  FTOBGYN US  [facility] None  09/18/2024  3:50 PM Marilynn Nest, DO [facility]  09/22/2024 10:50 AM [facility] NURSE [facility]  09/29/2024 11:10 AM [facility] NURSE [facility]  10/02/2024 10:45 AM [facility] - FTOBGYN US   [facility] None  10/06/2024 10:50 AM [facility] NURSE [facility]   Vernell Ruddle, SNM 08/01/24 [phone]

## 2024-08-01 NOTE — Progress Notes (Signed)
 US  30+4 wks,frank breech,anterior fundal placenta gr 2,CX 3.1 cm,AFI 19 cm,FHR 155 bpm,normal ovaries,BPP 8/8,EFW 1335 g 7%,AC 13%,RI .61,.61,.55=29%

## 2024-08-06 ENCOUNTER — Ambulatory Visit

## 2024-08-06 ENCOUNTER — Ambulatory Visit: Admitting: Advanced Practice Midwife

## 2024-08-06 VITALS — BP 130/85 | HR 88 | Wt 224.0 lb

## 2024-08-06 DIAGNOSIS — Z3A31 31 weeks gestation of pregnancy: Secondary | ICD-10-CM | POA: Diagnosis not present

## 2024-08-06 DIAGNOSIS — O0992 Supervision of high risk pregnancy, unspecified, second trimester: Secondary | ICD-10-CM

## 2024-08-06 DIAGNOSIS — O0990 Supervision of high risk pregnancy, unspecified, unspecified trimester: Secondary | ICD-10-CM

## 2024-08-06 DIAGNOSIS — O0993 Supervision of high risk pregnancy, unspecified, third trimester: Secondary | ICD-10-CM

## 2024-08-06 DIAGNOSIS — O365920 Maternal care for other known or suspected poor fetal growth, second trimester, not applicable or unspecified: Secondary | ICD-10-CM

## 2024-08-06 DIAGNOSIS — O365930 Maternal care for other known or suspected poor fetal growth, third trimester, not applicable or unspecified: Secondary | ICD-10-CM | POA: Diagnosis not present

## 2024-08-06 NOTE — Progress Notes (Signed)
 HIGH-RISK PREGNANCY VISIT Patient name: Ellen Huffman MRN [id number]  Date of birth: 2001-01-27 Chief Complaint:   Routine Prenatal Visit  History of Present Illness:   Ellen Huffman is a 23 y.o. G12P0030 female at [redacted]w[redacted]d with an Estimated Date of Delivery: 10/06/24 being seen today for ongoing management of a high-risk pregnancy complicated by fetal growth restriction 7%.    Today she reports no complaints. Contractions: Irritability. Vag. Bleeding: None.  Movement: Present. denies leaking of fluid.      03/24/2024    1:35 PM 01/18/2023   10:44 AM 12/08/2022    9:27 AM 11/14/2021    1:50 PM 08/09/2021   10:40 AM  Depression screen PHQ 2/9  Decreased Interest 1 1 0 1   Down, Depressed, Hopeless 0 1 0 1   PHQ - 2 Score 1 2 0 2   Altered sleeping 0 1 0 1   Tired, decreased energy 3 1 0 2   Change in appetite 0 1 0 2   Feeling bad or failure about yourself  0 1 0 0   Trouble concentrating 0 0 0 0   Moving slowly or fidgety/restless 0 0 0 0   Suicidal thoughts 0 0 0 0   PHQ-9 Score 4 6 0 7   Difficult doing work/chores   Not difficult at all Not difficult at all      Information is confidential and restricted. Go to Review Flowsheets to unlock data.        03/24/2024    1:35 PM 01/18/2023   10:44 AM 12/08/2022    9:28 AM 11/14/2021    1:51 PM  GAD 7 : Generalized Anxiety Score  Nervous, Anxious, on Edge 2 3 0 1  Control/stop worrying 3 3 0 1  Worry too much - different things 3 3 0 1  Trouble relaxing 1 2 0 1  Restless 0 2 0 1  Easily annoyed or irritable 2 3 2 2   Afraid - awful might happen 2 1 2 1   Total GAD 7 Score 13 17 4 8   Anxiety Difficulty   Not difficult at all Not difficult at all     Review of Systems:   Pertinent items are noted in HPI Denies abnormal vaginal discharge w/ itching/odor/irritation, headaches, visual changes, shortness of breath, chest pain, abdominal pain, severe nausea/vomiting, or problems with urination or bowel movements unless otherwise stated  above. Pertinent History Reviewed:  Reviewed past medical,surgical, social, obstetrical and family history.  Reviewed problem list, medications and allergies. Physical Assessment:   Vitals:   08/06/24 1425  BP: 130/85  Pulse: 88  Weight: 224 lb (101.6 kg)  Body mass index is 36.15 kg/m.           Physical Examination:   General appearance: alert, well appearing, and in no distress  Mental status: alert, oriented to person, place, and time  Skin: warm & dry   Extremities:      Cardiovascular: normal heart rate noted  Respiratory: normal respiratory effort, no distress  Abdomen: gravid, soft, non-tender  Pelvic: Cervical exam deferred         Fetal Status: Fetal Heart Rate (bpm): 136 u/s   Movement: Present    Fetal Surveillance Testing today: US  31+2 WKS,frank beech,anterior placenta gr 3,AFI 20 cm,BPP 8/8,FHR 136 bpm,RI .64,.58,.64,.50=11%     No results found for this or any previous visit (from the past 24 hours).  Assessment & Plan:  High-risk pregnancy: G4P0030 at [redacted]w[redacted]d with an Estimated  Date of Delivery: 10/06/24   1) FGR, nl dopplers; baby breech- reviewed in light of bicornuate uterus; she will try exercises   Meds: No orders of the defined types were placed in this encounter.   Labs/procedures today: U/S  Treatment Plan:  2x/wk testing with growth q 3-4wks  Reviewed: Preterm labor symptoms and general obstetric precautions including but not limited to vaginal bleeding, contractions, leaking of fluid and fetal movement were reviewed in detail with the patient.  All questions were answered. Does have home bp cuff. Office bp cuff given: not applicable. Check bp daily, let us  know if consistently >140 and/or >90.  Follow-up: Return for As scheduled.   Future Appointments  Date Time Provider Department Center  08/11/2024 10:50 AM [facility] NURSE [facility]  08/14/2024 10:00 AM [facility] - FTO[facility]YN US  [facility] None  08/14/2024 10:50 AM Kizzie Suzen SAUNDERS, CNM [facility]  FTOBGYN  08/18/2024  2:30 PM [facility] NURSE [facility]  08/21/2024  2:15 PM [facility] - F[facility]YN US  [facility] None  08/21/2024  3:10 PM Kizzie Suzen SAUNDERS, CNM [facility]  08/28/2024 10:45 AM [facility] US  [facility] None  08/28/2024 11:30 AM Kizzie Suzen SAUNDERS, CNM [facility]  09/01/2024 10:10 AM [facility] NURSE [facility]  09/04/2024  1:30 PM [facility] - [facility] US  [facility] None  09/04/2024  2:30 PM Ellen, Jennifer, DO [facility]  09/08/2024 10:10 AM [facility] NURSE [facility]  09/11/2024  3:00 PM [facility] - [facility] US  [facility] None  09/11/2024  3:50 PM Cresenzo-Dishmon, Cathlean, CNM [facility]  09/15/2024 10:50 AM [facility] NURSE [facility]  09/18/2024  3:00 PM [facility] - [facility] US  [facility] None  09/18/2024  3:50 PM Marilynn Nest, DO [facility]  09/22/2024 10:50 AM [facility] NURSE [facility]  09/29/2024 11:10 AM [facility] NURSE [facility]  10/02/2024 10:45 AM [facility] - [facility] US  [facility] None  10/06/2024 10:50 AM [facility] N[facility]SE [facility]    No orders of the defined types were placed in this encounter.  Suzen JONETTA Gentry CNM 08/06/2024 3:14 PM

## 2024-08-06 NOTE — Progress Notes (Signed)
 US  31+2 WKS,Ellen Huffman,anterior placenta gr 3,AFI 20 cm,BPP 8/8,FHR 136 bpm,RI .64,.58,.64,.50=11%

## 2024-08-06 NOTE — Patient Instructions (Signed)
 [place], thank you for choosing our office today! We appreciate the opportunity to meet your healthcare needs. You may receive a short survey by mail, e-mail, or through [employer]. If you are happy with your care we would appreciate if you could take just a few minutes to complete the survey questions. We read all of your comments and take your feedback very seriously. Thank you again for choosing our office.  Center for [employer] Team at [facility]  [facility] at [facility] ([address] [place], [place] [address]) Entrance C, located off of [address] Free 24/7 valet parking   CLASSES: Go to [email or web address] to register for classes (childbirth, breastfeeding, waterbirth, infant CPR, daddy bootcamp, etc.)  Call the office [phone]) or go to [facility] if: You begin to have strong, frequent contractions Your water breaks.  Sometimes it is a big gush of fluid, sometimes it is just a trickle that keeps getting your panties wet or running down your legs You have vaginal bleeding.  It is normal to have a small amount of spotting if your cervix was checked.  You don't feel your baby moving like normal.  If you don't, get you something to eat and drink and lay down and focus on feeling your baby move.   If your baby is still not moving like normal, you should call the office or go to [facility].  Call the office [phone]) or go to [facility] hospital for these signs of pre-eclampsia: Severe headache that does not go away with Tylenol  Visual changes- seeing spots, double, blurred vision Pain under your right breast or upper abdomen that does not go away with Tums or heartburn medicine Nausea and/or vomiting Severe swelling in your hands, feet, and face   Tdap Vaccine It is recommended that you get the Tdap vaccine during the third trimester of EACH pregnancy to help protect your baby from getting pertussis (whooping cough) 27-36 weeks is the BEST time to do  this so that you can pass the protection on to your baby. During pregnancy is better than after pregnancy, but if you are unable to get it during pregnancy it will be offered at the hospital.  You can get this vaccine with us , at the health department, your family doctor, or some local pharmacies Everyone who will be around your baby should also be up-to-date on their vaccines before the baby comes. Adults (who are not pregnant) only need 1 dose of Tdap during adulthood.   [facility] Pediatricians/Family Doctors [facility]): [address] Luba BROCKS, [phone]           [facility]: [address], [phone]                [facility]): [address], [phone] (call to ask if accepting patients) [facility] Department: [address], [place], [phone]    [facility] Pediatricians/Family Doctors [facility]): [phone] [address], [phone] [facility]: [address] [place], [phone] [facility]: [address], [phone]  [facility] Doctors  [facility]): [phone] [facility]: [address], [phone]   [facility] Doctors [facility]: [address], [phone]  [facility] Family Doctors  [facility]: [phone], [phone]  Home Blood Pressure Monitoring for Patients   Your provider has recommended that you check your  blood pressure (BP) at least once a week at home. If you do not have a blood pressure cuff at home, one will be provided for you. Contact your provider if you have not received your monitor within 1 week.   Helpful Tips for Accurate Home Blood Pressure Checks  Don't smoke, exercise, or drink caffeine 30 minutes before checking your BP Use the restroom before checking your BP (a full bladder can raise your  pressure) Relax in a comfortable upright chair Feet on the ground Left arm resting comfortably on a flat surface at the level of your heart Legs uncrossed Back supported Sit quietly and don't talk Place the cuff on your bare arm Adjust snuggly, so that only two fingertips can fit between your skin and the top of the cuff Check 2 readings separated by at least one minute Keep a log of your BP readings For a visual, please reference this diagram: http://ccnc.care/bpdiagram  Provider Name: [facility]     Phone: [phone]  Zone 1: ALL CLEAR  Continue to monitor your symptoms:  BP reading is less than 140 (top number) or less than 90 (bottom number)  No right upper stomach pain No headaches or seeing spots No feeling nauseated or throwing up No swelling in face and hands  Zone 2: CAUTION Call your doctor's office for any of the following:  BP reading is greater than 140 (top number) or greater than 90 (bottom number)  Stomach pain under your ribs in the middle or right side Headaches or seeing spots Feeling nauseated or throwing up Swelling in face and hands  Zone 3: EMERGENCY  Seek immediate medical care if you have any of the following:  BP reading is greater than160 (top number) or greater than 110 (bottom number) Severe headaches not improving with Tylenol  Serious difficulty catching your breath Any worsening symptoms from Zone 2   Third Trimester of Pregnancy The third trimester is from week 29 through week 42, months 7 through 9. The third trimester is a time when the fetus is growing rapidly. At the end of the ninth month, the fetus is about 20 inches in length and weighs 6-10 pounds.  BODY CHANGES Your body goes through many changes during pregnancy. The changes vary from woman to woman.  Your weight will continue to increase. You can expect to gain 25-35 pounds (11-16 kg) by the end of the pregnancy. You may begin to get stretch marks on your hips, abdomen,  and breasts. You may urinate more often because the fetus is moving lower into your pelvis and pressing on your bladder. You may develop or continue to have heartburn as a result of your pregnancy. You may develop constipation because certain hormones are causing the muscles that push waste through your intestines to slow down. You may develop hemorrhoids or swollen, bulging veins (varicose veins). You may have pelvic pain because of the weight gain and pregnancy hormones relaxing your joints between the bones in your pelvis. Backaches may result from overexertion of the muscles supporting your posture. You may have changes in your hair. These can include thickening of your hair, rapid growth, and changes in texture. Some women also have hair loss during or after pregnancy, or hair that feels dry or thin. Your hair will most likely return to normal after your baby is born. Your breasts will continue to grow and be tender. A yellow discharge may leak from your breasts called colostrum. Your belly button may stick out. You may  feel short of breath because of your expanding uterus. You may notice the fetus dropping, or moving lower in your abdomen. You may have a bloody mucus discharge. This usually occurs a few days to a week before labor begins. Your cervix becomes thin and soft (effaced) near your due date. WHAT TO EXPECT AT YOUR PRENATAL EXAMS  You will have prenatal exams every 2 weeks until week 36. Then, you will have weekly prenatal exams. During a routine prenatal visit: You will be weighed to make sure you and the fetus are growing normally. Your blood pressure is taken. Your abdomen will be measured to track your baby's growth. The fetal heartbeat will be listened to. Any test results from the previous visit will be discussed. You may have a cervical check near your due date to see if you have effaced. At around 36 weeks, your caregiver will check your cervix. At the same time, your  caregiver will also perform a test on the secretions of the vaginal tissue. This test is to determine if a type of bacteria, Group B streptococcus, is present. Your caregiver will explain this further. Your caregiver may ask you: What your birth plan is. How you are feeling. If you are feeling the baby move. If you have had any abnormal symptoms, such as leaking fluid, bleeding, severe headaches, or abdominal cramping. If you have any questions. Other tests or screenings that may be performed during your third trimester include: Blood tests that check for low iron levels (anemia). Fetal testing to check the health, activity level, and growth of the fetus. Testing is done if you have certain medical conditions or if there are problems during the pregnancy. FALSE LABOR You may feel small, irregular contractions that eventually go away. These are called Braxton Hicks contractions, or false labor. Contractions may last for hours, days, or even weeks before true labor sets in. If contractions come at regular intervals, intensify, or become painful, it is best to be seen by your caregiver.  SIGNS OF LABOR  Menstrual-like cramps. Contractions that are 5 minutes apart or less. Contractions that start on the top of the uterus and spread down to the lower abdomen and back. A sense of increased pelvic pressure or back pain. A watery or bloody mucus discharge that comes from the vagina. If you have any of these signs before the 37th week of pregnancy, call your caregiver right away. You need to go to the hospital to get checked immediately. HOME CARE INSTRUCTIONS  Avoid all smoking, herbs, alcohol, and unprescribed drugs. These chemicals affect the formation and growth of the baby. Follow your caregiver's instructions regarding medicine use. There are medicines that are either safe or unsafe to take during pregnancy. Exercise only as directed by your caregiver. Experiencing uterine cramps is a good sign to  stop exercising. Continue to eat regular, healthy meals. Wear a good support bra for breast tenderness. Do not use hot tubs, steam rooms, or saunas. Wear your seat belt at all times when driving. Avoid raw meat, uncooked cheese, cat litter boxes, and soil used by cats. These carry germs that can cause birth defects in the baby. Take your prenatal vitamins. Try taking a stool softener (if your caregiver approves) if you develop constipation. Eat more high-fiber foods, such as fresh vegetables or fruit and whole grains. Drink plenty of fluids to keep your urine clear or pale yellow. Take warm sitz baths to soothe any pain or discomfort caused by hemorrhoids. Use hemorrhoid cream if  your caregiver approves. If you develop varicose veins, wear support hose. Elevate your feet for 15 minutes, 3-4 times a day. Limit salt in your diet. Avoid heavy lifting, wear low heal shoes, and practice good posture. Rest a lot with your legs elevated if you have leg cramps or low back pain. Visit your dentist if you have not gone during your pregnancy. Use a soft toothbrush to brush your teeth and be gentle when you floss. A sexual relationship may be continued unless your caregiver directs you otherwise. Do not travel far distances unless it is absolutely necessary and only with the approval of your caregiver. Take prenatal classes to understand, practice, and ask questions about the labor and delivery. Make a trial run to the hospital. Pack your hospital bag. Prepare the baby's nursery. Continue to go to all your prenatal visits as directed by your caregiver. SEEK MEDICAL CARE IF: You are unsure if you are in labor or if your water has broken. You have dizziness. You have mild pelvic cramps, pelvic pressure, or nagging pain in your abdominal area. You have persistent nausea, vomiting, or diarrhea. You have a bad smelling vaginal discharge. You have pain with urination. SEEK IMMEDIATE MEDICAL CARE IF:  You  have a fever. You are leaking fluid from your vagina. You have spotting or bleeding from your vagina. You have severe abdominal cramping or pain. You have rapid weight loss or gain. You have shortness of breath with chest pain. You notice sudden or extreme swelling of your face, hands, ankles, feet, or legs. You have not felt your baby move in over an hour. You have severe headaches that do not go away with medicine. You have vision changes. Document Released: 12/05/2001 Document Revised: 12/16/2013 Document Reviewed: 02/11/2013 [facility] Patient Information 2015 [place], [place]. This information is not intended to replace advice given to you by your health care provider. Make sure you discuss any questions you have with your health care provider.

## 2024-08-11 ENCOUNTER — Ambulatory Visit (INDEPENDENT_AMBULATORY_CARE_PROVIDER_SITE_OTHER): Admitting: *Deleted

## 2024-08-11 VITALS — BP 120/77 | HR 82 | Wt 222.0 lb

## 2024-08-11 DIAGNOSIS — Z3A32 32 weeks gestation of pregnancy: Secondary | ICD-10-CM

## 2024-08-11 DIAGNOSIS — O0993 Supervision of high risk pregnancy, unspecified, third trimester: Secondary | ICD-10-CM | POA: Diagnosis not present

## 2024-08-11 DIAGNOSIS — O365930 Maternal care for other known or suspected poor fetal growth, third trimester, not applicable or unspecified: Secondary | ICD-10-CM

## 2024-08-11 DIAGNOSIS — O0990 Supervision of high risk pregnancy, unspecified, unspecified trimester: Secondary | ICD-10-CM

## 2024-08-11 NOTE — Progress Notes (Signed)
   NURSE VISIT- NST  SUBJECTIVE:  Ellen Huffman is a 23 y.o. G51P0030 female at [redacted]w[redacted]d, here for a NST for pregnancy complicated by FGR.  She reports active fetal movement, contractions: none, vaginal bleeding: none, membranes: intact.   OBJECTIVE:  BP 120/77   Pulse 82   Wt 222 lb (100.7 kg)   LMP 12/15/2023   BMI 35.83 kg/m   Appears well, no apparent distress  No results found for this or any previous visit (from the past 24 hours).  NST: FHR baseline 140 bpm, Variability: moderate, Accelerations:present, Decelerations:  Absent= Cat 1/reactive Toco: none   ASSESSMENT: G4P0030 at [redacted]w[redacted]d with FGR NST reactive  PLAN: EFM strip reviewed by Dr. Ozan   Recommendations: keep next appointment as scheduled    Ellen Huffman  08/11/2024 11:50 AM

## 2024-08-14 ENCOUNTER — Ambulatory Visit: Admitting: Women's Health

## 2024-08-14 ENCOUNTER — Ambulatory Visit

## 2024-08-14 ENCOUNTER — Encounter: Payer: Self-pay | Admitting: Women's Health

## 2024-08-14 VITALS — BP 133/84 | HR 94 | Wt 223.8 lb

## 2024-08-14 DIAGNOSIS — Z3A32 32 weeks gestation of pregnancy: Secondary | ICD-10-CM | POA: Diagnosis not present

## 2024-08-14 DIAGNOSIS — O365920 Maternal care for other known or suspected poor fetal growth, second trimester, not applicable or unspecified: Secondary | ICD-10-CM

## 2024-08-14 DIAGNOSIS — O0993 Supervision of high risk pregnancy, unspecified, third trimester: Secondary | ICD-10-CM

## 2024-08-14 DIAGNOSIS — O365930 Maternal care for other known or suspected poor fetal growth, third trimester, not applicable or unspecified: Secondary | ICD-10-CM

## 2024-08-14 DIAGNOSIS — O0992 Supervision of high risk pregnancy, unspecified, second trimester: Secondary | ICD-10-CM

## 2024-08-14 DIAGNOSIS — O0990 Supervision of high risk pregnancy, unspecified, unspecified trimester: Secondary | ICD-10-CM

## 2024-08-14 NOTE — Patient Instructions (Addendum)
 Ellen Huffman, thank you for choosing our office today! We appreciate the opportunity to meet your healthcare needs. You may receive a short survey by mail, e-mail, or through [employer]. If you are happy with your care we would appreciate if you could take just a few minutes to complete the survey questions. We read all of your comments and take your feedback very seriously. Thank you again for choosing our office.  Center for [employer] Team at [facility]  [facility] at [facility] ([address] [place], [place] [address]) Entrance C, located off of [address] [employer] [address] valet parking   So your baby is breech? Here are some things you can try to encourage baby to turn head down: [email or web address].[email or web address] (different positions to try and more information) [facility] [phone]) $120 new patients, do not file insurances [facility] [phone]) Webster's technique- has to be done by a chiropractor certified in the technique Dive into the deep end of a swimming pool, or do a hand-stand in a swimming pool (the water should be deep enough that your belly is completely covered)   CLASSES: Go to [email or web address] to register for classes (childbirth, breastfeeding, waterbirth, infant CPR, daddy bootcamp, etc.)  Call the office [phone]) or go to [facility] if: You begin to have strong, frequent contractions Your water breaks.  Sometimes it is a big gush of fluid, sometimes it is just a trickle that keeps getting your panties wet or running down your legs You have vaginal bleeding.  It is normal to have a small amount of spotting if your cervix was checked.  You don't feel your baby moving like normal.  If you don't, get you something to eat and drink and lay down and focus on feeling your baby move.   If your baby is still not moving like normal, you should call the office or go to [facility].  Call the office [phone]) or  go to [facility] hospital for these signs of pre-eclampsia: Severe headache that does not go away with Tylenol  Visual changes- seeing spots, double, blurred vision Pain under your right breast or upper abdomen that does not go away with Tums or heartburn medicine Nausea and/or vomiting Severe swelling in your hands, feet, and face   Tdap Vaccine It is recommended that you get the Tdap vaccine during the third trimester of EACH pregnancy to help protect your baby from getting pertussis (whooping cough) 27-36 weeks is the BEST time to do this so that you can pass the protection on to your baby. During pregnancy is better than after pregnancy, but if you are unable to get it during pregnancy it will be offered at the hospital.  You can get this vaccine with us , at the health department, your family doctor, or some local pharmacies Everyone who will be around your baby should also be up-to-date on their vaccines before the baby comes. Adults (who are not pregnant) only need 1 dose of Tdap during adulthood.   [facility] Pediatricians/Family Doctors [facility]): [address] Luba BROCKS, [phone]           [facility]: [address], [phone]                [facility]): [address], [phone] (call to ask if accepting patients) [facility] Department: [address], [place], [phone]    [facility] Pediatricians/Family Doctors [facility]): [phone], [phone] [facility]: [address] [place], [phone] [facility]: [address], [phone]  [facility] Doctors  [facility]): [phone] [facility]: [address], [phone]   [facility] Doctors [facility]: [address] [phone]  [facility] Doctors  [facility]: [phone], [phone]  Home Blood Pressure Monitoring for Patients   Your provider has recommended that you check your blood pressure (BP) at least once a week at home. If you do not have a blood pressure cuff at home, one will be provided for you. Contact your provider if you have not received your monitor within 1 week.   Helpful Tips for Accurate Home Blood Pressure Checks  Don't smoke, exercise, or drink caffeine 30 minutes before checking your BP Use the restroom before checking your BP (a full bladder can raise your pressure) Relax in a comfortable upright chair Feet on the ground Left arm resting comfortably on a flat surface at the level of your heart Legs uncrossed Back supported Sit quietly and don't talk Place the cuff on your bare arm Adjust snuggly, so that only two fingertips can fit between your skin and the top of the cuff Check 2 readings separated by at least one minute Keep a log of your BP readings For a visual, please reference this diagram: http://ccnc.care/bpdiagram  Provider Name: [facility]     Phone: [phone]  Zone 1: ALL CLEAR  Continue to monitor your symptoms:  BP reading is less than 140 (top number) or less than 90 (bottom number)  No right upper stomach pain No headaches or seeing spots No feeling nauseated or throwing up No swelling in face and hands  Zone 2: CAUTION Call your doctor's office for any of the following:  BP reading is greater than 140 (top number) or greater than 90 (bottom number)  Stomach pain under your ribs in the middle or right side Headaches or seeing spots Feeling nauseated or throwing up Swelling in face and hands  Zone 3: EMERGENCY  Seek immediate medical care if you have any of the following:  BP reading is greater than160 (top number) or greater than 110 (bottom number) Severe headaches not improving with Tylenol  Serious difficulty catching your breath Any worsening symptoms from Zone 2   Preterm Labor and Birth Information  The normal length of a pregnancy is 39-41 weeks. Preterm labor is when labor starts before 37 completed weeks of pregnancy. What are the risk factors for preterm labor? Preterm labor is more likely to occur in women who: Have certain infections during pregnancy such as a bladder infection, sexually transmitted infection, or infection inside the uterus (chorioamnionitis). Have a shorter-than-normal cervix. Have gone into preterm labor before. Have had surgery on their cervix. Are younger than age 41 or older than age 24. Are African American. Are pregnant with twins or multiple babies (multiple gestation). Take street drugs or smoke while pregnant. Do not gain enough weight while pregnant. Became pregnant shortly after having been pregnant. What are the symptoms of preterm labor? Symptoms of preterm labor include: Cramps similar to those that can happen during a menstrual period. The cramps may happen with diarrhea. Pain in the abdomen or lower back. Regular uterine contractions that may feel like tightening of the abdomen. A feeling of increased pressure in the pelvis. Increased watery or bloody mucus discharge from the vagina. Water breaking (  ruptured amniotic sac). Why is it important to recognize signs of preterm labor? It is important to recognize signs of preterm labor because babies who are born prematurely may not be fully developed. This can put them at an increased risk for: Long-term (chronic) heart and lung problems. Difficulty immediately after birth with regulating body systems, including blood sugar, body temperature, heart rate, and breathing rate. Bleeding in the brain. Cerebral palsy. Learning difficulties. Death. These risks are highest for babies who are born before 34 weeks of pregnancy. How is preterm labor treated? Treatment depends on the length of your pregnancy, your condition, and the health of your baby. It may  involve: Having a stitch (suture) placed in your cervix to prevent your cervix from opening too early (cerclage). Taking or being given medicines, such as: Hormone medicines. These may be given early in pregnancy to help support the pregnancy. Medicine to stop contractions. Medicines to help mature the baby's lungs. These may be prescribed if the risk of delivery is high. Medicines to prevent your baby from developing cerebral palsy. If the labor happens before 34 weeks of pregnancy, you may need to stay in the hospital. What should I do if I think I am in preterm labor? If you think that you are going into preterm labor, call your health care provider right away. How can I prevent preterm labor in future pregnancies? To increase your chance of having a full-term pregnancy: Do not use any tobacco products, such as cigarettes, chewing tobacco, and e-cigarettes. If you need help quitting, ask your health care provider. Do not use street drugs or medicines that have not been prescribed to you during your pregnancy. Talk with your health care provider before taking any herbal supplements, even if you have been taking them regularly. Make sure you gain a healthy amount of weight during your pregnancy. Watch for infection. If you think that you might have an infection, get it checked right away. Make sure to tell your health care provider if you have gone into preterm labor before. This information is not intended to replace advice given to you by your health care provider. Make sure you discuss any questions you have with your health care provider. Document Revised: 04/04/2019 Document Reviewed: 05/03/2016 Elsevier Patient Education  2020 [employer].

## 2024-08-14 NOTE — Progress Notes (Addendum)
 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

## 2024-08-14 NOTE — Progress Notes (Signed)
 HIGH-RISK PREGNANCY VISIT Patient name: Ellen Huffman MRN [id number]  Date of birth: [birth year] Chief Complaint:   Routine Prenatal Visit  History of Present Illness:   Ellen Huffman is a 23 y.o. G78P0030 female at [redacted]w[redacted]d with an Estimated Date of Delivery: 10/06/24 being seen today for ongoing management of a high-risk pregnancy complicated by fetal growth restriction 7%.    Today she reports no complaints. Contractions: Not present. Vag. Bleeding: None.  Movement: Present. denies leaking of fluid.      03/24/2024    1:35 PM 01/18/2023   10:44 AM 12/08/2022    9:27 AM 11/14/2021    1:50 PM 08/09/2021   10:40 AM  Depression screen PHQ 2/9  Decreased Interest 1 1 0 1   Down, Depressed, Hopeless 0 1 0 1   PHQ - 2 Score 1 2 0 2   Altered sleeping 0 1 0 1   Tired, decreased energy 3 1 0 2   Change in appetite 0 1 0 2   Feeling bad or failure about yourself  0 1 0 0   Trouble concentrating 0 0 0 0   Moving slowly or fidgety/restless 0 0 0 0   Suicidal thoughts 0 0 0 0   PHQ-9 Score 4 6 0 7   Difficult doing work/chores   Not difficult at all Not difficult at all      Information is confidential and restricted. Go to Review Flowsheets to unlock data.        03/24/2024    1:35 PM 01/18/2023   10:44 AM 12/08/2022    9:28 AM 11/14/2021    1:51 PM  GAD 7 : Generalized Anxiety Score  Nervous, Anxious, on Edge 2 3 0 1  Control/stop worrying 3 3 0 1  Worry too much - different things 3 3 0 1  Trouble relaxing 1 2 0 1  Restless 0 2 0 1  Easily annoyed or irritable 2 3 2 2   Afraid - awful might happen 2 1 2 1   Total GAD 7 Score 13 17 4 8   Anxiety Difficulty   Not difficult at all Not difficult at all     Review of Systems:   Pertinent items are noted in HPI Denies abnormal vaginal discharge w/ itching/odor/irritation, headaches, visual changes, shortness of breath, chest pain, abdominal pain, severe nausea/vomiting, or problems with urination or bowel movements unless otherwise stated  above. Pertinent History Reviewed:  Reviewed past medical,surgical, social, obstetrical and family history.  Reviewed problem list, medications and allergies. Physical Assessment:   Vitals:   08/14/24 1043  BP: 133/84  Pulse: 94  Weight: 223 lb 12.8 oz (101.5 kg)  Body mass index is 36.12 kg/m.           Physical Examination:   General appearance: alert, well appearing, and in no distress  Mental status: alert, oriented to person, place, and time  Skin: warm & dry   Extremities:      Cardiovascular: normal heart rate noted  Respiratory: normal respiratory effort, no distress  Abdomen: gravid, soft, non-tender  Pelvic: Cervical exam deferred         Fetal Status:     Movement: Present    Fetal Surveillance Testing today: US  32+3 wks,frank breech,anterior placenta gr 3,AFI 18 cm,FHR 147 bpm,RI .62,.59,.62,.60=48%,bicornuate uterus,BPP 8/8    Chaperone: N/A  No results found for this or any previous visit (from the past 24 hours).  Assessment & Plan:  High-risk pregnancy: G4P0030 at [redacted]w[redacted]d with  an Estimated Date of Delivery: 10/06/24   1) FGR 7% w/ nl UAD 48%> bpp today 8/8  2) Bicornuate uterus  3) Breech presentation  Meds: No orders of the defined types were placed in this encounter.   Labs/procedures today: U/S  Treatment Plan:    UAD EFW Testing Delivery  EFW/AC 3-9% Q 1-2wk Q 3wk 28w-weekly BPP 32w-add NST 38.0-39.0    Reviewed: Preterm labor symptoms and general obstetric precautions including but not limited to vaginal bleeding, contractions, leaking of fluid and fetal movement were reviewed in detail with the patient.  All questions were answered. Does have home bp cuff. Office bp cuff given: not applicable. Check bp weekly, let us  know if consistently >140 and/or >90.  Follow-up: Return for As scheduled.   Future Appointments  Date Time Provider Department Center  08/18/2024  2:30 PM [facility] NURSE [facility]  08/21/2024  2:15 PM [facility] US   [facility] None  08/21/2024  3:10 PM Kizzie Suzen SAUNDERS, CNM [facility]  08/28/2024 10:45 AM [facility] US  [facility] None  08/28/2024 11:30 AM Kizzie Suzen SAUNDERS, CNM [facility]  09/01/2024 10:10 AM [facility] NURSE [facility]  09/04/2024  1:30 PM [facility] US  [facility] None  09/04/2024  2:30 PM Ozan, Jennifer, DO [facility]  09/08/2024 10:10 AM [facility] NURSE [facility]  09/11/2024  3:00 PM [facility] US  [facility] None  09/11/2024  3:50 PM Cresenzo-Dishmon, Cathlean, CNM [facility]  09/15/2024 10:50 AM [facility] NURSE [facility]  09/18/2024  3:00 PM [facility] US  [facility] None  09/18/2024  3:50 PM Marilynn Nest, DO [facility]  09/22/2024 10:50 AM [facility] NURSE [facility]  09/29/2024 11:10 AM [facility] NURSE [facility]  10/02/2024 10:45 AM [facility] US  [facility] None  10/02/2024 11:30 AM Kizzie Suzen SAUNDERS, CNM [facility]  10/06/2024 10:50 AM [facility] NURSE [facility]    No orders of the defined types were placed in this encounter.  Suzen SAUNDERS Kizzie CNM, [facility] 08/14/2024 11:05 AM

## 2024-08-15 ENCOUNTER — Other Ambulatory Visit: Payer: Self-pay | Admitting: Radiology

## 2024-08-18 ENCOUNTER — Ambulatory Visit: Admitting: *Deleted

## 2024-08-18 VITALS — BP 126/82 | HR 78

## 2024-08-18 DIAGNOSIS — Z3A33 33 weeks gestation of pregnancy: Secondary | ICD-10-CM

## 2024-08-18 DIAGNOSIS — O365930 Maternal care for other known or suspected poor fetal growth, third trimester, not applicable or unspecified: Secondary | ICD-10-CM | POA: Diagnosis not present

## 2024-08-18 NOTE — Progress Notes (Signed)
   NURSE VISIT- NST  SUBJECTIVE:  Ellen Huffman is a 23 y.o. G77P0030 female at [redacted]w[redacted]d, here for a NST for pregnancy complicated by FGR.  She reports decreased  fetal movement, contractions: none, vaginal bleeding: none, membranes: intact.   OBJECTIVE:  BP 126/82   Pulse 78   LMP 12/15/2023   Appears well, no apparent distress  No results found for this or any previous visit (from the past 24 hours).  NST: FHR baseline 140 bpm, Variability: moderate, Accelerations:present, Decelerations:  Absent= Cat 1/reactive Toco: none   ASSESSMENT: G4P0030 at [redacted]w[redacted]d with FGR NST reactive  PLAN: EFM strip reviewed by Luke Fetters, CNM, [facility]   Recommendations: keep next appointment as scheduled    Alan LITTIE Fischer  08/18/2024 10:14 AM

## 2024-08-21 ENCOUNTER — Encounter: Payer: Self-pay | Admitting: Women's Health

## 2024-08-21 ENCOUNTER — Ambulatory Visit

## 2024-08-21 ENCOUNTER — Ambulatory Visit: Admitting: Women's Health

## 2024-08-21 VITALS — BP 132/88 | HR 91 | Wt 224.6 lb

## 2024-08-21 DIAGNOSIS — Z3A33 33 weeks gestation of pregnancy: Secondary | ICD-10-CM | POA: Diagnosis not present

## 2024-08-21 DIAGNOSIS — O0990 Supervision of high risk pregnancy, unspecified, unspecified trimester: Secondary | ICD-10-CM

## 2024-08-21 DIAGNOSIS — O365920 Maternal care for other known or suspected poor fetal growth, second trimester, not applicable or unspecified: Secondary | ICD-10-CM

## 2024-08-21 DIAGNOSIS — O365930 Maternal care for other known or suspected poor fetal growth, third trimester, not applicable or unspecified: Secondary | ICD-10-CM

## 2024-08-21 DIAGNOSIS — O0992 Supervision of high risk pregnancy, unspecified, second trimester: Secondary | ICD-10-CM

## 2024-08-21 DIAGNOSIS — O0993 Supervision of high risk pregnancy, unspecified, third trimester: Secondary | ICD-10-CM

## 2024-08-21 NOTE — Patient Instructions (Signed)
 Taffy, thank you for choosing our office today! We appreciate the opportunity to meet your healthcare needs. You may receive a short survey by mail, e-mail, or through [employer]. If you are happy with your care we would appreciate if you could take just a few minutes to complete the survey questions. We read all of your comments and take your feedback very seriously. Thank you again for choosing our office.  Center for [employer] Team at [facility]  [facility] at [facility] ([address] [place], [place] [address]) Entrance C, located off of [address] Free 24/7 valet parking   CLASSES: Go to [email or web address] to register for classes (childbirth, breastfeeding, waterbirth, infant CPR, daddy bootcamp, etc.)  Call the office [phone]) or go to [facility] if: You begin to have strong, frequent contractions Your water breaks.  Sometimes it is a big gush of fluid, sometimes it is just a trickle that keeps getting your panties wet or running down your legs You have vaginal bleeding.  It is normal to have a small amount of spotting if your cervix was checked.  You don't feel your baby moving like normal.  If you don't, get you something to eat and drink and lay down and focus on feeling your baby move.   If your baby is still not moving like normal, you should call the office or go to [facility].  Call the office [phone]) or go to [facility] hospital for these signs of pre-eclampsia: Severe headache that does not go away with Tylenol  Visual changes- seeing spots, double, blurred vision Pain under your right breast or upper abdomen that does not go away with Tums or heartburn medicine Nausea and/or vomiting Severe swelling in your hands, feet, and face   Tdap Vaccine It is recommended that you get the Tdap vaccine during the third trimester of EACH pregnancy to help protect your baby from getting pertussis (whooping cough) 27-36 weeks is the BEST time to do  this so that you can pass the protection on to your baby. During pregnancy is better than after pregnancy, but if you are unable to get it during pregnancy it will be offered at the hospital.  You can get this vaccine with us , at the health department, your family doctor, or some local pharmacies Everyone who will be around your baby should also be up-to-date on their vaccines before the baby comes. Adults (who are not pregnant) only need 1 dose of Tdap during adulthood.   [facility] Pediatricians/Family Doctors [facility]): [address] [place], [phone]           [facility]: [address], [phone]                [facility]): [address], [phone] (call to ask if accepting patients) [facility] Department: [address], [place], [phone]    [facility] Pediatricians/Family Doctors [facility]): [phone] [address], [phone] [facility]: [address] [place], [phone] [facility]: [address], [phone]  [facility] Doctors  [facility]): [phone] [facility]: [address], [phone]   [facility] Doctors [facility]: [address] [phone]  [facility] Family Doctors  [facility]: [phone], [phone]  Home Blood Pressure Monitoring for Patients   Your provider has recommended that you check your  blood pressure (BP) at least once a week at home. If you do not have a blood pressure cuff at home, one will be provided for you. Contact your provider if you have not received your monitor within 1 week.   Helpful Tips for Accurate Home Blood Pressure Checks  Don't smoke, exercise, or drink caffeine 30 minutes before checking your BP Use the restroom before checking your BP (a full bladder can raise your  pressure) Relax in a comfortable upright chair Feet on the ground Left arm resting comfortably on a flat surface at the level of your heart Legs uncrossed Back supported Sit quietly and don't talk Place the cuff on your bare arm Adjust snuggly, so that only two fingertips can fit between your skin and the top of the cuff Check 2 readings separated by at least one minute Keep a log of your BP readings For a visual, please reference this diagram: http://ccnc.care/bpdiagram  Provider Name: [facility]     Phone: [phone]  Zone 1: ALL CLEAR  Continue to monitor your symptoms:  BP reading is less than 140 (top number) or less than 90 (bottom number)  No right upper stomach pain No headaches or seeing spots No feeling nauseated or throwing up No swelling in face and hands  Zone 2: CAUTION Call your doctor's office for any of the following:  BP reading is greater than 140 (top number) or greater than 90 (bottom number)  Stomach pain under your ribs in the middle or right side Headaches or seeing spots Feeling nauseated or throwing up Swelling in face and hands  Zone 3: EMERGENCY  Seek immediate medical care if you have any of the following:  BP reading is greater than160 (top number) or greater than 110 (bottom number) Severe headaches not improving with Tylenol  Serious difficulty catching your breath Any worsening symptoms from Zone 2  Preterm Labor and Birth Information  The normal length of a pregnancy is 39-41 weeks. Preterm labor is when labor starts before 37 completed weeks of pregnancy. What are the risk factors for preterm labor? Preterm labor is more likely to occur in women who: Have certain infections during pregnancy such as a bladder infection, sexually transmitted infection, or infection inside the uterus (chorioamnionitis). Have a shorter-than-normal cervix. Have gone into preterm labor before. Have had surgery on their cervix. Are younger than age 33  or older than age 59. Are African American. Are pregnant with twins or multiple babies (multiple gestation). Take street drugs or smoke while pregnant. Do not gain enough weight while pregnant. Became pregnant shortly after having been pregnant. What are the symptoms of preterm labor? Symptoms of preterm labor include: Cramps similar to those that can happen during a menstrual period. The cramps may happen with diarrhea. Pain in the abdomen or lower back. Regular uterine contractions that may feel like tightening of the abdomen. A feeling of increased pressure in the pelvis. Increased watery or bloody mucus discharge from the vagina. Water breaking (ruptured amniotic sac). Why is it important to recognize signs of preterm labor? It is important to recognize signs of preterm labor because babies who are born prematurely may not be fully developed. This can put them at an increased risk for: Long-term (chronic) heart and lung problems. Difficulty immediately after birth with regulating body systems, including blood sugar, body temperature, heart rate, and breathing rate. Bleeding in the brain. Cerebral palsy. Learning difficulties. Death. These risks are highest for babies who are born before 34 weeks  of pregnancy. How is preterm labor treated? Treatment depends on the length of your pregnancy, your condition, and the health of your baby. It may involve: Having a stitch (suture) placed in your cervix to prevent your cervix from opening too early (cerclage). Taking or being given medicines, such as: Hormone medicines. These may be given early in pregnancy to help support the pregnancy. Medicine to stop contractions. Medicines to help mature the baby's lungs. These may be prescribed if the risk of delivery is high. Medicines to prevent your baby from developing cerebral palsy. If the labor happens before 34 weeks of pregnancy, you may need to stay in the hospital. What should I do if I  think I am in preterm labor? If you think that you are going into preterm labor, call your health care provider right away. How can I prevent preterm labor in future pregnancies? To increase your chance of having a full-term pregnancy: Do not use any tobacco products, such as cigarettes, chewing tobacco, and e-cigarettes. If you need help quitting, ask your health care provider. Do not use street drugs or medicines that have not been prescribed to you during your pregnancy. Talk with your health care provider before taking any herbal supplements, even if you have been taking them regularly. Make sure you gain a healthy amount of weight during your pregnancy. Watch for infection. If you think that you might have an infection, get it checked right away. Make sure to tell your health care provider if you have gone into preterm labor before. This information is not intended to replace advice given to you by your health care provider. Make sure you discuss any questions you have with your health care provider. Document Revised: 04/04/2019 Document Reviewed: 05/03/2016 Elsevier Patient Education  2020 [employer].

## 2024-08-21 NOTE — Progress Notes (Addendum)
 US  33+3 wks,frank breech,anterior placenta gr 3,cx 3.1 cm,AFI 14 cm,RI .58,..61,.66,.64=70%,FHR 159 bpm,EFW 1711 g 4%,AC 15%,BPP 8/8

## 2024-08-21 NOTE — Progress Notes (Signed)
 HIGH-RISK PREGNANCY VISIT Patient name: Ellen Huffman MRN [id number]  Date of birth: [birth year] Chief Complaint:   Routine Prenatal Visit and Pregnancy Ultrasound  History of Present Illness:   Ellen Huffman is a 23 y.o. G69P0030 female at [redacted]w[redacted]d with an Estimated Date of Delivery: 10/06/24 being seen today for ongoing management of a high-risk pregnancy complicated by fetal growth restriction 4% w/ normal UAD.    Today she reports no complaints. Contractions: Not present.  .  Movement: Present. denies leaking of fluid.      03/24/2024    1:35 PM 01/18/2023   10:44 AM 12/08/2022    9:27 AM 11/14/2021    1:50 PM 08/09/2021   10:40 AM  Depression screen PHQ 2/9  Decreased Interest 1 1 0 1   Down, Depressed, Hopeless 0 1 0 1   PHQ - 2 Score 1 2 0 2   Altered sleeping 0 1 0 1   Tired, decreased energy 3 1 0 2   Change in appetite 0 1 0 2   Feeling bad or failure about yourself  0 1 0 0   Trouble concentrating 0 0 0 0   Moving slowly or fidgety/restless 0 0 0 0   Suicidal thoughts 0 0 0 0   PHQ-9 Score 4 6 0 7   Difficult doing work/chores   Not difficult at all Not difficult at all      Information is confidential and restricted. Go to Review Flowsheets to unlock data.        03/24/2024    1:35 PM 01/18/2023   10:44 AM 12/08/2022    9:28 AM 11/14/2021    1:51 PM  GAD 7 : Generalized Anxiety Score  Nervous, Anxious, on Edge 2 3 0 1  Control/stop worrying 3 3 0 1  Worry too much - different things 3 3 0 1  Trouble relaxing 1 2 0 1  Restless 0 2 0 1  Easily annoyed or irritable 2 3 2 2   Afraid - awful might happen 2 1 2 1   Total GAD 7 Score 13 17 4 8   Anxiety Difficulty   Not difficult at all Not difficult at all     Review of Systems:   Pertinent items are noted in HPI Denies abnormal vaginal discharge w/ itching/odor/irritation, headaches, visual changes, shortness of breath, chest pain, abdominal pain, severe nausea/vomiting, or problems with urination or bowel movements  unless otherwise stated above. Pertinent History Reviewed:  Reviewed past medical,surgical, social, obstetrical and family history.  Reviewed problem list, medications and allergies. Physical Assessment:   Vitals:   08/21/24 1458  BP: 132/88  Pulse: 91  Weight: 224 lb 9.6 oz (101.9 kg)  Body mass index is 36.25 kg/m.           Physical Examination:   General appearance: alert, well appearing, and in no distress  Mental status: alert, oriented to person, place, and time  Skin: warm & dry   Extremities: Edema: None    Cardiovascular: normal heart rate noted  Respiratory: normal respiratory effort, no distress  Abdomen: gravid, soft, non-tender  Pelvic: Cervical exam deferred         Fetal Status:     Movement: Present    Fetal Surveillance Testing today: US  33+3 wks,frank breech,anterior placenta gr 3,cx 3.1 cm,AFI 14 cm,RI .58,..61,.66,.64=70%,FHR 159 bpm,EFW 1711 g 4%,AC 15%,BPP 8/8   Chaperone: N/A  No results found for this or any previous visit (from the past 24 hours).  Assessment &  Plan:  High-risk pregnancy: G4P0030 at [redacted]w[redacted]d with an Estimated Date of Delivery: 10/06/24   1) FGR 4% w/ normal UAD, bpp 8/8  2) Bicornuate uterus w/ breech presentation  Meds: No orders of the defined types were placed in this encounter.   Labs/procedures today: U/S  Treatment Plan:   UAD EFW Testing Delivery  EFW/AC 3-9% Q 1-2wk Q 3wk 28w-weekly BPP 32w-add NST 38.0-39.0    Reviewed: Preterm labor symptoms and general obstetric precautions including but not limited to vaginal bleeding, contractions, leaking of fluid and fetal movement were reviewed in detail with the patient.  All questions were answered. Does have home bp cuff. Office bp cuff given: not applicable. Check bp weekly, let us  know if consistently >140 and/or >90.  Follow-up: Return for As scheduled.   Future Appointments  Date Time Provider Department Center  08/28/2024 10:45 AM [facility] - [facility]TO[facility]YN US  [facility] None   08/28/2024 11:30 AM Kizzie Suzen SAUNDERS, CNM [facility]  09/01/2024 10:10 AM [facility] NURSE [facility]  09/04/2024  1:30 PM [facility] - FTO[facility]YN US  [facility] None  09/04/2024  2:30 PM Ozan, Jennifer, DO [facility]  09/08/2024 10:10 AM [facility] NURSE [facility]  09/11/2024  3:00 PM [facility] - FTOBGYN US  [facility] None  09/11/2024  3:50 PM Cresenzo-Dishmon, Cathlean, CNM [facility]  09/15/2024 10:50 AM [facility] NURSE [facility]  09/18/2024  3:00 PM [facility] - FTO[facility]YN US  [facility] None  09/18/2024  3:50 PM Marilynn Nest, DO [facility]  09/22/2024 10:50 AM [facility] NURSE [facility]  09/29/2024 11:10 AM [facility] NURSE [facility]  10/02/2024 10:45 AM [facility] - FTOBGYN US  [facility] None  10/02/2024 11:30 AM Kizzie Suzen SAUNDERS, CNM [facility]  10/06/2024 10:50 AM [facility] NURSE [facility]    No orders of the defined types were placed in this encounter.  Suzen SAUNDERS Kizzie CNM, [facility] 08/21/2024 3:19 PM

## 2024-08-28 ENCOUNTER — Ambulatory Visit (INDEPENDENT_AMBULATORY_CARE_PROVIDER_SITE_OTHER)

## 2024-08-28 ENCOUNTER — Encounter: Payer: Self-pay | Admitting: Women's Health

## 2024-08-28 ENCOUNTER — Ambulatory Visit: Admitting: Women's Health

## 2024-08-28 VITALS — BP 133/86 | HR 81 | Wt 228.6 lb

## 2024-08-28 DIAGNOSIS — Q513 Bicornate uterus: Secondary | ICD-10-CM

## 2024-08-28 DIAGNOSIS — O365930 Maternal care for other known or suspected poor fetal growth, third trimester, not applicable or unspecified: Secondary | ICD-10-CM | POA: Diagnosis not present

## 2024-08-28 DIAGNOSIS — O321XX0 Maternal care for breech presentation, not applicable or unspecified: Secondary | ICD-10-CM | POA: Diagnosis not present

## 2024-08-28 DIAGNOSIS — Z3A34 34 weeks gestation of pregnancy: Secondary | ICD-10-CM

## 2024-08-28 DIAGNOSIS — O365920 Maternal care for other known or suspected poor fetal growth, second trimester, not applicable or unspecified: Secondary | ICD-10-CM

## 2024-08-28 DIAGNOSIS — O0992 Supervision of high risk pregnancy, unspecified, second trimester: Secondary | ICD-10-CM

## 2024-08-28 DIAGNOSIS — O0993 Supervision of high risk pregnancy, unspecified, third trimester: Secondary | ICD-10-CM

## 2024-08-28 DIAGNOSIS — Z331 Pregnant state, incidental: Secondary | ICD-10-CM

## 2024-08-28 DIAGNOSIS — Z1389 Encounter for screening for other disorder: Secondary | ICD-10-CM

## 2024-08-28 DIAGNOSIS — O0990 Supervision of high risk pregnancy, unspecified, unspecified trimester: Secondary | ICD-10-CM

## 2024-08-28 LAB — POCT URINALYSIS DIPSTICK OB
Blood, UA: NEGATIVE
Glucose, UA: NEGATIVE
Ketones, UA: NEGATIVE
Nitrite, UA: NEGATIVE
POC,PROTEIN,UA: NEGATIVE

## 2024-08-28 NOTE — Progress Notes (Signed)
 HIGH-RISK PREGNANCY VISIT Patient name: Ellen Huffman MRN [id number]  Date of birth: 04-20-2001 Chief Complaint:   Routine Prenatal Visit and Pregnancy Ultrasound  History of Present Illness:   Ellen Huffman is a 23 y.o. G27P0030 female at [redacted]w[redacted]d with an Estimated Date of Delivery: 10/06/24 being seen today for ongoing management of a high-risk pregnancy complicated by fetal growth restriction 4% w/ normal UAD.    Today she reports cramping last night, none today. Contractions: Not present.  .  Movement: Present. denies leaking of fluid.      03/24/2024    1:35 PM 01/18/2023   10:44 AM 12/08/2022    9:27 AM 11/14/2021    1:50 PM 08/09/2021   10:40 AM  Depression screen PHQ 2/9  Decreased Interest 1 1 0 1   Down, Depressed, Hopeless 0 1 0 1   PHQ - 2 Score 1 2 0 2   Altered sleeping 0 1 0 1   Tired, decreased energy 3 1 0 2   Change in appetite 0 1 0 2   Feeling bad or failure about yourself  0 1 0 0   Trouble concentrating 0 0 0 0   Moving slowly or fidgety/restless 0 0 0 0   Suicidal thoughts 0 0 0 0   PHQ-9 Score 4 6 0 7   Difficult doing work/chores   Not difficult at all Not difficult at all      Information is confidential and restricted. Go to Review Flowsheets to unlock data.        03/24/2024    1:35 PM 01/18/2023   10:44 AM 12/08/2022    9:28 AM 11/14/2021    1:51 PM  GAD 7 : Generalized Anxiety Score  Nervous, Anxious, on Edge 2 3 0 1  Control/stop worrying 3 3 0 1  Worry too much - different things 3 3 0 1  Trouble relaxing 1 2 0 1  Restless 0 2 0 1  Easily annoyed or irritable 2 3 2 2   Afraid - awful might happen 2 1 2 1   Total GAD 7 Score 13 17 4 8   Anxiety Difficulty   Not difficult at all Not difficult at all     Review of Systems:   Pertinent items are noted in HPI Denies abnormal vaginal discharge w/ itching/odor/irritation, headaches, visual changes, shortness of breath, chest pain, abdominal pain, severe nausea/vomiting, or problems with urination or  bowel movements unless otherwise stated above. Pertinent History Reviewed:  Reviewed past medical,surgical, social, obstetrical and family history.  Reviewed problem list, medications and allergies. Physical Assessment:   Vitals:   08/28/24 1120  BP: 133/86  Pulse: 81  Weight: 228 lb 9.6 oz (103.7 kg)  Body mass index is 36.9 kg/m.           Physical Examination:   General appearance: alert, well appearing, and in no distress  Mental status: alert, oriented to person, place, and time  Skin: warm & dry   Extremities: Edema: None    Cardiovascular: normal heart rate noted  Respiratory: normal respiratory effort, no distress  Abdomen: gravid, soft, non-tender  Pelvic: Cervical exam deferred         Fetal Status:     Movement: Present    Fetal Surveillance Testing today: US  34+3 wks,frank breech,anterior placenta gr 3,cx 3.3 cm,AFI 19 cm,BPP 8/8,RI .56,.64,.55,.54=44%,FHR 152 bpm,bicornuate uterus   Chaperone: N/A  Results for orders placed or performed in visit on 08/28/24 (from the past 24 hours)  POC Urinalysis Dipstick OB   Collection Time: 08/28/24 11:35 AM  Result Value Ref Range   Color, UA     Clarity, UA     Glucose, UA Negative Negative   Bilirubin, UA     Ketones, UA negative    Spec Grav, UA     Blood, UA negative    pH, UA     POC,PROTEIN,UA Negative Negative, Trace, Small (1+), Moderate (2+), Large (3+), 4+   Urobilinogen, UA     Nitrite, UA negative    Leukocytes, UA Trace (A) Negative   Appearance     Odor      Assessment & Plan:  High-risk pregnancy: G4P0030 at [redacted]w[redacted]d with an Estimated Date of Delivery: 10/06/24   1) FGR 4% w/ normal UAD, stable, bpp 8/8 today  2) Bicornuate uterus  3) Breech presentation> likely from bicornuate uterus  4) Borderline bp> check daily, if >140/90 or pre-e s/s, let us  know  Meds: No orders of the defined types were placed in this encounter.   Labs/procedures today: U/S  Treatment Plan:       UAD EFW Testing  Delivery  EFW/AC 3-9% Q 1-2wk Q 3wk 28w-weekly BPP 32w-add NST 38.0-39.0    Reviewed: Preterm labor symptoms and general obstetric precautions including but not limited to vaginal bleeding, contractions, leaking of fluid and fetal movement were reviewed in detail with the patient.  All questions were answered. Does have home bp cuff. Office bp cuff given: not applicable. Check bp daily, let us  know if consistently >140 and/or >90.  Follow-up: Return for As scheduled.   Future Appointments  Date Time Provider Department Center  09/01/2024 10:10 AM [facility] NURSE [facility]YN  09/04/2024  1:30 PM [facility] - FTOBGYN US  [facility] None  09/04/2024  2:30 PM Ozan, Jennifer, DO [facility]  09/08/2024 10:10 AM [facility] NURSE [facility]  09/11/2024  2:15 PM [facility] - FTOBGYN US  [facility] None  09/11/2024  3:10 PM Cresenzo-Dishmon, Cathlean, CNM [facility]  09/15/2024 10:50 AM [facility] NURSE [facility]  09/18/2024  3:00 PM [facility] - FTOBGYN US  [facility] None  09/18/2024  3:50 PM Marilynn Nest, DO [facility]  09/22/2024 10:50 AM [facility] NURSE [facility]  09/29/2024 11:10 AM [facility] NURSE [facility]  10/02/2024 10:45 AM [facility] - FTOBGYN US  [facility] None  10/02/2024 11:30 AM Kizzie Suzen SAUNDERS, CNM [facility]  10/06/2024 10:50 AM [facility] NURSE [facility]    Orders Placed This Encounter  Procedures   POC Urinalysis Dipstick OB   Suzen SAUNDERS Kizzie CNM, [facility] 08/28/2024 11:42 AM

## 2024-08-28 NOTE — Progress Notes (Signed)
 US  34+3 wks,frank breech,anterior placenta  gr 3,cx 3.3 cm,AFI 19 cm,BPP 8/8,RI .56,.64,.55,.54=44%,FHR 152 bpm,bicornuate uterus

## 2024-08-28 NOTE — Patient Instructions (Signed)
 Taffy, thank you for choosing our office today! We appreciate the opportunity to meet your healthcare needs. You may receive a short survey by mail, e-mail, or through [employer]. If you are happy with your care we would appreciate if you could take just a few minutes to complete the survey questions. We read all of your comments and take your feedback very seriously. Thank you again for choosing our office.  Center for [employer] Team at [facility]  [facility] at [facility] ([address] [place], [place] [address]) Entrance C, located off of [address] Free 24/7 valet parking   CLASSES: Go to [email or web address] to register for classes (childbirth, breastfeeding, waterbirth, infant CPR, daddy bootcamp, etc.)  Call the office [phone]) or go to [facility] if: You begin to have strong, frequent contractions Your water breaks.  Sometimes it is a big gush of fluid, sometimes it is just a trickle that keeps getting your panties wet or running down your legs You have vaginal bleeding.  It is normal to have a small amount of spotting if your cervix was checked.  You don't feel your baby moving like normal.  If you don't, get you something to eat and drink and lay down and focus on feeling your baby move.   If your baby is still not moving like normal, you should call the office or go to [facility].  Call the office [phone]) or go to [facility] hospital for these signs of pre-eclampsia: Severe headache that does not go away with Tylenol  Visual changes- seeing spots, double, blurred vision Pain under your right breast or upper abdomen that does not go away with Tums or heartburn medicine Nausea and/or vomiting Severe swelling in your hands, feet, and face   Tdap Vaccine It is recommended that you get the Tdap vaccine during the third trimester of EACH pregnancy to help protect your baby from getting pertussis (whooping cough) 27-36 weeks is the BEST time to do  this so that you can pass the protection on to your baby. During pregnancy is better than after pregnancy, but if you are unable to get it during pregnancy it will be offered at the hospital.  You can get this vaccine with us , at the health department, your family doctor, or some local pharmacies Everyone who will be around your baby should also be up-to-date on their vaccines before the baby comes. Adults (who are not pregnant) only need 1 dose of Tdap during adulthood.   [facility] Pediatricians/Family Doctors [facility]): [address] [place], [phone]           [facility]: [address], [phone]                [facility]): [address], [phone] (call to ask if accepting patients) [facility] Department: [address], [place], [phone]    [facility] Pediatricians/Family Doctors [facility]): [phone] [address], [phone] [facility]: [address] [place], [phone] [facility]: [address], [phone]  [facility] Doctors  [facility]): [phone] [facility]: [address], [phone]   [facility] Doctors [facility]: [address] [phone]  [facility] Family Doctors  [facility]: [phone], [phone]  Home Blood Pressure Monitoring for Patients   Your provider has recommended that you check your  blood pressure (BP) at least once a week at home. If you do not have a blood pressure cuff at home, one will be provided for you. Contact your provider if you have not received your monitor within 1 week.   Helpful Tips for Accurate Home Blood Pressure Checks  Don't smoke, exercise, or drink caffeine 30 minutes before checking your BP Use the restroom before checking your BP (a full bladder can raise your  pressure) Relax in a comfortable upright chair Feet on the ground Left arm resting comfortably on a flat surface at the level of your heart Legs uncrossed Back supported Sit quietly and don't talk Place the cuff on your bare arm Adjust snuggly, so that only two fingertips can fit between your skin and the top of the cuff Check 2 readings separated by at least one minute Keep a log of your BP readings For a visual, please reference this diagram: http://ccnc.care/bpdiagram  Provider Name: [facility]     Phone: [phone]  Zone 1: ALL CLEAR  Continue to monitor your symptoms:  BP reading is less than 140 (top number) or less than 90 (bottom number)  No right upper stomach pain No headaches or seeing spots No feeling nauseated or throwing up No swelling in face and hands  Zone 2: CAUTION Call your doctor's office for any of the following:  BP reading is greater than 140 (top number) or greater than 90 (bottom number)  Stomach pain under your ribs in the middle or right side Headaches or seeing spots Feeling nauseated or throwing up Swelling in face and hands  Zone 3: EMERGENCY  Seek immediate medical care if you have any of the following:  BP reading is greater than160 (top number) or greater than 110 (bottom number) Severe headaches not improving with Tylenol  Serious difficulty catching your breath Any worsening symptoms from Zone 2  Preterm Labor and Birth Information  The normal length of a pregnancy is 39-41 weeks. Preterm labor is when labor starts before 37 completed weeks of pregnancy. What are the risk factors for preterm labor? Preterm labor is more likely to occur in women who: Have certain infections during pregnancy such as a bladder infection, sexually transmitted infection, or infection inside the uterus (chorioamnionitis). Have a shorter-than-normal cervix. Have gone into preterm labor before. Have had surgery on their cervix. Are younger than age 33  or older than age 59. Are African American. Are pregnant with twins or multiple babies (multiple gestation). Take street drugs or smoke while pregnant. Do not gain enough weight while pregnant. Became pregnant shortly after having been pregnant. What are the symptoms of preterm labor? Symptoms of preterm labor include: Cramps similar to those that can happen during a menstrual period. The cramps may happen with diarrhea. Pain in the abdomen or lower back. Regular uterine contractions that may feel like tightening of the abdomen. A feeling of increased pressure in the pelvis. Increased watery or bloody mucus discharge from the vagina. Water breaking (ruptured amniotic sac). Why is it important to recognize signs of preterm labor? It is important to recognize signs of preterm labor because babies who are born prematurely may not be fully developed. This can put them at an increased risk for: Long-term (chronic) heart and lung problems. Difficulty immediately after birth with regulating body systems, including blood sugar, body temperature, heart rate, and breathing rate. Bleeding in the brain. Cerebral palsy. Learning difficulties. Death. These risks are highest for babies who are born before 34 weeks  of pregnancy. How is preterm labor treated? Treatment depends on the length of your pregnancy, your condition, and the health of your baby. It may involve: Having a stitch (suture) placed in your cervix to prevent your cervix from opening too early (cerclage). Taking or being given medicines, such as: Hormone medicines. These may be given early in pregnancy to help support the pregnancy. Medicine to stop contractions. Medicines to help mature the baby's lungs. These may be prescribed if the risk of delivery is high. Medicines to prevent your baby from developing cerebral palsy. If the labor happens before 34 weeks of pregnancy, you may need to stay in the hospital. What should I do if I  think I am in preterm labor? If you think that you are going into preterm labor, call your health care provider right away. How can I prevent preterm labor in future pregnancies? To increase your chance of having a full-term pregnancy: Do not use any tobacco products, such as cigarettes, chewing tobacco, and e-cigarettes. If you need help quitting, ask your health care provider. Do not use street drugs or medicines that have not been prescribed to you during your pregnancy. Talk with your health care provider before taking any herbal supplements, even if you have been taking them regularly. Make sure you gain a healthy amount of weight during your pregnancy. Watch for infection. If you think that you might have an infection, get it checked right away. Make sure to tell your health care provider if you have gone into preterm labor before. This information is not intended to replace advice given to you by your health care provider. Make sure you discuss any questions you have with your health care provider. Document Revised: 04/04/2019 Document Reviewed: 05/03/2016 Elsevier Patient Education  2020 [employer].

## 2024-08-29 ENCOUNTER — Inpatient Hospital Stay (HOSPITAL_COMMUNITY)
Admission: AD | Admit: 2024-08-29 | Discharge: 2024-08-29 | Disposition: A | Discharge status: Home / Self Care | Attending: Obstetrics and Gynecology | Admitting: Obstetrics and Gynecology

## 2024-08-29 ENCOUNTER — Encounter (HOSPITAL_COMMUNITY): Payer: Self-pay | Admitting: Obstetrics and Gynecology

## 2024-08-29 DIAGNOSIS — R519 Headache, unspecified: Secondary | ICD-10-CM

## 2024-08-29 DIAGNOSIS — O26893 Other specified pregnancy related conditions, third trimester: Secondary | ICD-10-CM | POA: Diagnosis not present

## 2024-08-29 DIAGNOSIS — R030 Elevated blood-pressure reading, without diagnosis of hypertension: Secondary | ICD-10-CM | POA: Diagnosis not present

## 2024-08-29 DIAGNOSIS — O365930 Maternal care for other known or suspected poor fetal growth, third trimester, not applicable or unspecified: Secondary | ICD-10-CM | POA: Insufficient documentation

## 2024-08-29 DIAGNOSIS — Z3A34 34 weeks gestation of pregnancy: Secondary | ICD-10-CM | POA: Insufficient documentation

## 2024-08-29 LAB — COMPREHENSIVE METABOLIC PANEL WITH GFR
ALT: 19 U/L (ref 0–44)
AST: 16 U/L (ref 15–41)
Albumin: 3.2 g/dL — ABNORMAL LOW (ref 3.5–5.0)
Alkaline Phosphatase: 60 U/L (ref 38–126)
Anion gap: 14 (ref 5–15)
BUN: 6 mg/dL (ref 6–20)
CO2: 20 mmol/L — ABNORMAL LOW (ref 22–32)
Calcium: 9.6 mg/dL (ref 8.9–10.3)
Chloride: 104 mmol/L (ref 98–111)
Creatinine, Ser: 0.48 mg/dL (ref 0.44–1.00)
GFR, Estimated: >60 mL/min (ref >60)
Glucose, Bld: 102 mg/dL — ABNORMAL HIGH (ref 70–99)
Potassium: 3.7 mmol/L (ref 3.5–5.1)
Sodium: 138 mmol/L (ref 135–145)
Total Bilirubin: 0.5 mg/dL (ref 0.0–1.2)
Total Protein: 6.9 g/dL (ref 6.5–8.1)

## 2024-08-29 LAB — PROTEIN / CREATININE RATIO, URINE
Creatinine, Urine: 32 mg/dL
Total Protein, Urine: <6 mg/dL

## 2024-08-29 LAB — CBC
HCT: 37.7 % (ref 36.0–46.0)
Hemoglobin: 12.8 g/dL (ref 12.0–15.0)
MCH: 31.8 pg (ref 26.0–34.0)
MCHC: 34 g/dL (ref 30.0–36.0)
MCV: 93.5 fL (ref 80.0–100.0)
Platelets: 211 K/uL (ref 150–400)
RBC: 4.03 MIL/uL (ref 3.87–5.11)
RDW: 13.2 % (ref 11.5–15.5)
WBC: 10.4 K/uL (ref 4.0–10.5)
nRBC: 0 % (ref 0.0–0.2)

## 2024-08-29 MED ORDER — ACETAMINOPHEN 500 MG PO TABS
1000.0000 mg | ORAL_TABLET | Freq: Once | ORAL | Status: AC
  Administered 2024-08-29: 1000 mg via ORAL
  Filled 2024-08-29: qty 2

## 2024-08-29 NOTE — Discharge Instructions (Signed)
 Ellen Huffman,  You came to the MAU (Maternity Assessment Unit) today for headache and elevated blood pressure readings. Your blood pressure was high-normal in the MAU (meeting goal of <140/90) and your baby looked good on the monitor. Additionally, your labs look good. This is all reassuring that your blood pressures have not been dangerously/damagingly high. I'd recommend you check your blood pressure at home just 1-2 times a week. Make sure you check your blood pressure after you have been at rest, ideally with no screens/phones, for ~5 minutes.  For your headache, we gave you Tylenol  1000 mg once. You can continue to use Tylenol  for headache as needed; do not take more than 4000mg /4g in a 24 hour period.  Reasons to return to the MAU: - Your headache returns and is not managed with Tylenol , especially if it occurs with vision changes or upper abdominal pain - You start to experience abdominal pain/contractions (squeezing sensation) - You experience vaginal bleeding or leakage of fluids - You do not feel your baby moving as much or at all - You develop a fever (> 100.18F or 38C)  Thank you for allowing me to be a part of your care! Alan Flies, MD [facility], PGY1 [facility] [address] Entr[address] (off [place], [place] [address]

## 2024-08-29 NOTE — MAU Note (Signed)
 Ellen Huffman is a 23 y.o. at [redacted]w[redacted]d here in MAU reporting: after lunch started getting a HA. Has gotten worse,feels like head is going to explode.  Did not take anything for it. Checked BP was 140/86, checked an hour later it was 138/82.  Called office, was told to come in. Denies visual changes, epigastric pain or increase in swelling.  Denies bleeding or LOF.  Reports baby is not moving as much as usual.  Onset of complaint: early afternoon Pain score: 8 Vitals:   08/29/24 1711  BP: 139/78  Pulse: 82  Resp: 16  Temp: 98.6 F (37 C)  SpO2: 100%     FHT:150, movement noted by triage nurse Lab orders placed from triage:  urine collected

## 2024-08-29 NOTE — MAU Provider Note (Addendum)
 Chief Complaint:  Headache   HPI   Ellen Huffman is a 23 y.o. G4P0030 at [redacted]w[redacted]d who presents to maternity admissions for elevated blood pressures and headache. Reports highest home BP reading of 140/86. Headache started this afternoon.  Headache described as pressure in her front/temple region; slightly worsened over the past couple hours, now a lot of pressure behind eyes. No current visual changes, does not think she has had vision changes (blurry vision, dark spots in vision). Denies chest pain, palpitations, shortness of breath, lightheadedness. Family history of gestational hypertension in mom (no preeclampsia).  Had been taking aspirin ; more recently has been forgetting more often (taking 81 mg daily).  Seen yesterday 9/4 by outpatient OB at [facility] and had BPP 8/8. BP was elevated to 133/86 at that time. Urinalysis dipstick with trace leukocytes, negative nitrites, no blood or ketones.  Contractions: Some cramping a few times a day, not changed  Vaginal bleeding: None  Fluid leakage: None  Fetal movement: Still moving, maybe decreased movement this afternoon but now back to normal  Patient's mom is present for support.  Pregnancy Course: Receives care at [facility] for [facility] . Prenatal records reviewed. Has IUGR at 4%; known bicornate uterus. Baby in breech position as of US  done 9/4.  Past Medical History:  Diagnosis Date   ADHD (attention deficit hyperactivity disorder)    Allergy    Anxiety    Bipolar disorder (HCC)    Depression    Mental disorder    Overdose 12/25/2013   Seizures (HCC) 2022   drug withdrawl   OB History  Gravida Para Term Preterm AB Living  4    3   SAB IAB Ectopic Multiple Live Births  3        # Outcome Date GA Lbr Len/2nd Weight Sex Type Anes PTL Lv  4 Current           3 SAB 05/2023          2 SAB 11/2022 [redacted]w[redacted]d    SAB     1 SAB 09/27/21 [redacted]w[redacted]d          Past Surgical History:  Procedure Laterality Date   DILATION AND  CURETTAGE OF UTERUS N/A 09/27/2021   Procedure: DILATATION AND CURETTAGE WITH FROZEN WITH ULTRA SOUND;  Surgeon: Izell Harari, MD;  Location: [facility] OR;  Service: Gynecology;  Laterality: N/A;   DILATION AND EVACUATION N/A 12/27/2022   Procedure: DILATATION AND EVACUATION WITH ANORA GENETIC TESTING;  Surgeon: Izell Harari, MD;  Location: [facility] OR;  Service: Gynecology;  Laterality: N/A;   Family History  Problem Relation Age of Onset   Lupus Mother    Hyperlipidemia Father    Migraines Neg Hx    Parkinsonism Neg Hx    Seizures Neg Hx    ADD / ADHD Neg Hx    Bipolar disorder Neg Hx    Social History   Tobacco Use   Smoking status: Former    Types: Cigarettes    Passive exposure: Never   Smokeless tobacco: Never   Tobacco comments:    Quit Jan 2023    quit vaping with preg  Vaping Use   Vaping status: Former   Substances: Nicotine , Flavoring  Substance Use Topics   Alcohol use: Not Currently    Comment: drinks on the weekends   Drug use: Not Currently    Types: Marijuana, Cocaine   Allergies  Allergen Reactions   Penicillins Rash   Medications Prior to Admission  Medication Sig Dispense Refill Last Dose/Taking   aspirin  EC 81 MG tablet Take 2 tablets (162 mg total) by mouth daily. 60 tablet 6 Past Week   Prenatal Vit-Fe Fumarate-FA (M-NATAL PLUS ) 27-1 MG TABS Take 1 tablet by mouth daily. 30 tablet 11 08/29/2024   Blood Pressure Monitor MISC For regular home bp monitoring during pregnancy 1 each 0     I have reviewed patient's Past Medical Hx, Surgical Hx, Family Hx, Social Hx, medications and allergies.   ROS  Pertinent items noted in HPI and remainder of comprehensive ROS otherwise negative.   PHYSICAL EXAM  Patient Vitals for the past 24 hrs:  BP Temp Temp src Pulse Resp SpO2 Height Weight  08/29/24 1831 132/78 -- -- 86 -- -- -- --  08/29/24 1830 -- -- -- -- -- 98 % -- --  08/29/24 1825 -- -- -- -- -- 98 % -- --  08/29/24 1820 -- -- -- -- -- 99 % -- --  08/29/24  1816 135/81 -- -- 85 -- -- -- --  08/29/24 1815 -- -- -- -- -- 99 % -- --  08/29/24 1810 -- -- -- -- -- 99 % -- --  08/29/24 1805 -- -- -- -- -- 99 % -- --  08/29/24 1801 134/85 -- -- 86 -- -- -- --  08/29/24 1800 -- -- -- -- -- 99 % -- --  08/29/24 1755 -- -- -- -- -- 99 % -- --  08/29/24 1750 -- -- -- -- -- 99 % -- --  08/29/24 1746 137/88 -- -- 91 -- -- -- --  08/29/24 1745 -- -- -- -- -- 99 % -- --  08/29/24 1740 -- -- -- -- -- 99 % -- --  08/29/24 1739 134/80 -- -- 83 -- -- -- --  08/29/24 1711 139/78 98.6 F (37 C) Oral 82 16 100 % 5' 6 (1.676 m) 103.7 kg    Constitutional: Well-developed, well-nourished female in no acute distress.  HEENT: atraumatic, normocephalic. Neck has normal ROM. EOM intact. Cardiovascular: normal rate & rhythm, warm and well-perfused Respiratory: normal effort, no problems with respiration noted GI: Abd soft, non-tender, gravid MSK: Extremities nontender, no edema, normal ROM Skin: warm and dry. Acyanotic, no jaundice or pallor. Neurologic: Alert and oriented x 4. No abnormal coordination. Psychiatric: Normal mood. Speech not slurred, not rapid/pressured. Patient is cooperative.  Fetal Tracing: Baseline FHR: 140 per minute Fetal heart variability: moderate Fetal Heart Rate accelerations: yes Fetal Heart Rate decelerations: none Fetal Non-stress Test: Category I (reactive) Toco: No contractions  Labs: Results for orders placed or performed during the hospital encounter of 08/29/24 (from the past 24 hours)  Protein / creatinine ratio, urine     Status: None   Collection Time: 08/29/24  5:15 PM  Result Value Ref Range   Creatinine, Urine 32 mg/dL   Total Protein, Urine <6 mg/dL   Protein Creatinine Ratio        0.00 - 0.15 mg/mg[Cre]  Comprehensive metabolic panel     Status: Abnormal   Collection Time: 08/29/24  5:29 PM  Result Value Ref Range   Sodium 138 135 - 145 mmol/L   Potassium 3.7 3.5 - 5.1 mmol/L   Chloride 104 98 - 111 mmol/L    CO2 20 (L) 22 - 32 mmol/L   Glucose, Bld 102 (H) 70 - 99 mg/dL   BUN 6 6 - 20 mg/dL   Creatinine, Ser 9.51 0.44 - 1.00 mg/dL   Calcium 9.6 8.9 -  10.3 mg/dL   Total Protein 6.9 6.5 - 8.1 g/dL   Albumin 3.2 (L) 3.5 - 5.0 g/dL   AST 16 15 - 41 U/L   ALT 19 0 - 44 U/L   Alkaline Phosphatase 60 38 - 126 U/L   Total Bilirubin 0.5 0.0 - 1.2 mg/dL   GFR, Estimated >39 >39 mL/min   Anion gap 14 5 - 15  CBC     Status: None   Collection Time: 08/29/24  5:29 PM  Result Value Ref Range   WBC 10.4 4.0 - 10.5 K/uL   RBC 4.03 3.87 - 5.11 MIL/uL   Hemoglobin 12.8 12.0 - 15.0 g/dL   HCT 62.2 63.9 - 53.9 %   MCV 93.5 80.0 - 100.0 fL   MCH 31.8 26.0 - 34.0 pg   MCHC 34.0 30.0 - 36.0 g/dL   RDW 86.7 88.4 - 84.4 %   Platelets 211 150 - 400 K/uL   nRBC 0.0 0.0 - 0.2 %    Imaging:  No results found.   MDM & MAU COURSE  MDM: High  MAU Course: Differential diagnosis considered for headache includes but is not limited to: preeclampsia, tension headache, cluster, trauma, concussion, migraine, CVA/SAH, viral syndrome, acute angle closure glaucoma, CO toxicity, temporal arteritis, meningitis, methanol use   Differential diagnosis considered for elevated blood pressure includes but is not limited to: high risk conditions like preeclampsia or gestational hypertension; chronic hypertension; transient spurious elevated blood pressure  Pt reports BP elevated to 140/86 at home; BP in the 130s/80s here in the MAU. No elevated readings (>140/90) in the MAU. Baby looked good on the monitor (Cat 1) throughout stay.  Preeclampsia workup ordered including CMP, CBC, urine Protein/Cr. CBC unremarkable. CMP without elevated liver enzymes, creatinine. Urine Protein/Cr ratio unable to calculate as low protein; when calculated with Cr of 32 and protein of 5, ratio of 0.2; normal.  No concern for preeclampsia, gestational hypertension at this time.  Given tylenol  1000 mg once for headache with improvement. Suspect  tension headache. Encouraged to use tylenol  prn for headache. Return precautions discussed.   Orders Placed This Encounter  Procedures   Comprehensive metabolic panel   CBC   Protein / creatinine ratio, urine   Discharge patient   Meds ordered this encounter  Medications   acetaminophen  (TYLENOL ) tablet 1,000 mg    ASSESSMENT   1. Elevated blood pressure affecting pregnancy in third trimester, antepartum   2. Acute nonintractable headache, unspecified headache type   3. [redacted] weeks gestation of pregnancy     PLAN  Discharge home in stable condition with return precautions.      Allergies as of 08/29/2024       Reactions   Penicillins Rash        Medication List     TAKE these medications    aspirin  EC 81 MG tablet Take 2 tablets (162 mg total) by mouth daily.   Blood Pressure Monitor Misc For regular home bp monitoring during pregnancy   M-Natal Plus  27-1 MG Tabs Take 1 tablet by mouth daily.        Alan Flies, MD    Midwife Attestation:  I personally saw and evaluated the patient, performing the key elements of the service. I developed and verified the management plan that is described in the resident's/student's note, and I agree with the content with my edits above. VSS, HRR&R, Resp unlabored, Legs neg.    Olam Boards, CNM 7:19 PM

## 2024-09-01 ENCOUNTER — Ambulatory Visit: Admitting: *Deleted

## 2024-09-01 VITALS — BP 122/88 | HR 88

## 2024-09-01 DIAGNOSIS — Z3A35 35 weeks gestation of pregnancy: Secondary | ICD-10-CM

## 2024-09-01 DIAGNOSIS — O365930 Maternal care for other known or suspected poor fetal growth, third trimester, not applicable or unspecified: Secondary | ICD-10-CM

## 2024-09-01 NOTE — Progress Notes (Signed)
   NURSE VISIT- NST  SUBJECTIVE:  Ellen Huffman is a 23 y.o. G57P0030 female at [redacted]w[redacted]d, here for a NST for pregnancy complicated by FGR.  She reports active fetal movement, contractions: none, vaginal bleeding: none, membranes: intact.   OBJECTIVE:  BP 122/88   Pulse 88   LMP 12/15/2023   Appears well, no apparent distress  No results found for this or any previous visit (from the past 24 hours).  NST: FHR baseline 135 bpm, Variability: moderate, Accelerations:present, Decelerations:  Absent= Cat 1/reactive Toco: none   ASSESSMENT: G4P0030 at [redacted]w[redacted]d with FGR NST reactive  PLAN: EFM strip reviewed by Dr. Ozan   Recommendations: keep next appointment as scheduled    Rutherford Rover  09/01/2024 10:31 AM

## 2024-09-04 ENCOUNTER — Other Ambulatory Visit

## 2024-09-04 ENCOUNTER — Ambulatory Visit: Admitting: Obstetrics & Gynecology

## 2024-09-04 ENCOUNTER — Encounter: Payer: Self-pay | Admitting: Obstetrics & Gynecology

## 2024-09-04 VITALS — BP 130/85 | HR 91 | Wt 229.2 lb

## 2024-09-04 DIAGNOSIS — O365930 Maternal care for other known or suspected poor fetal growth, third trimester, not applicable or unspecified: Secondary | ICD-10-CM

## 2024-09-04 DIAGNOSIS — O0990 Supervision of high risk pregnancy, unspecified, unspecified trimester: Secondary | ICD-10-CM

## 2024-09-04 DIAGNOSIS — Z3A36 36 weeks gestation of pregnancy: Secondary | ICD-10-CM

## 2024-09-04 DIAGNOSIS — Z3A35 35 weeks gestation of pregnancy: Secondary | ICD-10-CM

## 2024-09-04 DIAGNOSIS — O365920 Maternal care for other known or suspected poor fetal growth, second trimester, not applicable or unspecified: Secondary | ICD-10-CM

## 2024-09-04 DIAGNOSIS — O0992 Supervision of high risk pregnancy, unspecified, second trimester: Secondary | ICD-10-CM

## 2024-09-04 DIAGNOSIS — O34593 Maternal care for other abnormalities of gravid uterus, third trimester: Secondary | ICD-10-CM

## 2024-09-04 DIAGNOSIS — O321XX0 Maternal care for breech presentation, not applicable or unspecified: Secondary | ICD-10-CM

## 2024-09-04 NOTE — Progress Notes (Signed)
 US  35+3 wks,complete breech,FHR 148 bpm,AFI 19 cm,anterior placenta gr 3,BPP 8/8,RI .49,.50,.39=8%

## 2024-09-04 NOTE — Progress Notes (Signed)
 HIGH-RISK PREGNANCY VISIT Patient name: Ellen Huffman MRN [id number]  Date of birth: February 02, 2001 Chief Complaint:   Routine Prenatal Visit  History of Present Illness:   Ellen Huffman is a 23 y.o. G45P0030 female at [redacted]w[redacted]d with an Estimated Date of Delivery: 10/06/24 being seen today for ongoing management of a high-risk pregnancy complicated by:  -FGR normal dopplers -Bicornuate uterus -Fetal malpresentation  Today she reports no complaints.   Contractions: Irritability. Vag. Bleeding: None.  Movement: Present. denies leaking of fluid.      03/24/2024    1:35 PM 01/18/2023   10:44 AM 12/08/2022    9:27 AM 11/14/2021    1:50 PM 08/09/2021   10:40 AM  Depression screen PHQ 2/9  Decreased Interest 1 1 0 1   Down, Depressed, Hopeless 0 1 0 1   PHQ - 2 Score 1 2 0 2   Altered sleeping 0 1 0 1   Tired, decreased energy 3 1 0 2   Change in appetite 0 1 0 2   Feeling bad or failure about yourself  0 1 0 0   Trouble concentrating 0 0 0 0   Moving slowly or fidgety/restless 0 0 0 0   Suicidal thoughts 0 0 0 0   PHQ-9 Score 4 6 0 7   Difficult doing work/chores   Not difficult at all Not difficult at all      Information is confidential and restricted. Go to Review Flowsheets to unlock data.     Current Outpatient Medications  Medication Instructions   aspirin  EC 162 mg, Oral, Daily   Blood Pressure Monitor MISC For regular home bp monitoring during pregnancy   Prenatal Vit-Fe Fumarate-FA (M-NATAL PLUS ) 27-1 MG TABS 1 tablet, Oral, Daily     Review of Systems:   Pertinent items are noted in HPI Denies abnormal vaginal discharge w/ itching/odor/irritation, headaches, visual changes, shortness of breath, chest pain, abdominal pain, severe nausea/vomiting, or problems with urination or bowel movements unless otherwise stated above. Pertinent History Reviewed:  Reviewed past medical,surgical, social, obstetrical and family history.  Reviewed problem list, medications and  allergies. Physical Assessment:   Vitals:   09/04/24 1427  BP: 130/85  Pulse: 91  Weight: 229 lb 3.2 oz (104 kg)  Body mass index is 36.99 kg/m.           Physical Examination:   General appearance: alert, well appearing, and in no distress  Mental status: normal mood, behavior, speech, dress, motor activity, and thought processes  Skin: warm & dry   Extremities:      Cardiovascular: normal heart rate noted  Respiratory: normal respiratory effort, no distress  Abdomen: gravid, soft, non-tender  Pelvic: Cervical exam deferred         Fetal Status:     Movement: Present    Fetal Surveillance Testing today: complete breech,FHR 148 bpm,AFI 19 cm,anterior placenta gr 3,BPP 8/8,RI .49,.50,.39=8%    Chaperone: N/A    No results found for this or any previous visit (from the past 24 hours).   Assessment & Plan:  High-risk pregnancy: G4P0030 at [redacted]w[redacted]d with an Estimated Date of Delivery: 10/06/24   1) FGR -BPP 8/8, normal dopplers - Continue twice-weekly testing - Discussed that IOL depended upon growth and Dopplers.  Current plan would be to deliver between 38-39 weeks  2) Bicornuate uterus, Breech presentation - Discussed fetal malpresentation - Discussed management including ECV or primary C-section - Questions and concerns were addressed.  Reviewed risk benefit of each  option.  Patient wishes to t review on her own and will let us  know  Meds: No orders of the defined types were placed in this encounter.   Labs/procedures today: BPP  Treatment Plan: Routine care and as outlined above  Reviewed: Preterm labor symptoms and general obstetric precautions including but not limited to vaginal bleeding, contractions, leaking of fluid and fetal movement were reviewed in detail with the patient.  All questions were answered.   Follow-up: Return for twice weekly as scheduled.   Future Appointments  Date Time Provider Department Center  09/08/2024 10:10 AM [facility] NURSE [facility]  09/11/2024  2:15 PM [facility] US  [facility] None  09/11/2024  3:10 PM Cresenzo-Dishmon, Alen[place], CNM [facility]  09/15/2024 10:50 AM [facility] NURSE [facility]  09/18/2024  3:00 PM [facility] US  [facility] None  09/18/2024  3:50 PM Marilynn Nest, DO [facility]  09/22/2024 10:50 AM [facility] NURSE [facility]  09/29/2024 11:10 AM [facility] NURSE [facility]  10/02/2024 10:45 AM [facility] US  [facility] None  10/02/2024 11:30 AM Kizzie Suzen SAUNDERS, CNM [facility]  10/06/2024 10:50 AM [facility] NURSE [facility]    No orders of the defined types were placed in this encounter.   Orilla Templeman, DO Attending Obstetrician & Gynecologist, [facility] for [employer], [facility]

## 2024-09-05 ENCOUNTER — Encounter: Payer: Self-pay | Admitting: Obstetrics & Gynecology

## 2024-09-08 ENCOUNTER — Ambulatory Visit: Admitting: *Deleted

## 2024-09-08 VITALS — BP 135/82 | HR 92

## 2024-09-08 DIAGNOSIS — O365930 Maternal care for other known or suspected poor fetal growth, third trimester, not applicable or unspecified: Secondary | ICD-10-CM | POA: Diagnosis not present

## 2024-09-08 DIAGNOSIS — Z3A36 36 weeks gestation of pregnancy: Secondary | ICD-10-CM

## 2024-09-08 DIAGNOSIS — O0990 Supervision of high risk pregnancy, unspecified, unspecified trimester: Secondary | ICD-10-CM

## 2024-09-08 NOTE — Progress Notes (Signed)
   NURSE VISIT- NST  SUBJECTIVE:  Ellen Huffman is a 23 y.o. G33P0030 female at [redacted]w[redacted]d, here for a NST for pregnancy complicated by FGR.  She reports active fetal movement, contractions: none, vaginal bleeding: none, membranes: intact.   OBJECTIVE:  BP 135/82   Pulse 92   LMP 12/15/2023   Appears well, no apparent distress  No results found for this or any previous visit (from the past 24 hours).  NST: FHR baseline 145 bpm, Variability: moderate, Accelerations:present, Decelerations:  Absent= Cat 1/reactive Toco: none   ASSESSMENT: G4P0030 at 109w0d with FGR NST reactive  PLAN: EFM strip reviewed by Dr. Ozan   Recommendations: keep next appointment as scheduled    Alan LITTIE Fischer  09/08/2024 10:26 AM

## 2024-09-09 ENCOUNTER — Other Ambulatory Visit: Payer: Self-pay | Admitting: Obstetrics & Gynecology

## 2024-09-09 DIAGNOSIS — O0990 Supervision of high risk pregnancy, unspecified, unspecified trimester: Secondary | ICD-10-CM

## 2024-09-09 DIAGNOSIS — O320XX0 Maternal care for unstable lie, not applicable or unspecified: Secondary | ICD-10-CM

## 2024-09-09 DIAGNOSIS — O365930 Maternal care for other known or suspected poor fetal growth, third trimester, not applicable or unspecified: Secondary | ICD-10-CM

## 2024-09-09 NOTE — Progress Notes (Unsigned)
-  order placed for C_section @ 38wks due to FGR

## 2024-09-11 ENCOUNTER — Encounter: Payer: Self-pay | Admitting: *Deleted

## 2024-09-11 ENCOUNTER — Other Ambulatory Visit (HOSPITAL_COMMUNITY)
Admission: RE | Admit: 2024-09-11 | Discharge: 2024-09-11 | Disposition: A | Discharge status: Home / Self Care | Source: Ambulatory Visit | Attending: Women's Health | Admitting: Women's Health

## 2024-09-11 ENCOUNTER — Other Ambulatory Visit (INDEPENDENT_AMBULATORY_CARE_PROVIDER_SITE_OTHER)

## 2024-09-11 ENCOUNTER — Ambulatory Visit: Admitting: Advanced Practice Midwife

## 2024-09-11 VITALS — BP 132/85 | HR 77 | Wt 233.0 lb

## 2024-09-11 DIAGNOSIS — Q513 Bicornate uterus: Secondary | ICD-10-CM | POA: Diagnosis not present

## 2024-09-11 DIAGNOSIS — Z3A36 36 weeks gestation of pregnancy: Secondary | ICD-10-CM

## 2024-09-11 DIAGNOSIS — O3403 Maternal care for unspecified congenital malformation of uterus, third trimester: Secondary | ICD-10-CM

## 2024-09-11 DIAGNOSIS — O0993 Supervision of high risk pregnancy, unspecified, third trimester: Secondary | ICD-10-CM

## 2024-09-11 DIAGNOSIS — O321XX0 Maternal care for breech presentation, not applicable or unspecified: Secondary | ICD-10-CM | POA: Diagnosis not present

## 2024-09-11 DIAGNOSIS — O0990 Supervision of high risk pregnancy, unspecified, unspecified trimester: Secondary | ICD-10-CM | POA: Insufficient documentation

## 2024-09-11 DIAGNOSIS — O365930 Maternal care for other known or suspected poor fetal growth, third trimester, not applicable or unspecified: Secondary | ICD-10-CM

## 2024-09-11 DIAGNOSIS — O365920 Maternal care for other known or suspected poor fetal growth, second trimester, not applicable or unspecified: Secondary | ICD-10-CM

## 2024-09-11 DIAGNOSIS — O0992 Supervision of high risk pregnancy, unspecified, second trimester: Secondary | ICD-10-CM

## 2024-09-11 NOTE — Progress Notes (Signed)
 US  36+3 wks,complete breech,anterior placenta gr 3,FHR 139 bpm,BPP 8/8,RI .79,.76,.72.73=35%,EFW 2525 g 16%,limited because of fetal position

## 2024-09-11 NOTE — Patient Instructions (Signed)
 Ellen Huffman  09/11/2024   Your procedure is scheduled on:  09/22/2024  Arrive at 1015 at Entrance C on [address] at [facility], [facility], [facility]  and [employer]. You are invited to use the FREE valet parking or use the Visitor's parking deck.  Pick up the phone at the desk and dial  [phone].  Call this number if you have problems the morning of surgery: [phone]  Remember:   Do not eat food:(After Midnight) Desps de medianoche.  You may drink clear liquids until  __0815___.  Clear liquids means a liquid you can see thru.  It can have color such as Cola or Kool aid.  Tea is OK and coffee as long as no milk or creamer of any kind.  Take these medicines the morning of surgery with A SIP OF WATER:  none   Do not wear jewelry, make-up or nail polish.  Do not wear lotions, powders, or perfumes. Do not wear deodorant.  Do not shave 48 hours prior to surgery.  Do not bring valuables to the hospital.  [facility] is not   responsible for any belongings or valuables brought to the hospital.  Contacts, dentures or bridgework may not be worn into surgery.  Leave suitcase in the car. After surgery it may be brought to your room.  For patients admitted to the hospital, checkout time is 11:00 AM the day of              discharge.      Please read over the following fact sheets that you were given:     Preparing for Surgery

## 2024-09-11 NOTE — Progress Notes (Addendum)
 LOW-RISK PREGNANCY VISIT Patient name: Ellen Huffman MRN [id number]  Date of birth: [birth year] Chief Complaint:   Routine Prenatal Visit  History of Present Illness:   Ellen Huffman is a 23 y.o. G68P0030 female at [redacted]w[redacted]d with an Estimated Date of Delivery: 10/06/24 being seen today for ongoing management of a high-risk pregnancy.  Today she reports no complaints. Contractions: Not present. Vag. Bleeding: None.  Movement: Present. Denies leaking of fluid. Review of Systems:   Pertinent items are noted in HPI Denies abnormal vaginal discharge w/ itching/odor/irritation, headaches, visual changes, shortness of breath, chest pain, abdominal pain, severe nausea/vomiting, or problems with urination or bowel movements unless otherwise stated above. Pertinent History Reviewed:  Reviewed past medical,surgical, social, obstetrical and family history.  Reviewed problem list, medications and allergies. Physical Assessment:     Vitals:   09/11/24 1503  BP: 132/85  Pulse: 77  Weight: 105.7 kg  Body mass index is 37.61 kg/m.        Physical Examination:   General appearance: Well appearing, and in no distress  Mental status: Alert, oriented to person, place, and time  Skin: Warm & dry  Cardiovascular: Normal heart rate noted  Respiratory: Normal respiratory effort, no distress  Abdomen: Soft, gravid, nontender  Pelvic: declined         Extremities:  No edema Chaperone: Sherrell Ely, CNM  Fetal Status: BPP 8/8, FHR 139     Movement: Present     No results found for this or any previous visit (from the past 24 hours).  Assessment & Plan:   Pregnancy: G4P0030 at [redacted]w[redacted]d 1. Supervision of high risk pregnancy, antepartum - Feeling regular, vigorous fetal movement  2. [redacted] weeks gestation of pregnancy (Primary) - Strep Gp B NAA+Rflx - Cervicovaginal ancillary only  3. Poor fetal growth affecting management of mother in third trimester, single or unspecified fetus - US  today shows EFW  16%, AC 40% with normal doppler flow > results reviewed with Dr. Ozan who states growth restriction is resolved, recommends discontinuing antenatal testing and c-section date changed to 39 weeks - Reviewed results and change in delivery recommendation with patient  4. Bicornuate uterus - Breech presentation confirmed on US  today - Previously declined ECV, planning c-section  Meds: No orders of the defined types were placed in this encounter.  Labs/procedures today: BPP, doppler studies, follow-up growth  Plan:  Continue routine obstetrical care weekly  Next visit: in-person    Reviewed: Preterm labor symptoms and general obstetric precautions including but not limited to vaginal bleeding, contractions, leaking of fluid and fetal movement were reviewed in detail with the patient.  All questions were answered.  Follow-up: No follow-ups on file.  Future Appointments  Date Time Provider Department Center  09/15/2024 10:50 AM [facility] NURSE [facility]  09/18/2024  3:00 PM [facility] US  [facility] None  09/18/2024  3:50 PM Marilynn Nest, DO [facility]  09/19/2024 10:30 AM [facility] PAT 1 [facility] None  09/22/2024 10:50 AM [facility] NURSE [facility]  09/25/2024  3:10 PM [facility] NURSE [facility]  09/25/2024  3:30 PM Kizzie Suzen SAUNDERS, CNM [facility]  09/29/2024 11:10 AM [facility] NURSE [facility]  10/02/2024 10:45 AM [facility] US  [facility] None  10/02/2024 11:30 AM Kizzie Suzen SAUNDERS, CNM [facility]  10/06/2024 10:50 AM [facility] NURSE [facility]    Orders Placed This Encounter  Procedures   Strep Gp B NAA+Rflx   Vernell FORBES Ruddle, SNM 09/11/2024 3:29 PM  I personally saw and evaluated  the patient, performing the key elements of the service. I developed and verified the management plan that is described in the resident's/student's note, and I agree with the content with my edits above. VSS, HRR&R, Resp unlabored, Legs neg.  Sherrell Ely, CNM 09/11/2024 6:36 PM

## 2024-09-12 ENCOUNTER — Encounter (HOSPITAL_COMMUNITY): Payer: Self-pay

## 2024-09-12 ENCOUNTER — Other Ambulatory Visit: Payer: Self-pay | Admitting: Family Medicine

## 2024-09-12 DIAGNOSIS — O329XX0 Maternal care for malpresentation of fetus, unspecified, not applicable or unspecified: Secondary | ICD-10-CM

## 2024-09-12 DIAGNOSIS — Q513 Bicornate uterus: Secondary | ICD-10-CM

## 2024-09-15 ENCOUNTER — Other Ambulatory Visit

## 2024-09-15 LAB — CERVICOVAGINAL ANCILLARY ONLY
Chlamydia: NEGATIVE
Comment: NEGATIVE
Comment: NORMAL
Neisseria Gonorrhea: NEGATIVE

## 2024-09-18 ENCOUNTER — Other Ambulatory Visit

## 2024-09-18 ENCOUNTER — Ambulatory Visit: Admitting: Advanced Practice Midwife

## 2024-09-18 ENCOUNTER — Encounter: Payer: Self-pay | Admitting: Advanced Practice Midwife

## 2024-09-18 VITALS — BP 141/88 | HR 101 | Wt 236.0 lb

## 2024-09-18 DIAGNOSIS — Z331 Pregnant state, incidental: Secondary | ICD-10-CM

## 2024-09-18 DIAGNOSIS — Z1389 Encounter for screening for other disorder: Secondary | ICD-10-CM

## 2024-09-18 DIAGNOSIS — R030 Elevated blood-pressure reading, without diagnosis of hypertension: Secondary | ICD-10-CM

## 2024-09-18 DIAGNOSIS — O0993 Supervision of high risk pregnancy, unspecified, third trimester: Secondary | ICD-10-CM

## 2024-09-18 DIAGNOSIS — O0990 Supervision of high risk pregnancy, unspecified, unspecified trimester: Secondary | ICD-10-CM

## 2024-09-18 DIAGNOSIS — Z3A37 37 weeks gestation of pregnancy: Secondary | ICD-10-CM

## 2024-09-18 DIAGNOSIS — Q513 Bicornate uterus: Secondary | ICD-10-CM

## 2024-09-18 DIAGNOSIS — O99820 Streptococcus B carrier state complicating pregnancy: Secondary | ICD-10-CM | POA: Insufficient documentation

## 2024-09-18 LAB — POCT URINALYSIS DIPSTICK OB
Blood, UA: NEGATIVE
Glucose, UA: NEGATIVE
Ketones, UA: NEGATIVE
Nitrite, UA: NEGATIVE

## 2024-09-18 LAB — STREP GP B SUSCEPTIBILITY

## 2024-09-18 LAB — STREP GP B NAA+RFLX: Strep Gp B NAA+Rflx: POSITIVE — AB

## 2024-09-18 NOTE — Progress Notes (Signed)
 error

## 2024-09-18 NOTE — Patient Instructions (Signed)
 Hilliard Ahle, I greatly value your feedback.  If you receive a survey following your visit with us  today, we appreciate you taking the time to fill it out.  Thanks, Sherrell Ely, DNP, CNM  [facility] HAS MOVED!!! It is now [facility] at [facility] ([address] [place], [place] [address]) Entrance located off of [address] Free 24/7 valet parking   Go to [email or web address] to register for FREE online childbirth classes    Call the office [phone]) or go to [facility] if: You begin to have strong, frequent contractions Your water breaks.  Sometimes it is a big gush of fluid, sometimes it is just a trickle that keeps getting your panties wet or running down your legs You have vaginal bleeding.  It is normal to have a small amount of spotting if your cervix was checked.  You don't feel your baby moving like normal.  If you don't, get you something to eat and drink and lay down and focus on feeling your baby move.  You should feel at least 10 movements in 2 hours.  If you don't, you should call the office or go to [facility].   Home Blood Pressure Monitoring for Patients   Your provider has recommended that you check your blood pressure (BP) at least once a week at home. If you do not have a blood pressure cuff at home, one will be provided for you. Contact your provider if you have not received your monitor within 1 week.   Helpful Tips for Accurate Home Blood Pressure Checks  Don't smoke, exercise, or drink caffeine 30 minutes before checking your BP Use the restroom before checking your BP (a full bladder can raise your pressure) Relax in a comfortable upright chair Feet on the ground Left arm resting comfortably on a flat surface at the level of your heart Legs uncrossed Back supported Sit quietly and don't talk Place the cuff on your bare arm Adjust snuggly, so that only two fingertips can fit between your skin and the top of  the cuff Check 2 readings separated by at least one minute Keep a log of your BP readings For a visual, please reference this diagram: http://ccnc.care/bpdiagram  Provider Name: [facility]     Phone: [phone]  Zone 1: ALL CLEAR  Continue to monitor your symptoms:  BP reading is less than 140 (top number) or less than 90 (bottom number)  No right upper stomach pain No headaches or seeing spots No feeling nauseated or throwing up No swelling in face and hands  Zone 2: CAUTION Call your doctor's office for any of the following:  BP reading is greater than 140 (top number) or greater than 90 (bottom number)  Stomach pain under your ribs in the middle or right side Headaches or seeing spots Feeling nauseated or throwing up Swelling in face and hands  Zone 3: EMERGENCY  Seek immediate medical care if you have any of the following:  BP reading is greater than160 (top number) or greater than 110 (bottom number) Severe headaches not improving with Tylenol  Serious difficulty catching your breath Any worsening symptoms from Zone 2

## 2024-09-18 NOTE — Progress Notes (Cosign Needed Addendum)
 LOW-RISK PREGNANCY VISIT Patient name: Ellen Huffman MRN [id number]  Date of birth: Mar 10, 2001 Chief Complaint:   Routine Prenatal Visit  History of Present Illness:   Ellen Huffman is a 23 y.o. G65P0030 female at [redacted]w[redacted]d with an Estimated Date of Delivery: 10/06/24 being seen today for ongoing management of a low-risk pregnancy.  Today she reports no complaints. Contractions: Irritability. Movement: Present. Denies leaking of fluid. Review of Systems:   Pertinent items are noted in HPI Denies abnormal vaginal discharge w/ itching/odor/irritation, headaches, visual changes, shortness of breath, chest pain, abdominal pain, severe nausea/vomiting, or problems with urination or bowel movements unless otherwise stated above. Pertinent History Reviewed:  Reviewed past medical,surgical, social, obstetrical and family history.  Reviewed problem list, medications and allergies. Physical Assessment:     Vitals:   09/18/24 1502 09/18/24 1518  BP: 135/85 (!) 141/88  Pulse: (!) 108 (!) 101  Weight: 236 lb (107 kg)   Body mass index is 38.09 kg/m.        Physical Examination:   General appearance: Well appearing, and in no distress  Mental status: Alert, oriented to person, place, and time  Skin: Warm & dry  Cardiovascular: Normal heart rate noted  Respiratory: Normal respiratory effort, no distress  Abdomen: Soft, gravid, nontender  Pelvic: Cervical exam performed Dilation: 1 Effacement (%): 20    Extremities: Edema: None  Chaperone: Sherrell Ely, CNM   Fetal Status: Fetal Heart Rate (bpm): 135 Fundal Height: 37 cm Movement: Present     Results for orders placed or performed in visit on 09/18/24 (from the past 24 hours)  POC Urinalysis Dipstick OB   Collection Time: 09/18/24  3:24 PM  Result Value Ref Range   Color, UA     Clarity, UA     Glucose, UA Negative Negative   Bilirubin, UA     Ketones, UA negative    Spec Grav, UA     Blood, UA negative    pH, UA      POC,PROTEIN,UA Trace Negative, Trace, Small (1+), Moderate (2+), Large (3+), 4+   Urobilinogen, UA     Nitrite, UA negative    Leukocytes, UA Small (1+) (A) Negative   Appearance     Odor      Assessment & Plan:   Pregnancy: G4P0030 at [redacted]w[redacted]d 1. Supervision of high risk pregnancy, antepartum (Primary) - Feeling regular fetal movement, no concerns today - Discussed breastfeeding questions  2. [redacted] weeks gestation of pregnancy - Continue weekly visits  3. Bicornuate uterus - Breech presentation, primary c-section scheduled 10/02/24  4. GBS (group B Streptococcus carrier), +RV culture, currently pregnant - Penicillin allergy, sensitivities not resulted, Rutherford Rover, RN to call [facility] to follow up on results  7. Elevated blood pressure reading without diagnosis of hypertension - BP 135/85 initially, repeated while in office and was 141/88 - Denies s/s of pre-e - UA shows trace protein only - Continue checking BP at home and notify office if >140/90    Meds: No orders of the defined types were placed in this encounter.  Labs/procedures today: none  Plan:  Continue routine obstetrical care weekly Next visit: in-person    Reviewed: Term labor symptoms and general obstetric precautions including but not limited to vaginal bleeding, contractions, leaking of fluid and fetal movement were reviewed in detail with the patient.  All questions were answered. Has home bp cuff.   Follow-up: No follow-ups on file.  Future Appointments  Date Time Provider Department Center  09/25/2024  3:30 PM Kizzie Suzen SAUNDERS, CNM [facility]  09/30/2024  9:00 AM [facility] PAT 1 [facility] None  10/02/2024  3:50 PM Kizzie Suzen SAUNDERS, CNM [facility]   Orders Placed This Encounter  Procedures   POC Urinalysis Dipstick OB   Cathlean Ely, [facility] 09/18/2024 4:15 PM

## 2024-09-19 ENCOUNTER — Encounter (HOSPITAL_COMMUNITY)
Admission: RE | Admit: 2024-09-19 | Discharge: 2024-09-19 | Disposition: A | Discharge status: Home / Self Care | Source: Ambulatory Visit | Attending: Family Medicine | Admitting: Family Medicine

## 2024-09-22 ENCOUNTER — Other Ambulatory Visit

## 2024-09-24 ENCOUNTER — Ambulatory Visit (INDEPENDENT_AMBULATORY_CARE_PROVIDER_SITE_OTHER): Admitting: *Deleted

## 2024-09-24 VITALS — BP 135/89 | HR 79

## 2024-09-24 DIAGNOSIS — Z3A38 38 weeks gestation of pregnancy: Secondary | ICD-10-CM

## 2024-09-24 DIAGNOSIS — O368130 Decreased fetal movements, third trimester, not applicable or unspecified: Secondary | ICD-10-CM

## 2024-09-24 DIAGNOSIS — O0990 Supervision of high risk pregnancy, unspecified, unspecified trimester: Secondary | ICD-10-CM

## 2024-09-24 NOTE — Progress Notes (Signed)
   NURSE VISIT- NST  SUBJECTIVE:  Ellen Huffman is a 23 y.o. G72P0030 female at [redacted]w[redacted]d, here for a NST for pregnancy complicated by decreased fetal movement.  She reports decreased  fetal movement, contractions: none, vaginal bleeding: none, membranes: intact.   OBJECTIVE:  BP 135/89   Pulse 79   LMP 12/15/2023   Appears well, no apparent distress  No results found for this or any previous visit (from the past 24 hours).  NST: FHR baseline 130 bpm, Variability: moderate, Accelerations:present, Decelerations:  Absent= Cat 1/reactive Toco: none   ASSESSMENT: G4P0030 at [redacted]w[redacted]d with FGR now resolved NST reactive  PLAN: EFM strip reviewed by Luke Fetters, CNM, [facility]   Recommendations: keep next appointment as scheduled    Rutherford Rover  09/24/2024 3:20 PM

## 2024-09-25 ENCOUNTER — Other Ambulatory Visit

## 2024-09-25 ENCOUNTER — Encounter: Payer: Self-pay | Admitting: Women's Health

## 2024-09-25 ENCOUNTER — Ambulatory Visit (INDEPENDENT_AMBULATORY_CARE_PROVIDER_SITE_OTHER): Admitting: Women's Health

## 2024-09-25 VITALS — BP 129/88 | HR 87 | Wt 236.0 lb

## 2024-09-25 DIAGNOSIS — Z3A38 38 weeks gestation of pregnancy: Secondary | ICD-10-CM | POA: Diagnosis not present

## 2024-09-25 DIAGNOSIS — Z3483 Encounter for supervision of other normal pregnancy, third trimester: Secondary | ICD-10-CM | POA: Diagnosis not present

## 2024-09-25 NOTE — Patient Instructions (Signed)
 Ellen Huffman, thank you for choosing our office today! We appreciate the opportunity to meet your healthcare needs. You may receive a short survey by mail, e-mail, or through [employer]. If you are happy with your care we would appreciate if you could take just a few minutes to complete the survey questions. We read all of your comments and take your feedback very seriously. Thank you again for choosing our office.  Center for [employer] Team at [facility]  [facility] at [facility] ([address] [place], [place] [address]) Entrance C, located off of [address] Free 24/7 valet parking   CLASSES: Go to [email or web address] to register for classes (childbirth, breastfeeding, waterbirth, infant CPR, daddy bootcamp, etc.)  Call the office [phone]) or go to [facility] if: You begin to have strong, frequent contractions Your water breaks.  Sometimes it is a big gush of fluid, sometimes it is just a trickle that keeps getting your panties wet or running down your legs You have vaginal bleeding.  It is normal to have a small amount of spotting if your cervix was checked.  You don't feel your baby moving like normal.  If you don't, get you something to eat and drink and lay down and focus on feeling your baby move.   If your baby is still not moving like normal, you should call the office or go to [facility].  Call the office [phone]) or go to [facility] hospital for these signs of pre-eclampsia: Severe headache that does not go away with Tylenol  Visual changes- seeing spots, double, blurred vision Pain under your right breast or upper abdomen that does not go away with Tums or heartburn medicine Nausea and/or vomiting Severe swelling in your hands, feet, and face   [facility]/Family Doctors [facility]): [address] Luba BROCKS, [phone]           [facility]: [address], [phone]                 [facility]): [address], [phone] (call to ask if accepting patients) [facility] Department: [address], [place], [phone]    [facility] Pediatricians/Family Doctors [facility]): [address], [phone] [facility]: [address] [place], [phone] [facility]: [address], [phone]  [facility] Doctors  [facility]): [phone] [facility]: [address], [phone]   [facility] Doctors [facility]: [address] [phone]  [facility] Doctors  [facility]: [phone], [phone]  Home Blood Pressure Monitoring for Patients   Your provider has recommended that you check your blood pressure (BP) at least once a week at home. If you do not have a blood pressure cuff at home, one will be provided for you. Contact your provider if you have not received your monitor within 1 week.   Helpful Tips for Accurate Home Blood Pressure Checks  Don't smoke, exercise, or drink caffeine 30 minutes before checking your BP Use the restroom before checking your BP (a full bladder can raise your pressure) Relax in a comfortable upright chair Feet on the ground Left arm resting comfortably on a flat surface at the level of your heart Legs uncrossed Back supported Sit quietly and don't talk Place the cuff on your bare arm Adjust snuggly, so that only two fingertips  can fit between your skin and the top of the cuff Check 2 readings separated by at least one minute Keep a log of your BP readings For a visual, please reference this diagram: http://ccnc.care/bpdiagram  Provider Name: [facility]     Phone: [phone]  Zone 1: ALL CLEAR  Continue to monitor your symptoms:  BP reading is less than 140 (top number) or less than 90 (bottom number)  No right  upper stomach pain No headaches or seeing spots No feeling nauseated or throwing up No swelling in face and hands  Zone 2: CAUTION Call your doctor's office for any of the following:  BP reading is greater than 140 (top number) or greater than 90 (bottom number)  Stomach pain under your ribs in the middle or right side Headaches or seeing spots Feeling nauseated or throwing up Swelling in face and hands  Zone 3: EMERGENCY  Seek immediate medical care if you have any of the following:  BP reading is greater than160 (top number) or greater than 110 (bottom number) Severe headaches not improving with Tylenol  Serious difficulty catching your breath Any worsening symptoms from Zone 2   Braxton Hicks Contractions Contractions of the uterus can occur throughout pregnancy, but they are not always a sign that you are in labor. You may have practice contractions called Braxton Hicks contractions. These false labor contractions are sometimes confused with true labor. What are Darol Irving contractions? Braxton Hicks contractions are tightening movements that occur in the muscles of the uterus before labor. Unlike true labor contractions, these contractions do not result in opening (dilation) and thinning of the cervix. Toward the end of pregnancy (32-34 weeks), Braxton Hicks contractions can happen more often and may become stronger. These contractions are sometimes difficult to tell apart from true labor because they can be very uncomfortable. You should not feel embarrassed if you go to the hospital with false labor. Sometimes, the only way to tell if you are in true labor is for your health care provider to look for changes in the cervix. The health care provider will do a physical exam and may monitor your contractions. If you are not in true labor, the exam should show that your cervix is not dilating and your water has not broken. If there are no other health problems associated with your  pregnancy, it is completely safe for you to be sent home with false labor. You may continue to have Braxton Hicks contractions until you go into true labor. How to tell the difference between true labor and false labor True labor Contractions last 30-70 seconds. Contractions become very regular. Discomfort is usually felt in the top of the uterus, and it spreads to the lower abdomen and low back. Contractions do not go away with walking. Contractions usually become more intense and increase in frequency. The cervix dilates and gets thinner. False labor Contractions are usually shorter and not as strong as true labor contractions. Contractions are usually irregular. Contractions are often felt in the front of the lower abdomen and in the groin. Contractions may go away when you walk around or change positions while lying down. Contractions get weaker and are shorter-lasting as time goes on. The cervix usually does not dilate or become thin. Follow these instructions at home:  Take over-the-counter and prescription medicines only as told by your health care provider. Keep up with your usual exercises and follow other instructions from your health care provider. Eat and drink lightly if you think  you are going into labor. If Braxton Hicks contractions are making you uncomfortable: Change your position from lying down or resting to walking, or change from walking to resting. Sit and rest in a tub of warm water. Drink enough fluid to keep your urine pale yellow. Dehydration may cause these contractions. Do slow and deep breathing several times an hour. Keep all follow-up prenatal visits as told by your health care provider. This is important. Contact a health care provider if: You have a fever. You have continuous pain in your abdomen. Get help right away if: Your contractions become stronger, more regular, and closer together. You have fluid leaking or gushing from your vagina. You pass  blood-tinged mucus (bloody show). You have bleeding from your vagina. You have low back pain that you never had before. You feel your baby's head pushing down and causing pelvic pressure. Your baby is not moving inside you as much as it used to. Summary Contractions that occur before labor are called Braxton Hicks contractions, false labor, or practice contractions. Braxton Hicks contractions are usually shorter, weaker, farther apart, and less regular than true labor contractions. True labor contractions usually become progressively stronger and regular, and they become more frequent. Manage discomfort from [facility] contractions by changing position, resting in a warm bath, drinking plenty of water, or practicing deep breathing. This information is not intended to replace advice given to you by your health care provider. Make sure you discuss any questions you have with your health care provider. Document Revised: 11/23/2017 Document Reviewed: 04/26/2017 Elsevier Patient Education  2020 [employer].

## 2024-09-25 NOTE — Progress Notes (Signed)
 LOW-RISK PREGNANCY VISIT Patient name: Ellen Huffman MRN [id number]  Date of birth: Jun 27, 2001 Chief Complaint:   Routine Prenatal Visit  History of Present Illness:   Ellen Huffman is a 23 y.o. G51P0030 female at [redacted]w[redacted]d with an Estimated Date of Delivery: 10/06/24 being seen today for ongoing management of a low-risk pregnancy.   Today she reports no complaints. Contractions: Not present. Vag. Bleeding: None.  Movement: Present. denies leaking of fluid.     09/11/2024    3:13 PM 03/24/2024    1:35 PM 01/18/2023   10:44 AM 12/08/2022    9:27 AM 11/14/2021    1:50 PM  Depression screen PHQ 2/9  Decreased Interest 0 1 1 0 1  Down, Depressed, Hopeless 0 0 1 0 1  PHQ - 2 Score 0 1 2 0 2  Altered sleeping 0 0 1 0 1  Tired, decreased energy 0 3 1 0 2  Change in appetite 0 0 1 0 2  Feeling bad or failure about yourself  0 0 1 0 0  Trouble concentrating 0 0 0 0 0  Moving slowly or fidgety/restless 0 0 0 0 0  Suicidal thoughts 0 0 0 0 0  PHQ-9 Score 0 4 6 0 7  Difficult doing work/chores    Not difficult at all Not difficult at all        09/11/2024    3:13 PM 03/24/2024    1:35 PM 01/18/2023   10:44 AM 12/08/2022    9:28 AM  GAD 7 : Generalized Anxiety Score  Nervous, Anxious, on Edge 0 2 3 0  Control/stop worrying 0 3 3 0  Worry too much - different things 0 3 3 0  Trouble relaxing 0 1 2 0  Restless 0 0 2 0  Easily annoyed or irritable 0 2 3 2   Afraid - awful might happen 0 2 1 2   Total GAD 7 Score 0 13 17 4   Anxiety Difficulty    Not difficult at all      Review of Systems:   Pertinent items are noted in HPI Denies abnormal vaginal discharge w/ itching/odor/irritation, headaches, visual changes, shortness of breath, chest pain, abdominal pain, severe nausea/vomiting, or problems with urination or bowel movements unless otherwise stated above. Pertinent History Reviewed:  Reviewed past medical,surgical, social, obstetrical and family history.  Reviewed problem list,  medications and allergies. Physical Assessment:   Vitals:   09/25/24 1525  BP: 129/88  Pulse: 87  Weight: 236 lb (107 kg)  Body mass index is 38.09 kg/m.        Physical Examination:   General appearance: Well appearing, and in no distress  Mental status: Alert, oriented to person, place, and time  Skin: Warm & dry  Cardiovascular: Normal heart rate noted  Respiratory: Normal respiratory effort, no distress  Abdomen: Soft, gravid, nontender  Pelvic: Cervical exam deferred         Extremities:    Fetal Status: Fetal Heart Rate (bpm): 143 Fundal Height: 38 cm Movement: Present    Chaperone: N/A No results found for this or any previous visit (from the past 24 hours).  Assessment & Plan:  1) Low-risk pregnancy G4P0030 at [redacted]w[redacted]d with an Estimated Date of Delivery: 10/06/24   2) Resolved FGR, EFW 16% (AC 40%) at 36.3  3) Breech presentation> scheduled for PCS 10/9  4) Bicornuate uterus   Meds: No orders of the defined types were placed in this encounter.  Labs/procedures today: none  Plan:  Continue routine obstetrical care   Reviewed: Term labor symptoms and general obstetric precautions including but not limited to vaginal bleeding, contractions, leaking of fluid and fetal movement were reviewed in detail with the patient.  All questions were answered. Does have home bp cuff. Office bp cuff given: not applicable. Check bp daily, let us  know if consistently >140 and/or >90 or pre-e sx.  Follow-up: Return for cancel 10/9 appt, schedule incision check 10/16.  Future Appointments  Date Time Provider Department Center  09/30/2024  9:00 AM [facility]-LD PAT 1 [facility] None    No orders of the defined types were placed in this encounter.  Suzen JONELLE Fetters CNM, [facility] 09/25/2024 3:47 PM

## 2024-09-29 ENCOUNTER — Other Ambulatory Visit

## 2024-09-29 NOTE — Patient Instructions (Signed)
 Ellen Huffman  09/29/2024   Your procedure is scheduled on:  10/02/2024  Arrive at 1015 at Entrance C on [employer] at [facility]  and [employer]. You are invited to use the FREE valet parking or use the Visitor's parking deck.  Pick up the phone at the desk and dial  [phone].  Call this number if you have problems the morning of surgery: [phone]  Remember:   Do not eat food:(After Midnight) Desps de medianoche.  You may drink clear liquids until  __0815___.  Clear liquids means a liquid you can see thru.  It can have color such as Cola or Kool aid.  Tea is OK and coffee as long as no milk or creamer of any kind.  Take these medicines the morning of surgery with A SIP OF WATER:  none   Do not wear jewelry, make-up or nail polish.  Do not wear lotions, powders, or perfumes. Do not wear deodorant.  Do not shave 48 hours prior to surgery.  Do not bring valuables to the hospital.  [facility] is not   responsible for any belongings or valuables brought to the hospital.  Contacts, dentures or bridgework may not be worn into surgery.  Leave suitcase in the car. After surgery it may be brought to your room.  For patients admitted to the hospital, checkout time is 11:00 AM the day of              discharge.      Please read over the following fact sheets that you were given:     Preparing for Surgery

## 2024-09-30 ENCOUNTER — Encounter (HOSPITAL_COMMUNITY)
Admission: RE | Admit: 2024-09-30 | Discharge: 2024-09-30 | Disposition: A | Discharge status: Home / Self Care | Source: Ambulatory Visit | Attending: Family Medicine | Admitting: Family Medicine

## 2024-09-30 ENCOUNTER — Inpatient Hospital Stay (HOSPITAL_COMMUNITY): Admission: RE | Admit: 2024-09-30 | Source: Home / Self Care | Admitting: Obstetrics and Gynecology

## 2024-09-30 ENCOUNTER — Other Ambulatory Visit: Payer: Self-pay

## 2024-09-30 ENCOUNTER — Encounter (HOSPITAL_COMMUNITY): Payer: Self-pay | Admitting: Obstetrics & Gynecology

## 2024-09-30 ENCOUNTER — Inpatient Hospital Stay (HOSPITAL_COMMUNITY)
Admission: AD | Admit: 2024-09-30 | Discharge: 2024-10-03 | DRG: 788 | Disposition: A | Discharge status: Home / Self Care | Attending: Obstetrics and Gynecology | Admitting: Obstetrics and Gynecology

## 2024-09-30 ENCOUNTER — Encounter (HOSPITAL_COMMUNITY): Admission: AD | Disposition: A | Payer: Self-pay | Source: Home / Self Care | Attending: Obstetrics and Gynecology

## 2024-09-30 DIAGNOSIS — Z87891 Personal history of nicotine dependence: Secondary | ICD-10-CM | POA: Diagnosis not present

## 2024-09-30 DIAGNOSIS — O321XX0 Maternal care for breech presentation, not applicable or unspecified: Secondary | ICD-10-CM | POA: Diagnosis present

## 2024-09-30 DIAGNOSIS — Z01812 Encounter for preprocedural laboratory examination: Secondary | ICD-10-CM | POA: Insufficient documentation

## 2024-09-30 DIAGNOSIS — F1210 Cannabis abuse, uncomplicated: Secondary | ICD-10-CM

## 2024-09-30 DIAGNOSIS — Z3A39 39 weeks gestation of pregnancy: Secondary | ICD-10-CM | POA: Diagnosis not present

## 2024-09-30 DIAGNOSIS — R030 Elevated blood-pressure reading, without diagnosis of hypertension: Secondary | ICD-10-CM | POA: Diagnosis present

## 2024-09-30 DIAGNOSIS — F3110 Bipolar disorder, current episode manic without psychotic features, unspecified: Secondary | ICD-10-CM

## 2024-09-30 DIAGNOSIS — O1414 Severe pre-eclampsia complicating childbirth: Principal | ICD-10-CM | POA: Diagnosis present

## 2024-09-30 DIAGNOSIS — G909 Disorder of the autonomic nervous system, unspecified: Secondary | ICD-10-CM

## 2024-09-30 DIAGNOSIS — F1114 Opioid abuse with opioid-induced mood disorder: Secondary | ICD-10-CM

## 2024-09-30 DIAGNOSIS — O0990 Supervision of high risk pregnancy, unspecified, unspecified trimester: Secondary | ICD-10-CM

## 2024-09-30 DIAGNOSIS — O99820 Streptococcus B carrier state complicating pregnancy: Secondary | ICD-10-CM

## 2024-09-30 DIAGNOSIS — O329XX0 Maternal care for malpresentation of fetus, unspecified, not applicable or unspecified: Secondary | ICD-10-CM | POA: Insufficient documentation

## 2024-09-30 DIAGNOSIS — Q513 Bicornate uterus: Secondary | ICD-10-CM

## 2024-09-30 DIAGNOSIS — F900 Attention-deficit hyperactivity disorder, predominantly inattentive type: Secondary | ICD-10-CM

## 2024-09-30 DIAGNOSIS — R002 Palpitations: Secondary | ICD-10-CM

## 2024-09-30 DIAGNOSIS — F332 Major depressive disorder, recurrent severe without psychotic features: Secondary | ICD-10-CM

## 2024-09-30 DIAGNOSIS — O1493 Unspecified pre-eclampsia, third trimester: Principal | ICD-10-CM

## 2024-09-30 DIAGNOSIS — Z7982 Long term (current) use of aspirin: Secondary | ICD-10-CM | POA: Diagnosis not present

## 2024-09-30 DIAGNOSIS — O365930 Maternal care for other known or suspected poor fetal growth, third trimester, not applicable or unspecified: Secondary | ICD-10-CM

## 2024-09-30 DIAGNOSIS — F411 Generalized anxiety disorder: Secondary | ICD-10-CM

## 2024-09-30 DIAGNOSIS — Z7289 Other problems related to lifestyle: Secondary | ICD-10-CM

## 2024-09-30 DIAGNOSIS — T1491XA Suicide attempt, initial encounter: Secondary | ICD-10-CM

## 2024-09-30 DIAGNOSIS — O1413 Severe pre-eclampsia, third trimester: Secondary | ICD-10-CM | POA: Diagnosis present

## 2024-09-30 LAB — URINALYSIS, ROUTINE W REFLEX MICROSCOPIC
Bilirubin Urine: NEGATIVE
Glucose, UA: NEGATIVE mg/dL
Hgb urine dipstick: NEGATIVE
Ketones, ur: NEGATIVE mg/dL
Leukocytes,Ua: NEGATIVE
Nitrite: NEGATIVE
Protein, ur: NEGATIVE mg/dL
Specific Gravity, Urine: 1.008 (ref 1.005–1.030)
pH: 7 (ref 5.0–8.0)

## 2024-09-30 LAB — COMPREHENSIVE METABOLIC PANEL WITH GFR
ALT: 20 U/L (ref 0–44)
AST: 19 U/L (ref 15–41)
Albumin: 3 g/dL — ABNORMAL LOW (ref 3.5–5.0)
Alkaline Phosphatase: 84 U/L (ref 38–126)
Anion gap: 11 (ref 5–15)
BUN: 5 mg/dL — ABNORMAL LOW (ref 6–20)
CO2: 21 mmol/L — ABNORMAL LOW (ref 22–32)
Calcium: 9.3 mg/dL (ref 8.9–10.3)
Chloride: 107 mmol/L (ref 98–111)
Creatinine, Ser: 0.51 mg/dL (ref 0.44–1.00)
GFR, Estimated: >60 mL/min (ref >60)
Glucose, Bld: 80 mg/dL (ref 70–99)
Potassium: 3.5 mmol/L (ref 3.5–5.1)
Sodium: 139 mmol/L (ref 135–145)
Total Bilirubin: 0.5 mg/dL (ref 0.0–1.2)
Total Protein: 7 g/dL (ref 6.5–8.1)

## 2024-09-30 LAB — CBC
HCT: 39.4 % (ref 36.0–46.0)
HCT: 38.9 % (ref 36.0–46.0)
Hemoglobin: 13.5 g/dL (ref 12.0–15.0)
Hemoglobin: 13.3 g/dL (ref 12.0–15.0)
MCH: 31.5 pg (ref 26.0–34.0)
MCH: 31.4 pg (ref 26.0–34.0)
MCHC: 34.3 g/dL (ref 30.0–36.0)
MCHC: 34.2 g/dL (ref 30.0–36.0)
MCV: 92.1 fL (ref 80.0–100.0)
MCV: 92 fL (ref 80.0–100.0)
Platelets: 233 K/uL (ref 150–400)
Platelets: 238 K/uL (ref 150–400)
RBC: 4.28 MIL/uL (ref 3.87–5.11)
RBC: 4.23 MIL/uL (ref 3.87–5.11)
RDW: 13.1 % (ref 11.5–15.5)
RDW: 13.2 % (ref 11.5–15.5)
WBC: 9.5 K/uL (ref 4.0–10.5)
WBC: 10.2 K/uL (ref 4.0–10.5)
nRBC: 0 % (ref 0.0–0.2)
nRBC: 0 % (ref 0.0–0.2)

## 2024-09-30 LAB — TYPE AND SCREEN
ABO/RH(D): A POS
Antibody Screen: NEGATIVE

## 2024-09-30 LAB — PROTEIN / CREATININE RATIO, URINE
Creatinine, Urine: 47 mg/dL
Total Protein, Urine: <6 mg/dL

## 2024-09-30 LAB — RAPID HIV SCREEN (HIV 1/2 AB+AG)
HIV 1/2 Antibodies: NONREACTIVE
HIV-1 P24 Antigen - HIV24: NONREACTIVE

## 2024-09-30 LAB — RPR: RPR Ser Ql: NONREACTIVE

## 2024-09-30 SURGERY — Surgical Case
Anesthesia: Spinal

## 2024-09-30 MED ORDER — NIFEDIPINE 10 MG PO CAPS: 20.0000 mg | ORAL_CAPSULE | ORAL | Status: DC | PRN

## 2024-09-30 MED ORDER — SENNOSIDES-DOCUSATE SODIUM 8.6-50 MG PO TABS
2.0000 | ORAL_TABLET | Freq: Every day | ORAL | Status: DC
  Administered 2024-10-01 – 2024-10-03 (×3): 2 via ORAL
  Filled 2024-09-30 (×3): qty 2

## 2024-09-30 MED ORDER — OXYTOCIN-SODIUM CHLORIDE 30-0.9 UT/500ML-% IV SOLN
INTRAVENOUS | Status: DC | PRN
  Administered 2024-09-30: 300 mL via INTRAVENOUS

## 2024-09-30 MED ORDER — DIPHENHYDRAMINE HCL 25 MG PO CAPS: 25.0000 mg | ORAL_CAPSULE | Freq: Four times a day (QID) | ORAL | Status: DC | PRN

## 2024-09-30 MED ORDER — PRENATAL MULTIVITAMIN CH
1.0000 | ORAL_TABLET | Freq: Every day | ORAL | Status: DC
  Administered 2024-10-01 – 2024-10-03 (×3): 1 via ORAL
  Filled 2024-09-30 (×3): qty 1

## 2024-09-30 MED ORDER — ACETAMINOPHEN 10 MG/ML IV SOLN
INTRAVENOUS | Status: AC
  Filled 2024-09-30: qty 100

## 2024-09-30 MED ORDER — HYDROMORPHONE HCL 1 MG/ML IJ SOLN: 0.2500 mg | INTRAMUSCULAR | Status: DC | PRN

## 2024-09-30 MED ORDER — PHENYLEPHRINE HCL-NACL 20-0.9 MG/250ML-% IV SOLN
INTRAVENOUS | Status: AC
  Filled 2024-09-30: qty 250

## 2024-09-30 MED ORDER — MEPERIDINE HCL 25 MG/ML IJ SOLN: 6.2500 mg | INTRAMUSCULAR | Status: DC | PRN

## 2024-09-30 MED ORDER — NIFEDIPINE 10 MG PO CAPS
10.0000 mg | ORAL_CAPSULE | ORAL | Status: DC | PRN
  Administered 2024-09-30: 10 mg via ORAL
  Filled 2024-09-30: qty 1

## 2024-09-30 MED ORDER — NALOXONE HCL 0.4 MG/ML IJ SOLN: 0.4000 mg | INTRAMUSCULAR | Status: DC | PRN

## 2024-09-30 MED ORDER — ONDANSETRON HCL 4 MG/2ML IJ SOLN
INTRAMUSCULAR | Status: AC
  Filled 2024-09-30: qty 2

## 2024-09-30 MED ORDER — STERILE WATER FOR IRRIGATION IR SOLN
Status: DC | PRN
  Administered 2024-09-30: 1000 mL

## 2024-09-30 MED ORDER — AMISULPRIDE (ANTIEMETIC) 5 MG/2ML IV SOLN: 10.0000 mg | Freq: Once | INTRAVENOUS | Status: DC | PRN

## 2024-09-30 MED ORDER — MAGNESIUM SULFATE BOLUS VIA INFUSION
4.0000 g | Freq: Once | INTRAVENOUS | Status: AC
  Administered 2024-09-30: 4 g via INTRAVENOUS
  Filled 2024-09-30: qty 1000

## 2024-09-30 MED ORDER — FENTANYL CITRATE (PF) 100 MCG/2ML IJ SOLN
INTRAMUSCULAR | Status: DC | PRN
  Administered 2024-09-30: 15 ug via INTRATHECAL

## 2024-09-30 MED ORDER — LACTATED RINGERS IV SOLN: INTRAVENOUS | Status: DC | PRN

## 2024-09-30 MED ORDER — DIBUCAINE (PERIANAL) 1 % EX OINT: 1.0000 | TOPICAL_OINTMENT | CUTANEOUS | Status: DC | PRN

## 2024-09-30 MED ORDER — BUPIVACAINE IN DEXTROSE 0.75-8.25 % IT SOLN
INTRATHECAL | Status: DC | PRN
  Administered 2024-09-30: 1.6 mL via INTRATHECAL

## 2024-09-30 MED ORDER — ONDANSETRON HCL 4 MG/2ML IJ SOLN
INTRAMUSCULAR | Status: DC | PRN
  Administered 2024-09-30: 4 mg via INTRAVENOUS

## 2024-09-30 MED ORDER — ENOXAPARIN SODIUM 40 MG/0.4ML IJ SOSY
40.0000 mg | PREFILLED_SYRINGE | INTRAMUSCULAR | Status: DC
  Administered 2024-10-01 – 2024-10-03 (×3): 40 mg via SUBCUTANEOUS
  Filled 2024-09-30 (×3): qty 0.4

## 2024-09-30 MED ORDER — IBUPROFEN 600 MG PO TABS
600.0000 mg | ORAL_TABLET | Freq: Four times a day (QID) | ORAL | Status: DC
  Administered 2024-10-01 – 2024-10-03 (×11): 600 mg via ORAL
  Filled 2024-09-30 (×11): qty 1

## 2024-09-30 MED ORDER — OXYTOCIN-SODIUM CHLORIDE 30-0.9 UT/500ML-% IV SOLN
INTRAVENOUS | Status: AC
  Filled 2024-09-30: qty 500

## 2024-09-30 MED ORDER — CEFAZOLIN SODIUM-DEXTROSE 2-4 GM/100ML-% IV SOLN
2.0000 g | INTRAVENOUS | Status: AC
  Administered 2024-09-30: 2 g via INTRAVENOUS
  Filled 2024-09-30: qty 100

## 2024-09-30 MED ORDER — MENTHOL 3 MG MT LOZG: 1.0000 | LOZENGE | OROMUCOSAL | Status: DC | PRN

## 2024-09-30 MED ORDER — COCONUT OIL OIL: 1.0000 | TOPICAL_OIL | Status: DC | PRN

## 2024-09-30 MED ORDER — SODIUM CHLORIDE 0.9% FLUSH: 3.0000 mL | INTRAVENOUS | Status: DC | PRN

## 2024-09-30 MED ORDER — LABETALOL HCL 5 MG/ML IV SOLN: 40.0000 mg | INTRAVENOUS | Status: DC | PRN

## 2024-09-30 MED ORDER — MAGNESIUM SULFATE 40 GM/1000ML IV SOLN
2.0000 g/h | INTRAVENOUS | Status: AC
  Administered 2024-10-01: 2 g/h via INTRAVENOUS
  Filled 2024-09-30 (×2): qty 1000

## 2024-09-30 MED ORDER — MORPHINE SULFATE (PF) 0.5 MG/ML IJ SOLN
INTRAMUSCULAR | Status: DC | PRN
  Administered 2024-09-30: 150 ug via INTRATHECAL

## 2024-09-30 MED ORDER — DIPHENHYDRAMINE HCL 25 MG PO CAPS: 25.0000 mg | ORAL_CAPSULE | ORAL | Status: DC | PRN

## 2024-09-30 MED ORDER — OXYCODONE HCL 5 MG/5ML PO SOLN: 5.0000 mg | Freq: Once | ORAL | Status: DC | PRN

## 2024-09-30 MED ORDER — OXYCODONE-ACETAMINOPHEN 5-325 MG PO TABS
1.0000 | ORAL_TABLET | ORAL | Status: DC | PRN
  Administered 2024-10-01: 1 via ORAL
  Administered 2024-10-02 (×2): 2 via ORAL
  Administered 2024-10-02: 1 via ORAL
  Filled 2024-09-30: qty 1
  Filled 2024-09-30 (×2): qty 2
  Filled 2024-09-30: qty 1

## 2024-09-30 MED ORDER — KETOROLAC TROMETHAMINE 30 MG/ML IJ SOLN: 30.0000 mg | Freq: Once | INTRAMUSCULAR | Status: DC | PRN

## 2024-09-30 MED ORDER — SIMETHICONE 80 MG PO CHEW: 80.0000 mg | CHEWABLE_TABLET | ORAL | Status: DC | PRN

## 2024-09-30 MED ORDER — OXYTOCIN-SODIUM CHLORIDE 30-0.9 UT/500ML-% IV SOLN: 2.5000 [IU]/h | INTRAVENOUS | Status: AC

## 2024-09-30 MED ORDER — FENTANYL CITRATE (PF) 100 MCG/2ML IJ SOLN
INTRAMUSCULAR | Status: AC
  Filled 2024-09-30: qty 2

## 2024-09-30 MED ORDER — DEXAMETHASONE SODIUM PHOSPHATE 10 MG/ML IJ SOLN
INTRAMUSCULAR | Status: AC
  Filled 2024-09-30: qty 1

## 2024-09-30 MED ORDER — NALOXONE HCL 4 MG/10ML IJ SOLN: 1.0000 ug/kg/h | INTRAVENOUS | Status: DC | PRN

## 2024-09-30 MED ORDER — PHENYLEPHRINE HCL-NACL 20-0.9 MG/250ML-% IV SOLN
INTRAVENOUS | Status: DC | PRN
  Administered 2024-09-30: 60 ug/min via INTRAVENOUS

## 2024-09-30 MED ORDER — SODIUM CHLORIDE 0.9 % IR SOLN
Status: DC | PRN
  Administered 2024-09-30: 1

## 2024-09-30 MED ORDER — ACETAMINOPHEN 10 MG/ML IV SOLN
INTRAVENOUS | Status: DC | PRN
  Administered 2024-09-30: 1000 mg via INTRAVENOUS

## 2024-09-30 MED ORDER — DIPHENHYDRAMINE HCL 50 MG/ML IJ SOLN
12.5000 mg | INTRAMUSCULAR | Status: DC | PRN
Start: 2024-09-30 — End: 2024-10-03

## 2024-09-30 MED ORDER — DIPHENHYDRAMINE HCL 50 MG/ML IJ SOLN: 12.5000 mg | INTRAMUSCULAR | Status: DC | PRN

## 2024-09-30 MED ORDER — SODIUM CHLORIDE 0.9 % IV SOLN: 12.5000 mg | INTRAVENOUS | Status: DC | PRN

## 2024-09-30 MED ORDER — WITCH HAZEL-GLYCERIN EX PADS: 1.0000 | MEDICATED_PAD | CUTANEOUS | Status: DC | PRN

## 2024-09-30 MED ORDER — SOD CITRATE-CITRIC ACID 500-334 MG/5ML PO SOLN
30.0000 mL | ORAL | Status: AC
  Administered 2024-09-30: 30 mL via ORAL
  Filled 2024-09-30: qty 30

## 2024-09-30 MED ORDER — OXYCODONE HCL 5 MG PO TABS: 5.0000 mg | ORAL_TABLET | Freq: Once | ORAL | Status: DC | PRN

## 2024-09-30 MED ORDER — DEXAMETHASONE SODIUM PHOSPHATE 10 MG/ML IJ SOLN
INTRAMUSCULAR | Status: DC | PRN
  Administered 2024-09-30 (×2): 5 mg via INTRAVENOUS

## 2024-09-30 MED ORDER — MORPHINE SULFATE (PF) 0.5 MG/ML IJ SOLN
INTRAMUSCULAR | Status: AC
  Filled 2024-09-30: qty 10

## 2024-09-30 MED ORDER — SIMETHICONE 80 MG PO CHEW
80.0000 mg | CHEWABLE_TABLET | Freq: Three times a day (TID) | ORAL | Status: DC
  Administered 2024-10-01 – 2024-10-03 (×8): 80 mg via ORAL
  Filled 2024-09-30 (×8): qty 1

## 2024-09-30 MED ORDER — LACTATED RINGERS IV SOLN: INTRAVENOUS | Status: DC

## 2024-09-30 SURGICAL SUPPLY — 30 items
BENZOIN TINCTURE PRP APPL 2/3 (GAUZE/BANDAGES/DRESSINGS) IMPLANT
CHLORAPREP W/TINT 26 (MISCELLANEOUS) ×2 IMPLANT
CLAMP UMBILICAL CORD (MISCELLANEOUS) ×1 IMPLANT
CLSR STERI-STRIP ANTIMIC 1/2X4 (GAUZE/BANDAGES/DRESSINGS) IMPLANT
DRSG OPSITE POSTOP 4X10 (GAUZE/BANDAGES/DRESSINGS) ×1 IMPLANT
ELECTRODE REM PT RTRN 9FT ADLT (ELECTROSURGICAL) ×1 IMPLANT
EXTRACTOR VACUUM M CUP 4 TUBE (SUCTIONS) IMPLANT
GAUZE PAD ABD 7.5X8 STRL (GAUZE/BANDAGES/DRESSINGS) IMPLANT
GAUZE SPONGE 4X4 12PLY STRL LF (GAUZE/BANDAGES/DRESSINGS) IMPLANT
GLOVE BIOGEL PI IND STRL 6.5 (GLOVE) ×1 IMPLANT
GLOVE BIOGEL PI IND STRL 7.0 (GLOVE) ×1 IMPLANT
GLOVE SURG SS PI 6.5 STRL IVOR (GLOVE) ×1 IMPLANT
GOWN STRL REUS W/TWL LRG LVL3 (GOWN DISPOSABLE) ×2 IMPLANT
KIT ABG SYR 3ML LUER SLIP (SYRINGE) IMPLANT
MAT PREVALON FULL STRYKER (MISCELLANEOUS) IMPLANT
NDL HYPO 25X5/8 SAFETYGLIDE (NEEDLE) IMPLANT
NEEDLE HYPO 25X5/8 SAFETYGLIDE (NEEDLE) IMPLANT
NS IRRIG 1000ML POUR BTL (IV SOLUTION) ×1 IMPLANT
PACK C SECTION WH (CUSTOM PROCEDURE TRAY) ×1 IMPLANT
PAD OB MATERNITY 4.3X12.25 (PERSONAL CARE ITEMS) ×1 IMPLANT
RTRCTR C-SECT PINK 25CM LRG (MISCELLANEOUS) IMPLANT
STRIP CLOSURE SKIN 1/2X4 (GAUZE/BANDAGES/DRESSINGS) IMPLANT
SUT PLAIN 0 NONE (SUTURE) IMPLANT
SUT VIC AB 0 CT1 36 (SUTURE) ×4 IMPLANT
SUT VIC AB 4-0 KS 27 (SUTURE) ×1 IMPLANT
SUTURE PLAIN GUT 2.0 ETHICON (SUTURE) IMPLANT
TAPE CLOTH SURG 4X10 WHT LF (GAUZE/BANDAGES/DRESSINGS) IMPLANT
TOWEL OR 17X24 6PK STRL BLUE (TOWEL DISPOSABLE) ×1 IMPLANT
TRAY FOLEY W/BAG SLVR 14FR LF (SET/KITS/TRAYS/PACK) ×1 IMPLANT
WATER STERILE IRR 1000ML POUR (IV SOLUTION) ×1 IMPLANT

## 2024-09-30 NOTE — MAU Note (Signed)
 Ellen Huffman is a 23 y.o. at [redacted]w[redacted]d here in MAU reporting: she took BP while @ home and BP was elevated, reports BP 155/98 & 150/101. Denies HA, visual disturbances, and epigastric pain.   Denies VB or LOF.  Reports +FM.  LMP: 12/15/2023 Onset of complaint: today Pain score: 0 Vitals:   09/30/24 1632  BP: (!) 155/90  Pulse: 95  Resp: 18  Temp: 98.8 F (37.1 C)  SpO2: 100%     FHT: 148 bpm  Lab orders placed from triage: UA

## 2024-09-30 NOTE — MAU Provider Note (Signed)
 History     CSN: [id number]  Arrival date and time: 09/30/24 1604   Event Date/Time   First Provider Initiated Contact with Patient 09/30/24 1710      Chief Complaint  Patient presents with   BP Evaluation   HPI  Ellen Huffman is a 23 y.o. G4P0030 at [redacted]w[redacted]d who presents for evaluation of elevated blood pressures. Patient reports she was watching TV when she noticed she was seeing spots in her vision that looked like static. She reports she checked her BP at home and it was 150s/90s. She called the office and was told to come for evaluation. She denies any headache or epigastric pain. She denies any vaginal bleeding, discharge, and leaking of fluid. Denies any constipation, diarrhea or any urinary complaints. Reports normal fetal movement.   OB History     Gravida  4   Para      Term      Preterm      AB  3   Living         SAB  3   IAB      Ectopic      Multiple      Live Births              Past Medical History:  Diagnosis Date   ADHD (attention deficit hyperactivity disorder)    Allergy    Anxiety    Bipolar disorder (HCC)    Depression    Mental disorder    Overdose 12/25/2013   Seizures (HCC) 2022   drug withdrawl    Past Surgical History:  Procedure Laterality Date   DILATION AND CURETTAGE OF UTERUS N/A 09/27/2021   Procedure: DILATATION AND CURETTAGE WITH FROZEN WITH ULTRA SOUND;  Surgeon: Izell Harari, MD;  Location: [facility] OR;  Service: Gynecology;  Laterality: N/A;   DILATION AND EVACUATION N/A 12/27/2022   Procedure: DILATATION AND EVACUATION WITH ANORA GENETIC TESTING;  Surgeon: Izell Harari, MD;  Location: [facility] OR;  Service: Gynecology;  Laterality: N/A;    Family History  Problem Relation Age of Onset   Lupus Mother    Hyperlipidemia Father    Migraines Neg Hx    Parkinsonism Neg Hx    Seizures Neg Hx    ADD / ADHD Neg Hx    Bipolar disorder Neg Hx     Social History   Tobacco Use   Smoking status: Former    Types:  Cigarettes    Passive exposure: Never   Smokeless tobacco: Never   Tobacco comments:    Quit Jan 2023    quit vaping with preg  Vaping Use   Vaping status: Former   Substances: Nicotine , Flavoring  Substance Use Topics   Alcohol use: Not Currently    Comment: drinks on the weekends   Drug use: Not Currently    Types: Marijuana, Cocaine    Comment: last used in 2022    Allergies:  Allergies  Allergen Reactions   Penicillins Rash    Medications Prior to Admission  Medication Sig Dispense Refill Last Dose/Taking   aspirin  EC 81 MG tablet Take 2 tablets (162 mg total) by mouth daily. 60 tablet 6 Past Week   Prenatal Vit-Fe Fumarate-FA (M-NATAL PLUS ) 27-1 MG TABS Take 1 tablet by mouth daily. 30 tablet 11 09/30/2024   Blood Pressure Monitor MISC For regular home bp monitoring during pregnancy 1 each 0     Review of Systems  Constitutional: Negative.  Negative for fatigue and fever.  HENT: Negative.    Respiratory: Negative.  Negative for shortness of breath.   Cardiovascular: Negative.  Negative for chest pain.  Gastrointestinal: Negative.  Negative for abdominal pain, constipation, diarrhea, nausea and vomiting.  Genitourinary: Negative.  Negative for dysuria.  Neurological: Negative.  Negative for dizziness and headaches.   Physical Exam   Blood pressure (!) 171/89, pulse (!) 101, temperature 98.8 F (37.1 C), temperature source Oral, resp. rate 18, height 5' 6 (1.676 m), weight 107.6 kg, last menstrual period 12/15/2023, SpO2 100%, unknown if currently breastfeeding.  Patient Vitals for the past 24 hrs:  BP Temp Temp src Pulse Resp SpO2 Height Weight  09/30/24 1806 (!) 171/89 -- -- (!) 101 -- -- -- --  09/30/24 1746 (!) 163/90 -- -- 93 -- -- -- --  09/30/24 1731 (!) 154/92 -- -- 91 -- -- -- --  09/30/24 1700 (!) 157/90 -- -- 89 -- -- -- --  09/30/24 1657 (!) 159/95 -- -- 91 -- -- -- --  09/30/24 1632 (!) 155/90 98.8 F (37.1 C) Oral 95 18 100 % -- --  09/30/24 1627  -- -- -- -- -- -- 5' 6 (1.676 m) 107.6 kg    Physical Exam Vitals and nursing note reviewed.  Constitutional:      General: She is not in acute distress.    Appearance: She is well-developed.  HENT:     Head: Normocephalic.  Eyes:     Pupils: Pupils are equal, round, and reactive to light.  Cardiovascular:     Rate and Rhythm: Normal rate and regular rhythm.     Heart sounds: Normal heart sounds.  Pulmonary:     Effort: Pulmonary effort is normal. No respiratory distress.     Breath sounds: Normal breath sounds.  Abdominal:     General: Bowel sounds are normal. There is no distension.     Palpations: Abdomen is soft.     Tenderness: There is no abdominal tenderness.  Skin:    General: Skin is warm and dry.  Neurological:     Mental Status: She is alert and oriented to person, place, and time.     Motor: No abnormal muscle tone.     Coordination: Coordination normal.     Deep Tendon Reflexes: Reflexes are normal and symmetric. Reflexes normal.  Psychiatric:        Behavior: Behavior normal.        Thought Content: Thought content normal.        Judgment: Judgment normal.     Fetal Tracing:  Baseline: 145 Variability:moderate Accels: 15x15 Decels: 1 prolonged x6 minutes, resolved with position change and IV fluids  Toco: none  MAU Course  Procedures  Results for orders placed or performed during the hospital encounter of 09/30/24 (from the past 24 hours)  Protein / creatinine ratio, urine     Status: None   Collection Time: 09/30/24  4:49 PM  Result Value Ref Range   Creatinine, Urine 47 mg/dL   Total Protein, Urine <6 mg/dL   Protein Creatinine Ratio        0.00 - 0.15 mg/mg[Cre]  Urinalysis, Routine w reflex microscopic -Urine, Clean Catch     Status: Abnormal   Collection Time: 09/30/24  4:50 PM  Result Value Ref Range   Color, Urine YELLOW YELLOW   APPearance HAZY (A) CLEAR   Specific Gravity, Urine 1.008 1.005 - 1.030   pH 7.0 5.0 - 8.0   Glucose, UA  NEGATIVE NEGATIVE mg/dL  Hgb urine dipstick NEGATIVE NEGATIVE   Bilirubin Urine NEGATIVE NEGATIVE   Ketones, ur NEGATIVE NEGATIVE mg/dL   Protein, ur NEGATIVE NEGATIVE mg/dL   Nitrite NEGATIVE NEGATIVE   Leukocytes,Ua NEGATIVE NEGATIVE  Type and screen [facility]     Status: None (Preliminary result)   Collection Time: 09/30/24  6:00 PM  Result Value Ref Range   ABO/RH(D) PENDING    Antibody Screen PENDING    Sample Expiration      10/03/2024,2359 Performed at [facility], [address] [place], [place] [address]     MDM Labs ordered and reviewed.   CBC, CMP, Protein/creat ratio Type and Screen NPO IV placed  Dr. Alger notified of patient arrival and diagnosis of GHTN. Will wait for preeclampsia labs to result.   1746: initial severe range BP 1806: repeat severe range BP, preeclampsia order set placed.  Pt informed that the ultrasound is considered a limited OB ultrasound and is not intended to be a complete ultrasound exam.  Patient also informed that the ultrasound is not being completed with the intent of assessing for fetal or placental anomalies or any pelvic abnormalities.  Explained that the purpose of today's ultrasound is to assess for  presentation.  Patient acknowledges the purpose of the exam and the limitations of the study.    Breech presentation confirmed.   Dr. Alger notified of second severe range BP. Will come see patient in MAU.    Assessment and Plan   1. Pre-eclampsia in third trimester   2. [redacted] weeks gestation of pregnancy    -Care turned over to MD  Aleck CHRISTELLA Fireman, CNM 09/30/2024, 5:10 PM

## 2024-09-30 NOTE — Transfer of Care (Signed)
 Immediate Anesthesia Transfer of Care Note  Patient: Ellen Huffman  Procedure(s) Performed: CESAREAN DELIVERY  Patient Location: PACU  Anesthesia Type:Spinal  Level of Consciousness: awake  Airway & Oxygen Therapy: Patient Spontanous Breathing  Post-op Assessment: Report given to RN and Post -op Vital signs reviewed and stable  Post vital signs: Reviewed and stable  Last Vitals:  Vitals Value Taken Time  BP 132/67 09/30/24 21:22  Temp    Pulse 88 09/30/24 21:24  Resp 18 09/30/24 21:24  SpO2 97 % 09/30/24 21:24  Vitals shown include unfiled device data.  Last Pain:  Vitals:   09/30/24 1632  TempSrc: Oral  PainSc:          Complications: No notable events documented.

## 2024-09-30 NOTE — Op Note (Signed)
 Ellen Huffman PROCEDURE DATE: 09/30/2024  PREOPERATIVE DIAGNOSIS: Intrauterine pregnancy at  [redacted]w[redacted]d weeks gestation; malpresentation: frank breech  POSTOPERATIVE DIAGNOSIS: The same  PROCEDURE:     Cesarean Section  SURGEON:  Dr. Winton Felt  ASSISTANT: Dr. Jomarie  An experienced assistant was required given the standard of surgical care given the complexity of the case.  This assistant was needed for exposure, dissection, suctioning, retraction, instrument exchange, assisting with delivery with administration of fundal pressure, and for overall help during the procedure.   INDICATIONS: Ellen Huffman is a 23 y.o. H5E8968 at [redacted]w[redacted]d scheduled for cesarean section secondary to malpresentation: frank breech.  The risks of cesarean section discussed with the patient included but were not limited to: bleeding which may require transfusion or reoperation; infection which may require antibiotics; injury to bowel, bladder, ureters or other surrounding organs; injury to the fetus; need for additional procedures including hysterectomy in the event of a life-threatening hemorrhage; placental abnormalities wth subsequent pregnancies, incisional problems, thromboembolic phenomenon and other postoperative/anesthesia complications. The patient concurred with the proposed plan, giving informed written consent for the procedure.    FINDINGS:  Viable female infant in breech presentation.  Apgars 8 and 9, weight, 6 pounds and 8.8 ounces.  Clear amniotic fluid.  Intact placenta, three vessel cord.  Normal uterus, fallopian tubes and ovaries bilaterally.  ANESTHESIA:    Spinal INTRAVENOUS FLUIDS:1200 ml ESTIMATED BLOOD LOSS: 541 ml URINE OUTPUT:  125 ml SPECIMENS: Placenta sent to L&D COMPLICATIONS: None immediate  PROCEDURE IN DETAIL:  The patient received intravenous antibiotics and had sequential compression devices applied to her lower extremities while in the preoperative area.  She was then taken to the  operating room where anesthesia was induced and was found to be adequate. A foley catheter was placed into her bladder and attached to Ellen Huffman gravity. She was then placed in a dorsal supine position with a leftward tilt, and prepped and draped in a sterile manner. After an adequate timeout was performed, a Pfannenstiel skin incision was made with scalpel and carried through to the underlying layer of fascia. The fascia was incised in the midline and this incision was extended bilaterally bluntly. The rectus muscles were separated in the midline bluntly and the peritoneum was entered bluntly. The Alexis self-retaining retractor was introduced into the abdominal cavity. Attention was turned to the lower uterine segment where a transverse hysterotomy was made with a scalpel and extended bilaterally bluntly. The infant was successfully delivered. Delayed cord clamping was performed for 1 minute and the cord was clamped and cut and infant was handed over to awaiting neonatology team. Uterine massage was then administered and the placenta delivered intact with three-vessel cord. The uterus was cleared of clot and debris.  The hysterotomy was closed with 0 Vicryl in a running locked fashion. Overall, excellent hemostasis was noted. The pelvis was cleared of all clot and debris. Hemostasis was confirmed on all surfaces.  The peritoneum and the muscles were reapproximated using 0 vicryl interrupted stitches. The fascia was then closed using 0 Vicryl in a running fashion.  The subcutaneous layer was reapproximated with plain gut and the skin was closed in a subcuticular fashion using 3.0 Vicryl. The patient tolerated the procedure well. Sponge, lap, instrument and needle counts were correct x 2. She was taken to the recovery room in stable condition.    Ellen Huffman ConstantMD  09/30/2024 8:55 PM

## 2024-09-30 NOTE — Discharge Summary (Signed)
 Postpartum Discharge Summary  Date of Service updated***     Patient Name: Ellen Huffman DOB: 2001/01/10 MRN: [id number]  Date of admission: 09/30/2024 Delivery date:09/30/2024 Delivering provider: Keerstin Bjelland Date of discharge: 09/30/2024  Admitting diagnosis: Severe preeclampsia, third trimester [O14.13] Intrauterine pregnancy: [redacted]w[redacted]d     Secondary diagnosis:  Principal Problem:   Severe preeclampsia, third trimester Active Problems:   Breech presentation delivered  Additional problems: ***    Discharge diagnosis: Term Pregnancy Delivered and Preeclampsia (severe)                                              Post partum procedures:{Postpartum procedures:23558} Augmentation: N/A Complications: None  Hospital course: Sceduled C/S   23 y.o. yo H5E8968 at [redacted]w[redacted]d was admitted to the hospital 09/30/2024 for unscheduled, urgent cesarean section with the following indication:Malpresentation and PEC w/ severe features.Delivery details are as follows:  Membrane Rupture Time/Date: 8:24 PM,09/30/2024  Delivery Method:C-Section, Low Transverse Operative Delivery:N/A Details of operation can be found in separate operative note.  Patient had a postpartum course complicated by***.  She is ambulating, tolerating a regular diet, passing flatus, and urinating well. Patient is discharged home in stable condition on  09/30/24        Newborn Data: Birth date:09/30/2024 Birth time:8:27 PM Gender:Female Living status:Living Apgars:8 ,9  Weight:2970 g    Magnesium Sulfate received: Yes: Seizure prophylaxis BMZ received: No Rhophylac:N/A MMR:N/A T-DaP:Given prenatally Flu: Yes RSV Vaccine received: No Transfusion:{Transfusion received:30440034}  Immunizations received: Immunization History  Administered Date(s) Administered   Tdap 07/07/2024    Physical exam  Vitals:   09/30/24 1831 09/30/24 1920 09/30/24 1931 09/30/24 1943  BP: 129/74 (!) 151/84 (!) 144/68 (!) 151/86  Pulse: 95 (!)  104 (!) 122 (!) 132  Resp:      Temp:      TempSrc:      SpO2:      Weight:      Height:       General: {Exam; general:21111117} Lochia: {Desc; appropriate/inappropriate:30686::appropriate} Uterine Fundus: {Desc; firm/soft:30687} Incision: {Exam; incision:21111123} DVT Evaluation: {Exam; dvt:2111122} Labs: Lab Results  Component Value Date   WBC 10.2 09/30/2024   HGB 13.3 09/30/2024   HCT 38.9 09/30/2024   MCV 92.0 09/30/2024   PLT 238 09/30/2024      Latest Ref Rng & Units 09/30/2024    6:05 PM  CMP  Glucose 70 - 99 mg/dL 80   BUN 6 - 20 mg/dL 5   Creatinine 9.55 - 8.99 mg/dL 9.48   Sodium 864 - 854 mmol/L 139   Potassium 3.5 - 5.1 mmol/L 3.5   Chloride 98 - 111 mmol/L 107   CO2 22 - 32 mmol/L 21   Calcium 8.9 - 10.3 mg/dL 9.3   Total Protein 6.5 - 8.1 g/dL 7.0   Total Bilirubin 0.0 - 1.2 mg/dL 0.5   Alkaline Phos 38 - 126 U/L 84   AST 15 - 41 U/L 19   ALT 0 - 44 U/L 20    Edinburgh Score:     No data to display         No data recorded  After visit meds:  Allergies as of 09/30/2024       Reactions   Penicillins Rash     Med Rec must be completed prior to using this [facility]***  Discharge home in stable condition Infant Feeding: Breast Infant Disposition:home with mother Discharge instruction: per After Visit Summary and Postpartum booklet. Activity: Advance as tolerated. Pelvic rest for 6 weeks.  Diet: routine diet Future Appointments: Future Appointments  Date Time Provider Department Center  10/10/2024 10:10 AM Delores Nidia CROME, FNP [facility]   Follow up Visit:   Please schedule this patient for a In person postpartum visit in 6 weeks with the following provider: Any provider. Additional Postpartum F/U:Incision check 1 week and BP check 1 week  High risk pregnancy complicated by: PEC w/ SF Delivery mode:  C-Section, Low Transverse Anticipated Birth Control:  condoms +/- Phexxi  Message sent to [facility]  10/7  09/30/2024 Charlie DELENA Courts, MD

## 2024-09-30 NOTE — Anesthesia Procedure Notes (Signed)
 Spinal  Patient location during procedure: OB Start time: 09/30/2024 7:56 PM End time: 09/30/2024 8:01 PM Reason for block: surgical anesthesia Staffing Performed: anesthesiologist  Anesthesiologist: Cleotilde Butler Dade, MD Performed by: Cleotilde Butler Dade, MD Authorized by: Cleotilde Butler Dade, MD   Preanesthetic Checklist Completed: patient identified, IV checked, risks and benefits discussed, surgical consent, monitors and equipment checked, pre-op evaluation and timeout performed Spinal Block Patient position: sitting Prep: DuraPrep and site prepped and draped Patient monitoring: heart rate, cardiac monitor, continuous pulse ox and blood pressure Approach: midline Location: L3-4 Injection technique: single-shot Needle Needle type: Pencan  Needle gauge: 24 G Needle length: 10 cm Assessment Sensory level: T4 Events: CSF return

## 2024-09-30 NOTE — Lactation Note (Signed)
 This note was copied from a baby's chart. Lactation Consultation Note  Patient Name: Ellen Huffman Unijb'd Date: 09/30/2024 Age:23 hours Reason for consult: Initial assessment;1st time breastfeeding;Term see MOB: MR- C/S delivery and pre-eclampsia .  Per MOB, she attempted latch infant in recovery but infant did not latch. MOB latched infant on her left breast with pillow support using the football hold position, infant latched with depth and breastfeed for 15 minutes. LC discussed hand expression with breast model and MOB taught back did self expression, infant given 5 mls of colostrum by spoon. MOB was doing skin to skin with infant when LC left the room. Per MOB she was leaking colostrum in her pregnancy, MOB milk flows well when expressing. MOB will continue to breastfeed infant by cues, on demand, 8-12 times within 24 hours, skin to skin. MOB knows to call for further latch assistance if needed. LC discussed importance of maternal rest, meals and hydration. MOB was made aware of O/P services, breastfeeding support groups, community resources, and our phone # for post-discharge questions.    Maternal Data Has patient been taught Hand Expression?: Yes Does the patient have breastfeeding experience prior to this delivery?: No  Feeding Mother's Current Feeding Choice: Breast Milk  LATCH Score Latch: Grasps breast easily, tongue down, lips flanged, rhythmical sucking.  Audible Swallowing: A few with stimulation  Type of Nipple: Everted at rest and after stimulation  Comfort (Breast/Nipple): Soft / non-tender  Hold (Positioning): Assistance needed to correctly position infant at breast and maintain latch.  LATCH Score: 8   Lactation Tools Discussed/Used    Interventions Interventions: Breast feeding basics reviewed;Assisted with latch;Skin to skin;Breast compression;Hand express;Adjust position;Support pillows;Position options;Expressed milk;Education;Guidelines for Milk Supply and  Pumping Schedule Handout;LC Services brochure;CDC milk storage guidelines;CDC Guidelines for Breast Pump Cleaning  Discharge Pump: DEBP;Personal  Consult Status Consult Status: Follow-up Date: 10/01/24 Follow-up type: In-patient    Grayce LULLA Batter 09/30/2024, 11:19 PM

## 2024-09-30 NOTE — Anesthesia Preprocedure Evaluation (Addendum)
 Anesthesia Evaluation  Patient identified by MRN, date of birth, ID band Patient awake    Reviewed: Allergy & Precautions, H&P , NPO status , Patient's Chart, lab work & pertinent test results  Airway Mallampati: II  TM Distance: >3 FB Neck ROM: Full    Dental  (+) Teeth Intact, Dental Advisory Given   Pulmonary former smoker   breath sounds clear to auscultation       Cardiovascular hypertension,  Rhythm:Regular Rate:Normal     Neuro/Psych  Headaches, Seizures -,  PSYCHIATRIC DISORDERS Anxiety Depression Bipolar Disorder   negative neurological ROS  negative psych ROS   GI/Hepatic negative GI ROS, Neg liver ROS,,,  Endo/Other  negative endocrine ROS    Renal/GU negative Renal ROS  negative genitourinary   Musculoskeletal negative musculoskeletal ROS (+)    Abdominal  (+) + obese  Peds negative pediatric ROS (+)  Hematology negative hematology ROS (+)   Anesthesia Other Findings   Reproductive/Obstetrics (+) Pregnancy                              Anesthesia Physical Anesthesia Plan  ASA: 2 and emergent  Anesthesia Plan: General   Post-op Pain Management:    Induction:   PONV Risk Score and Plan: 2 and Treatment may vary due to age or medical condition  Airway Management Planned: Natural Airway  Additional Equipment: None  Intra-op Plan:   Post-operative Plan:   Informed Consent: I have reviewed the patients History and Physical, chart, labs and discussed the procedure including the risks, benefits and alternatives for the proposed anesthesia with the patient or authorized representative who has indicated his/her understanding and acceptance.     Dental advisory given  Plan Discussed with: CRNA  Anesthesia Plan Comments:         Anesthesia Quick Evaluation

## 2024-09-30 NOTE — H&P (Signed)
 History     CSN: [id number]  Arrival date and time: 09/30/24 1604   Event Date/Time   First Provider Initiated Contact with Patient 09/30/24 1710      Chief Complaint  Patient presents with   BP Evaluation   HPI  Ellen Huffman is a 23 y.o. G4P0030 at [redacted]w[redacted]d who presents for evaluation of elevated blood pressures. Patient reports she was watching TV when she noticed she was seeing spots in her vision that looked like static. She reports she checked her BP at home and it was 150s/90s. She called the office and was told to come for evaluation. She denies any headache or epigastric pain. She denies any vaginal bleeding, discharge, and leaking of fluid. Denies any constipation, diarrhea or any urinary complaints. Reports normal fetal movement.   OB History     Gravida  4   Para      Term      Preterm      AB  3   Living         SAB  3   IAB      Ectopic      Multiple      Live Births              Past Medical History:  Diagnosis Date   ADHD (attention deficit hyperactivity disorder)    Allergy    Anxiety    Bipolar disorder (HCC)    Depression    Mental disorder    Overdose 12/25/2013   Seizures (HCC) 2022   drug withdrawl    Past Surgical History:  Procedure Laterality Date   DILATION AND CURETTAGE OF UTERUS N/A 09/27/2021   Procedure: DILATATION AND CURETTAGE WITH FROZEN WITH ULTRA SOUND;  Surgeon: Izell Harari, MD;  Location: [facility] OR;  Service: Gynecology;  Laterality: N/A;   DILATION AND EVACUATION N/A 12/27/2022   Procedure: DILATATION AND EVACUATION WITH ANORA GENETIC TESTING;  Surgeon: Izell Harari, MD;  Location: [facility] OR;  Service: Gynecology;  Laterality: N/A;    Family History  Problem Relation Age of Onset   Lupus Mother    Hyperlipidemia Father    Migraines Neg Hx    Parkinsonism Neg Hx    Seizures Neg Hx    ADD / ADHD Neg Hx    Bipolar disorder Neg Hx     Social History   Tobacco Use   Smoking status: Former    Types:  Cigarettes    Passive exposure: Never   Smokeless tobacco: Never   Tobacco comments:    Quit Jan 2023    quit vaping with preg  Vaping Use   Vaping status: Former   Substances: Nicotine , Flavoring  Substance Use Topics   Alcohol use: Not Currently    Comment: drinks on the weekends   Drug use: Not Currently    Types: Marijuana, Cocaine    Comment: last used in 2022    Allergies:  Allergies  Allergen Reactions   Penicillins Rash    Medications Prior to Admission  Medication Sig Dispense Refill Last Dose/Taking   aspirin  EC 81 MG tablet Take 2 tablets (162 mg total) by mouth daily. 60 tablet 6 Past Week   Prenatal Vit-Fe Fumarate-FA (M-NATAL PLUS ) 27-1 MG TABS Take 1 tablet by mouth daily. 30 tablet 11 09/30/2024   Blood Pressure Monitor MISC For regular home bp monitoring during pregnancy 1 each 0     Review of Systems  Constitutional: Negative.  Negative for fatigue and fever.  HENT: Negative.    Respiratory: Negative.  Negative for shortness of breath.   Cardiovascular: Negative.  Negative for chest pain.  Gastrointestinal: Negative.  Negative for abdominal pain, constipation, diarrhea, nausea and vomiting.  Genitourinary: Negative.  Negative for dysuria.  Neurological: Negative.  Negative for dizziness and headaches.   Physical Exam   Blood pressure (!) 171/89, pulse (!) 101, temperature 98.8 F (37.1 C), temperature source Oral, resp. rate 18, height 5' 6 (1.676 m), weight 107.6 kg, last menstrual period 12/15/2023, SpO2 100%, unknown if currently breastfeeding.  Patient Vitals for the past 24 hrs:  BP Temp Temp src Pulse Resp SpO2 Height Weight  09/30/24 1806 (!) 171/89 -- -- (!) 101 -- -- -- --  09/30/24 1746 (!) 163/90 -- -- 93 -- -- -- --  09/30/24 1731 (!) 154/92 -- -- 91 -- -- -- --  09/30/24 1700 (!) 157/90 -- -- 89 -- -- -- --  09/30/24 1657 (!) 159/95 -- -- 91 -- -- -- --  09/30/24 1632 (!) 155/90 98.8 F (37.1 C) Oral 95 18 100 % -- --  09/30/24 1627  -- -- -- -- -- -- 5' 6 (1.676 m) 107.6 kg    Physical Exam Vitals and nursing note reviewed.  Constitutional:      General: She is not in acute distress.    Appearance: She is well-developed.  HENT:     Head: Normocephalic.  Eyes:     Pupils: Pupils are equal, round, and reactive to light.  Cardiovascular:     Rate and Rhythm: Normal rate and regular rhythm.     Heart sounds: Normal heart sounds.  Pulmonary:     Effort: Pulmonary effort is normal. No respiratory distress.     Breath sounds: Normal breath sounds.  Abdominal:     General: Bowel sounds are normal. There is no distension.     Palpations: Abdomen is soft.     Tenderness: There is no abdominal tenderness.  Skin:    General: Skin is warm and dry.  Neurological:     Mental Status: She is alert and oriented to person, place, and time.     Motor: No abnormal muscle tone.     Coordination: Coordination normal.     Deep Tendon Reflexes: Reflexes are normal and symmetric. Reflexes normal.  Psychiatric:        Behavior: Behavior normal.        Thought Content: Thought content normal.        Judgment: Judgment normal.     Fetal Tracing:  Baseline: 145 Variability:moderate Accels: 15x15 Decels: 1 prolonged x6 minutes, resolved with position change and IV fluids  Toco: none  MAU Course  Procedures  Results for orders placed or performed during the hospital encounter of 09/30/24 (from the past 24 hours)  Protein / creatinine ratio, urine     Status: None   Collection Time: 09/30/24  4:49 PM  Result Value Ref Range   Creatinine, Urine 47 mg/dL   Total Protein, Urine <6 mg/dL   Protein Creatinine Ratio        0.00 - 0.15 mg/mg[Cre]  Urinalysis, Routine w reflex microscopic -Urine, Clean Catch     Status: Abnormal   Collection Time: 09/30/24  4:50 PM  Result Value Ref Range   Color, Urine YELLOW YELLOW   APPearance HAZY (A) CLEAR   Specific Gravity, Urine 1.008 1.005 - 1.030   pH 7.0 5.0 - 8.0   Glucose, UA  NEGATIVE NEGATIVE mg/dL  Hgb urine dipstick NEGATIVE NEGATIVE   Bilirubin Urine NEGATIVE NEGATIVE   Ketones, ur NEGATIVE NEGATIVE mg/dL   Protein, ur NEGATIVE NEGATIVE mg/dL   Nitrite NEGATIVE NEGATIVE   Leukocytes,Ua NEGATIVE NEGATIVE  Type and screen [facility]     Status: None (Preliminary result)   Collection Time: 09/30/24  6:00 PM  Result Value Ref Range   ABO/RH(D) PENDING    Antibody Screen PENDING    Sample Expiration      10/03/2024,2359 Performed at [facility], [address] [place], [place] [address]     MDM Labs ordered and reviewed.   CBC, CMP, Protein/creat ratio Type and Screen NPO IV placed  Dr. Alger notified of patient arrival and diagnosis of GHTN. Will wait for preeclampsia labs to result.   1746: initial severe range BP 1806: repeat severe range BP, preeclampsia order set placed.  Pt informed that the ultrasound is considered a limited OB ultrasound and is not intended to be a complete ultrasound exam.  Patient also informed that the ultrasound is not being completed with the intent of assessing for fetal or placental anomalies or any pelvic abnormalities.  Explained that the purpose of today's ultrasound is to assess for  presentation.  Patient acknowledges the purpose of the exam and the limitations of the study.    Breech presentation confirmed.   Dr. Alger notified of second severe range BP. Will come see patient in MAU.    Assessment and Plan   1. Pre-eclampsia in third trimester   2. [redacted] weeks gestation of pregnancy    -Care turned over to MD  Aleck CHRISTELLA Fireman, CNM 09/30/2024, 5:10 PM

## 2024-10-01 LAB — CBC
HCT: 33.2 % — ABNORMAL LOW (ref 36.0–46.0)
Hemoglobin: 11.5 g/dL — ABNORMAL LOW (ref 12.0–15.0)
MCH: 31.4 pg (ref 26.0–34.0)
MCHC: 34.6 g/dL (ref 30.0–36.0)
MCV: 90.7 fL (ref 80.0–100.0)
Platelets: 234 K/uL (ref 150–400)
RBC: 3.66 MIL/uL — ABNORMAL LOW (ref 3.87–5.11)
RDW: 13 % (ref 11.5–15.5)
WBC: 14.4 K/uL — ABNORMAL HIGH (ref 4.0–10.5)
nRBC: 0 % (ref 0.0–0.2)

## 2024-10-01 LAB — TYPE AND SCREEN
ABO/RH(D): A POS
Antibody Screen: NEGATIVE

## 2024-10-01 MED ORDER — LACTATED RINGERS IV SOLN: INTRAVENOUS | Status: AC

## 2024-10-01 NOTE — Lactation Note (Addendum)
 This note was copied from a baby's chart. Lactation Consultation Note  Patient Name: Ellen Huffman Unijb'd Date: 10/01/2024 Age:23 hours Reason for consult: Follow-up assessment;Primapara;1st time breastfeeding;Other (Comment);Term (GHTN on MgSO4, Bipolar disorder, prior Hx of substance abuse, clean since 2022)  LC in to visit with P1 Mom of term baby delivered by C/Section for breech position.  Baby has latched to the breast twice, and had two colostrum supplements of 5 ml each, along with one formula feeding of 20 ml this morning.  Baby not acting hungry.  LC recommended baby be STS and Mom agreeable.  LC unwrapped baby and placed in in laid back prone position.  No feeding cues noted when LC moved baby onto breast.  Night RN set up the pump, Mom has been using 24 mm flanges, recommended 21 mm for a better fit.  LC set up washing and drying basins and educated on importance of washing, rinsing and air drying disassembled parts after each use.  Mom has just pumped and LC noted drops of colostrum in the colostrum protector.  LC assisted Mom to finger feed these to baby.  Baby not cueing, so Mom placed drops on baby's mouth.  Encouraged Mom to continue the STS and reviewed normal term newborn feeding behavior.  Reassured her that baby would start showing cues by the end of her first day of life.  Until then, hand expression and pumping would be beneficial breast stimulation and nutrition for baby.   Plan recommended (and written on dry erase board) 1- STS with baby as much as possible 2- Offer the breast with feeding cues, asking for help prn 3- Hand express/pump every 3 hrs and feed back any colostrum expressed. 4- ask for LC prn    LATCH Score Latch: Too sleepy or reluctant, no latch achieved, no sucking elicited.  Audible Swallowing: None  Type of Nipple: Everted at rest and after stimulation  Comfort (Breast/Nipple): Soft / non-tender  Hold (Positioning): Full assist, staff holds  infant at breast  LATCH Score: 4   Lactation Tools Discussed/Used Tools: Flanges;Pump;Bottle Flange Size: 21 Breast pump type: Double-Electric Breast Pump Pump Education: Setup, frequency, and cleaning;Milk Storage Reason for Pumping: support milk supply Pumping frequency: PRN Pumped volume: 5 mL  Interventions Interventions: Breast feeding basics reviewed;Skin to skin;Breast massage;Hand express;Support pillows;DEBP;Education  Discharge Pump: Hands Free;Personal (Zomee hands free pump from insurance)  Consult Status Consult Status: Follow-up Date: 10/02/24 Follow-up type: In-patient    Claudene Aleck BRAVO 10/01/2024, 11:09 AM

## 2024-10-01 NOTE — Progress Notes (Signed)
 Subjective: Postpartum Day 1: Cesarean Delivery Patient reports incisional pain, tolerating PO, and no problems voiding.    Objective: Vital signs in last 24 hours: Temp:  [97.4 F (36.3 C)-99 F (37.2 C)] 98.1 F (36.7 C) (10/08 0734) Pulse Rate:  [78-132] 78 (10/08 0734) Resp:  [14-24] 16 (10/08 0900) BP: (110-171)/(67-95) 110/72 (10/08 0734) SpO2:  [89 %-100 %] 95 % (10/08 0734) Weight:  [107.6 kg] 107.6 kg (10/07 1627)  Physical Exam:  General: alert, cooperative, and no distress Lochia: appropriate Uterine Fundus: firm Incision: no significant drainage DVT Evaluation: No evidence of DVT seen on physical exam.  Recent Labs    09/30/24 1805 10/01/24 0516  HGB 13.3 11.5*  HCT 38.9 33.2*    Assessment/Plan: Status post Cesarean section. Doing well postoperatively.  Continue current care.  Lynwood Solomons, MD 10/01/2024, 10:18 AM

## 2024-10-01 NOTE — Anesthesia Postprocedure Evaluation (Signed)
 Anesthesia Post Note  Patient: Ellen Huffman  Procedure(s) Performed: CESAREAN DELIVERY     Patient location during evaluation: PACU Anesthesia Type: Spinal Level of consciousness: awake and alert Pain management: pain level controlled Vital Signs Assessment: post-procedure vital signs reviewed and stable Respiratory status: spontaneous breathing, nonlabored ventilation and respiratory function stable Cardiovascular status: blood pressure returned to baseline and stable Postop Assessment: no apparent nausea or vomiting Anesthetic complications: no   No notable events documented.  Last Vitals:  Vitals:   10/01/24 0400 10/01/24 0500  BP:    Pulse:    Resp: 18 16  Temp:    SpO2:      Last Pain:  Vitals:   10/01/24 0511  TempSrc:   PainSc: 0-No pain   Pain Goal:                   Butler Levander Pinal

## 2024-10-01 NOTE — Lactation Note (Signed)
 This note was copied from a baby's chart. Lactation Consultation Note  Patient Name: Girl Laquanta Hummel Today's Date: 10/01/2024 Age:23 hours   P1- LC checked on MOB per previous LC's request. MOB is asleep at this time. LC asked RN how infant's feedings have went recently. Per the RN, infant had just finished nursing around 2100 and ate for 20 minutes. LC team will check on MOB again at a later time.  Recardo Hoit BS, IBCLC 10/01/2024, 9:50 PM

## 2024-10-02 ENCOUNTER — Encounter: Admitting: Women's Health

## 2024-10-02 ENCOUNTER — Other Ambulatory Visit

## 2024-10-02 ENCOUNTER — Inpatient Hospital Stay (HOSPITAL_COMMUNITY): Payer: Self-pay | Admitting: Anesthesiology

## 2024-10-02 ENCOUNTER — Encounter (HOSPITAL_COMMUNITY): Payer: Self-pay | Admitting: Obstetrics and Gynecology

## 2024-10-02 MED ORDER — ACETAMINOPHEN 500 MG PO TABS
1000.0000 mg | ORAL_TABLET | Freq: Four times a day (QID) | ORAL | Status: DC
  Administered 2024-10-02 – 2024-10-03 (×3): 1000 mg via ORAL
  Filled 2024-10-02 (×4): qty 2

## 2024-10-02 MED ORDER — OXYCODONE HCL 5 MG PO TABS
5.0000 mg | ORAL_TABLET | ORAL | Status: DC | PRN
  Administered 2024-10-02 – 2024-10-03 (×2): 5 mg via ORAL
  Administered 2024-10-03 (×2): 10 mg via ORAL
  Filled 2024-10-02: qty 1
  Filled 2024-10-02: qty 2
  Filled 2024-10-02: qty 1
  Filled 2024-10-02: qty 2

## 2024-10-02 NOTE — Progress Notes (Signed)
 POSTPARTUM PROGRESS NOTE  POD #2  Subjective:  Ellen Huffman is a 23 y.o. [id number] s/p pLTCS at [redacted]w[redacted]d for breech. Today she notes that she is doing well just feeling tired. She denies any problems with ambulating, voiding or po intake. Denies nausea or vomiting. She has passing flatus, no BM.  Pain is tolerable.  Lochia moderate Denies fever/chills/chest pain/SOB.  no HA, no blurry vision, noRUQ pain  Objective: Blood pressure 126/70, pulse 75, temperature 97.7 F (36.5 C), temperature source Oral, resp. rate 18, height 5' 6 (1.676 m), weight 107.6 kg, last menstrual period 12/15/2023, SpO2 99%, unknown if currently breastfeeding.  Physical Exam:  General: alert, cooperative and no distress Chest: no respiratory distress Heart: regular rate and rhythm Abdomen: soft, nontender, +BS Uterine Fundus: firm, appropriately tender Incision: C/D/I with honeycomb DVT Evaluation: No calf swelling or tenderness Extremities: no edema Skin: warm, dry  No results found for this or any previous visit (from the past 24 hours).  Assessment/Plan: Ellen Huffman is a 23 y.o. [phone] s/p pLTCS at [redacted]w[redacted]d POD#2 complicated by: 1) preeclampsia with severe features - Status post magnesium - BP stable, currently not on any medication - Will continue to monitor  2) post operative care - Pain well-controlled - Encourage ambulation as tolerated  Contraception: condoms Feeding: breastfeeding  Dispo: Continue postoperative care as outlined above, plan for discharge home likely tomorrow   LOS: 2 days   Ellen Zender, DO Faculty Attending, [facility] [employer] 10/02/2024, 7:20 AM

## 2024-10-02 NOTE — Clinical Social Work Maternal (Signed)
 CLINICAL SOCIAL WORK MATERNAL/CHILD NOTE  Patient Details  Name: Ellen Huffman MRN: [id number] Date of Birth: 02/23/2001  Date:  12-23-2024  Clinical Social Worker Initiating Note:  Nat Quiet, [place] Date/Time: Initiated:  10/02/24/1030     Child's Name:  Ellen Huffman   Biological Parents:  Mother, Father (Mother: Marnisha Stampley 03/30/2001, Father: Lytle Lerner 06/26/1995)   Need for Interpreter:  None   Reason for Referral:  Behavioral Health Concerns   Address:  [address] 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)   Employment: Environmental education officer   Type of Work: Conservation officer, historic buildings   Education:  [employer] school graduate   Homebound arranged:    Surveyor, quantity Resources:  Medicaid   Other Resources:  [facility]   Cultural/Religious Considerations Which May Impact Care:    Strengths:  Ability to meet basic needs  , Home prepared for child  , Pediatrician chosen   Psychotropic Medications:         Pediatrician:    Sara Lee  Pediatrician List:   [facility]    [facility]  [facility]      Pediatrician Fax Number:    Risk Factors/Current Problems:  Mental Health Concerns     Cognitive State:  Alert  , Able to Concentrate     Mood/Affect:  Calm  , Comfortable     CSW Assessment: CSW received consult for hx of anxiety, depression, bipolar.  CSW met with MOB to offer support and complete assessment.    CSW met with MOB at bedside and introduced CSW  role. CSW congratulated the family on their baby girl. CSW observed MOB bonding appropriately with her infant and feeding her colostrum through a syringe. She appeared calm and engaged during the visit. Maternal grandparents were present. CSW offered MOB privacy, and the grandparents left the room. CSW asked MOB how she had been feeling. MOB shared that she had been feeling good.  CSW inquired about MOB's noted mental health history. MOB reported that she had a history of anxiety and depression. CSW inquired about the noted bipolar diagnosis. MOB reported that she had spoken with a psychiatrist a while ago regarding the bipolar 1 diagnosis. She explained that this occurred when she was in middle/high school and stated that she has not experienced symptoms consistent with bipolar 1. MOB reported experiencing symptoms of anxiety. MOB reported feeling anxious during her pregnancy and shared that she had contacted an online therapist for support through [employer] and was prescribed mental health medication for anxiety. However, she never filled the prescription and cannot recall the name of the medication that was prescribed. MOB reported now that has given birth, she is open  and willing to take medication for her mental health. MOB denied that she is experiencing current mental health concerns. CSW encouraged MOB to follow up with her medical provider if she had any concerns regarding her mental health. She reported that she feels comfortable reaching out to her medical provider if she had concerns. CSW inquired about MOB's support system. MOB identified her mom, boyfriend and best friend as her primary sources of support. CSW encouraged MOB to continue to rely on her support system.  CSW provided education regarding the baby blues period vs. perinatal mood disorders, discussed treatment and gave additional resources for mental health follow up if concerns arise.  CSW recommended MOB self-evaluation during the  postpartum time period using the New Mom Checklist from Postpartum Progress and encouraged MOB to contact a medical professional if symptoms are noted at any time. CSW assessed MOB safety. MOB denied SI/HI.  CSW inquired if MOB had essential item to care for her infant. MOB reported that she had all essential items including a car seat and crib. CSW provided review of Sudden Infant Death Syndrome (SIDS) precautions. MOB verbalized understanding. CSW inquired if MOB had chosen a pediatrician for the infant. MOB reported [employer] and confirmed that she has transportation. MOB reported that her current address is [address] [place], [place]. The address on file is her mother's address. MOB reported that she receives [facility] and will apply for SNAP benefits. CSW assessed MOB for additional needs and MOB reported none.  CSW identifies no further need for intervention and no barriers to discharge at this time.  CSW Plan/Description:  Sudden Infant Death Syndrome (SIDS) Education, No Further Intervention Required/No Barriers to Discharge, Perinatal Mood and Anxiety Disorder (PMADs) Education    Nat DELENA Quiet, LCSW September 01, 2024, 11:24 AM

## 2024-10-02 NOTE — Lactation Note (Signed)
 This note was copied from a baby's chart. 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 She has been taking baby to breast consistently and also pumping; she's getting enough colostrum to collect and to syringe feed baby. Baby already nursing on the R side in football hold when entered the room; noticed that she didn't have enough depth even after repositioning; baby doesn't open her mouth wide, when doing suck training and syringe feeding, he would clamp her mouth and bite on gloved finger. Once NS # 20 was initiated she was able to get a wide angle with a much larger open mouth but she needed stimulation (syringe feeding simultaneously) for sucking (see LATCH score).   Ellen Huffman voiced that feedings at the breast are comfortable with and without the NS # 20; she proceeded to burp baby; she fell asleep several times during this 30 minutes feeding. Explained to parents that if they are going to continue using NS # 20 pumping needs to be a bit more consistent; at least 4-6 times/24 hours for added stimulation. Reviewed normal newborn behavior, feeding cues, pumping schedule and anticipatory guidelines.   Feeding Mother's Current Feeding Choice: Breast Milk  LATCH Score Latch: Repeated attempts needed to sustain latch, nipple held in mouth throughout feeding, stimulation needed to elicit sucking reflex.  Audible Swallowing: A few with stimulation (with syringe)  Type of Nipple: Flat (very short shafted)  Comfort (Breast/Nipple): Soft / non-tender  Hold (Positioning): Assistance needed to correctly  position infant at breast and maintain latch.  LATCH Score: 6  Lactation Tools Discussed/Used Tools: Pump;Flanges;Nipple Shields;84F feeding tube / Syringe Nipple shield size: 20 Flange Size: 21 Breast pump type: Double-Electric Breast Pump Pump Education: Setup, frequency, and cleaning;Milk Storage Reason for Pumping: support milk supply Pumping frequency: 3 times/24 hours, recommended pumping after every other feeding/attempt at the breast Pumped volume: 12 mL  Interventions Interventions: Breast feeding basics reviewed;Skin to skin;Breast massage;Hand express;Breast compression;Support pillows;Adjust position;DEBP;Education  Plan STS whenever possible Offer the breast +8 times/24 hours or sooner if feeding cues are present; using NS # 20 PRN Pump after every other feeding/attempt at the breast and continue supplementing baby with any amount of EBM she might get  FOB present and supportive. All questions and concerns answered, family to contact [facility] services PRN.  Discharge Pump: Hands Free;Personal (Zomee from insurance)  Consult Status Consult Status: Follow-up Date: 10/03/24 Follow-up type: In-patient   Ellen Huffman 10/02/2024, 1:51 PM

## 2024-10-03 ENCOUNTER — Other Ambulatory Visit (HOSPITAL_COMMUNITY): Payer: Self-pay

## 2024-10-03 MED ORDER — OXYCODONE HCL 5 MG PO TABS
5.0000 mg | ORAL_TABLET | Freq: Four times a day (QID) | ORAL | 0 refills | Status: AC | PRN
  Filled 2024-10-03: qty 20, 5d supply, fill #0

## 2024-10-03 MED ORDER — DOCUSATE SODIUM 100 MG PO CAPS
100.0000 mg | ORAL_CAPSULE | Freq: Two times a day (BID) | ORAL | 0 refills | Status: AC
  Filled 2024-10-03: qty 20, 10d supply, fill #0

## 2024-10-03 MED ORDER — ACETAMINOPHEN 325 MG PO TABS: 650.0000 mg | ORAL_TABLET | Freq: Four times a day (QID) | ORAL | Status: DC | PRN

## 2024-10-03 MED ORDER — FUROSEMIDE 20 MG PO TABS
20.0000 mg | ORAL_TABLET | Freq: Every day | ORAL | Status: DC
  Administered 2024-10-03: 20 mg via ORAL
  Filled 2024-10-03: qty 1

## 2024-10-03 MED ORDER — FUROSEMIDE 20 MG PO TABS
20.0000 mg | ORAL_TABLET | Freq: Every day | ORAL | 0 refills | Status: DC
  Filled 2024-10-03: qty 5, 5d supply, fill #0

## 2024-10-03 MED ORDER — POTASSIUM CHLORIDE CRYS ER 20 MEQ PO TBCR
20.0000 meq | EXTENDED_RELEASE_TABLET | Freq: Every day | ORAL | Status: DC
Start: 2024-10-03 — End: 2024-10-03
  Administered 2024-10-03: 20 meq via ORAL
  Filled 2024-10-03: qty 2

## 2024-10-03 MED ORDER — POTASSIUM CHLORIDE CRYS ER 20 MEQ PO TBCR
20.0000 meq | EXTENDED_RELEASE_TABLET | Freq: Every day | ORAL | 0 refills | Status: DC
  Filled 2024-10-03: qty 4, 4d supply, fill #0

## 2024-10-03 MED ORDER — IBUPROFEN 600 MG PO TABS
600.0000 mg | ORAL_TABLET | Freq: Four times a day (QID) | ORAL | 0 refills | Status: DC
  Filled 2024-10-03: qty 30, 8d supply, fill #0

## 2024-10-03 NOTE — Plan of Care (Signed)
  Problem: Education: Goal: Knowledge of disease or condition will improve Outcome: Progressing Goal: Knowledge of the prescribed therapeutic regimen will improve Outcome: Progressing   Problem: Fluid Volume: Goal: Peripheral tissue perfusion will improve Outcome: Progressing   Problem: Clinical Measurements: Goal: Complications related to disease process, condition or treatment will be avoided or minimized Outcome: Progressing   Problem: Clinical Measurements: Goal: Complications related to disease process, condition or treatment will be avoided or minimized Outcome: Progressing   Problem: Education: Goal: Knowledge of General Education information will improve Description: Including pain rating scale, medication(s)/side effects and non-pharmacologic comfort measures Outcome: Progressing

## 2024-10-03 NOTE — Lactation Note (Signed)
 This note was copied from a baby's chart. 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 Her supply has also increased, her milk came in last night, praised her for all her efforts.   Parents are taking Ellen Huffman home today. Reviewed discharge education and the importance of consistent nipple stimulation whether is with the baby's mouth, pumping or both for the prevention of engorgement and to protect her supply.  Encouraged to continue offering the breast +8 times/24 hours or sooner if feeding cues and to pump after every other feeding. Referral to [facility] sent for [facility] OP F/U. FOB present and supportive. All questions and concerns answered, family to contact [facility] services PR[facility].  Feeding Mother's Current Feeding Choice: Breast Milk  Lactation Tools Discussed/Used Tools: Pump;Flanges;Nipple Shields Nipple shield size: 20 Flange Size: 21 Breast pump type: Double-Electric Breast Pump Pump Education: Setup, frequency, and cleaning;Milk Storage Reason for Pumping: support milk supply Pumping frequency: 5 times/24 hours Pumped volume: 80 mL  Interventions Interventions: Breast feeding basics reviewed;Coconut oil;DEBP;Education;NICU Pumping Log  Discharge Discharge Education: Engorgement and breast care;Warning signs for feeding baby;Outpatient recommendation;Outpatient Epic message sent Pump: Hands Free;Personal ([place] from insurance)  Consult Status Consult Status: Complete Date: 10/03/24 Follow-up type: In-patient   Ricci Dirocco  GORMAN Crate 10/03/2024, 12:37 PM

## 2024-10-03 NOTE — Progress Notes (Signed)
 POSTPARTUM PROGRESS NOTE  POD #3  Subjective:  Ellen Huffman is a 23 y.o. [id number] s/p pLTSC at [redacted]w[redacted]d. Today she notes she is doing well. She denies any problems with ambulating, voiding or po intake. Denies nausea or vomiting. She has passing flatus, still no BM.  Pain is well controlled.  Lochia appropriate Denies fever/chills/chest pain/SOB.  no HA, no blurry vision, no RUQ pain  Objective: Blood pressure (!) 159/71, pulse 85, temperature 97.7 F (36.5 C), temperature source Oral, resp. rate 18, height 5' 6 (1.676 m), weight 107.6 kg, last menstrual period 12/15/2023, SpO2 97%, unknown if currently breastfeeding.  BP range: 122-159/61-71  Physical Exam:  General: alert, cooperative and no distress Chest: no respiratory distress Heart: regular rate and rhythm Abdomen: soft, nontender, +BS Uterine Fundus: firm, appropriately tender Incision: C/D/I with honeycomb DVT Evaluation: No calf swelling or tenderness Extremities: 1+ edema Skin: warm, dry  No results found for this or any previous visit (from the past 24 hours).  Assessment/Plan: Ellen Huffman is a 23 y.o. [phone] s/p pLTCS at [redacted]w[redacted]d POD#3 complicated by: 1) Preeclampsia with severe features -elevated BP x 1 this am -plan to start Lasix/K-dur -currently asymptomatic  2) Postop -pain well controlled -encourage ambulation -reviewed stool softener and other OTC remedies to help with BM upon discharge  Contraception: condoms Feeding: breast  Dispo: Pending BP this am- may consider starting anti-hypertensive.  Plan for discharge home later today   LOS: 3 days   Amiel Mccaffrey, DO Faculty Attending, Center for [facility] 10/03/2024, 6:47 AM

## 2024-10-03 NOTE — Progress Notes (Signed)
Discharge instructions and prescriptions given to pt. Discussed post c-section care, signs and symptoms to report to the MD, upcoming appointments, and meds. Pt verbalizes understanding and has no questions or concerns at this time. Pt discharged home from hospital with baby in stable condition.

## 2024-10-06 ENCOUNTER — Other Ambulatory Visit

## 2024-10-10 ENCOUNTER — Encounter: Payer: Self-pay | Admitting: Obstetrics and Gynecology

## 2024-10-10 ENCOUNTER — Ambulatory Visit: Admitting: Obstetrics and Gynecology

## 2024-10-10 VITALS — BP 126/86 | HR 65 | Ht 62.0 in | Wt 218.0 lb

## 2024-10-10 DIAGNOSIS — Z8759 Personal history of other complications of pregnancy, childbirth and the puerperium: Secondary | ICD-10-CM

## 2024-10-10 DIAGNOSIS — Z98891 History of uterine scar from previous surgery: Secondary | ICD-10-CM

## 2024-10-10 NOTE — Progress Notes (Signed)
    PostOp Visit Note  Ellen Huffman is a 23 y.o. [phone] female who presents for a postoperative visit. She is 1 week postop following a cesarean delivery completed on 09/30/24   Today she notes she is doing well overall.  Denies fever or chills.  Tolerating gen diet.  +Flatus, Regular BMs.  Pain is good, she took pain meds first day or two and will take OTC meds as needed.  Overall doing well and reports no acute complaints   Review of Systems Pertinent items are noted in HPI.    Objective:  BP 126/86 (BP Location: Right Arm, Patient Position: Sitting, Cuff Size: Large)   Pulse 65   Ht 5' 2 (1.575 m)   Wt 218 lb (98.9 kg)   LMP 12/15/2023   Breastfeeding Yes   BMI 39.87 kg/m    Physical Examination:  GENERAL ASSESSMENT: well developed and well nourished SKIN: normal color, no lesions CHEST:respiratory effort normal  ABDOMEN: soft, non-distended INCISION: well approximated, no drainage or erythema present  EXTREMITY: normal and symmetric movement, normal range of motion PSYCH: mood appropriate, normal affect       Assessment:  1. S/P cesarean section (Primary) Healing well, discussed incision care   2. History of severe pre-eclampsia Normotensive, no meds  Plan:   Return for postpartum visit    Nidia Daring, FNP Center for [employer], [facility]

## 2024-11-05 ENCOUNTER — Ambulatory Visit: Admitting: Advanced Practice Midwife

## 2024-11-05 ENCOUNTER — Encounter: Payer: Self-pay | Admitting: Advanced Practice Midwife

## 2024-11-05 DIAGNOSIS — Z1332 Encounter for screening for maternal depression: Secondary | ICD-10-CM | POA: Diagnosis not present

## 2024-11-05 NOTE — Progress Notes (Signed)
 POSTPARTUM VISIT Patient name: Ellen Huffman MRN [id number]  Date of birth: [birth year] Chief Complaint:   Postpartum Care  History of Present Illness:   Ellen Huffman is a 23 y.o. [phone] Caucasian female being seen today for a postpartum visit. She is 5 weeks postpartum following a primary cesarean section, low transverse incision at 39.1 gestational weeks. IOL: no, for new dx severe pre-e and breech presentation. Anesthesia: spinal.  Laceration: n/a.  Complications: mag x 24h PP for severe pre-e. Inpatient contraception: no.   Pregnancy complicated by severe pre-e; breech presentation; resolved FGR . Tobacco use: former . Substance use disorder: no. Last pap smear: April 2025 and results were NILM w/ HRHPV negative. Next pap smear due: April 2028 Patient's last menstrual period was 11/04/2024 (exact date).  Postpartum course has been uncomplicated. Bleeding flow about like a period (just started; first menses since del). Bowel function is normal. Bladder function is normal. Urinary incontinence? no, fecal incontinence? no Patient is not sexually active. Last sexual activity: prior to birth of baby. Desired contraception: condoms (not in a relationship now) . Patient does want a pregnancy in the future.  Desired family size is unsure number of children.   Upstream - 11/05/24 1340       Pregnancy Intention Screening   Does the patient want to become pregnant in the next year? No    Does the patient's partner want to become pregnant in the next year? No    Would the patient like to discuss contraceptive options today? No      Contraception Wrap Up   Current Method Abstinence         The pregnancy intention screening data noted above was reviewed. Potential methods of contraception were discussed. The patient elected to proceed with No data recorded.  Edinburgh Postpartum Depression Screening: negative  Edinburgh Postnatal Depression Scale - 11/05/24 1341       Edinburgh Postnatal  Depression Scale:  In the Past 7 Days   I have been able to laugh and see the funny side of things. 0    I have looked forward with enjoyment to things. 0    I have blamed myself unnecessarily when things went wrong. 0    I have been anxious or worried for no good reason. 0    I have felt scared or panicky for no good reason. 0    Things have been getting on top of me. 0    I have been so unhappy that I have had difficulty sleeping. 0    I have felt sad or miserable. 0    I have been so unhappy that I have been crying. 0    The thought of harming myself has occurred to me. 0    Edinburgh Postnatal Depression Scale Total 0             09/11/2024    3:13 PM 03/24/2024    1:35 PM 01/18/2023   10:44 AM 12/08/2022    9:28 AM  GAD 7 : Generalized Anxiety Score  Nervous, Anxious, on Edge 0 2 3 0  Control/stop worrying 0 3 3 0  Worry too much - different things 0 3 3 0  Trouble relaxing 0 1 2 0  Restless 0 0 2 0  Easily annoyed or irritable 0 2 3 2   Afraid - awful might happen 0 2 1 2   Total GAD 7 Score 0 13 17 4   Anxiety Difficulty    Not difficult at all  Baby's course has been complicated by tongue tie making breastfeeding difficult. Baby is feeding by bottle. Infant has a pediatrician/family doctor? Yes.  Childcare strategy if returning to work/school: wants to find a work from home job.  Pt has material needs met for her and baby: Yes.   Review of Systems:   Pertinent items are noted in HPI Denies Abnormal vaginal discharge w/ itching/odor/irritation, headaches, visual changes, shortness of breath, chest pain, abdominal pain, severe nausea/vomiting, or problems with urination or bowel movements. Pertinent History Reviewed:  Reviewed past medical,surgical, obstetrical and family history.  Reviewed problem list, medications and allergies. OB History  Gravida Para Term Preterm AB Living  4 1 1  3 1   SAB IAB Ectopic Multiple Live Births  3   0 1    # Outcome Date GA Lbr  Len/2nd Weight Sex Type Anes PTL Lv  4 Term 09/30/24 [redacted]w[redacted]d  6 lb 8.8 oz (2.97 kg) F CS-LTranv Spinal  LIV  3 SAB 05/2023          2 SAB 11/2022 [redacted]w[redacted]d    SAB     1 SAB 09/27/21 [redacted]w[redacted]d          Physical Assessment:   Vitals:   11/05/24 1338  BP: 117/80  Pulse: 79  Weight: 215 lb (97.5 kg)  Height: 5' 6 (1.676 m)  Body mass index is 34.7 kg/m.       Physical Examination:   General appearance: alert, well appearing, and in no distress  Mental status: alert, oriented to person, place, and time  Skin: warm & dry   Cardiovascular: normal heart rate noted   Respiratory: normal respiratory effort, no distress   Breasts: deferred, no complaints   Abdomen: soft, non-tender; LTCS incision well-healed without s/s infection  Pelvic: examination not indicated. Thin prep pap obtained: No  Rectal: not examined  Extremities: Edema: none        No results found for this or any previous visit (from the past 24 hours).  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  Mood and well being:  If positive depression screen, discussed and plan developed.  If using tobacco we discussed reduction/cessation and risk of relapse If current substance abuse, we discussed and referral to local resources was offered.   2. Infant care and feeding:  If breastfeeding, discussed returning to work, pumping, breastfeeding-associated pain, guidance regarding return to fertility while lactating if not using another method. If needed, patient was provided with a letter to be allowed to pump q 2-3hrs to support lactation in a private location with access to a refrigerator to store  breastmilk.   Recommended that all caregivers be immunized for flu, pertussis and other preventable communicable diseases If pt does not have material needs met for her/baby, referred to local resources for help obtaining these.  3. Sexuality, contraception and birth spacing Provided guidance regarding sexuality, management of dyspareunia, and resumption of intercourse Discussed avoiding interpregnancy interval <51mths and recommended birth spacing of 18 months  4. Sleep and fatigue Discussed coping options for fatigue and sleep disruption Encouraged family/partner/community support of 4 hrs of uninterrupted sleep to help with mood and fatigue  5. Physical recovery  If pt had a C/S, assessed incisional pain and providing guidance on normal vs prolonged recovery If pt had a laceration, perineal healing and pain reviewed.  If urinary or fecal incontinence, discussed management and referred to PT or uro/gyn if indicated  Patient is safe to resume physical activity. Discussed attainment of healthy weight.  6.  Chronic disease management Discussed pregnancy complications if any, and their implications for future childbearing and long-term maternal health. Review recommendations for prevention of recurrent pregnancy complications, such as 17 hydroxyprogesterone caproate to reduce risk for recurrent PTB not applicable, or aspirin  to reduce risk of preeclampsia yes. Pt had GDM: no. If yes, 2hr GTT scheduled: not applicable. Reviewed medications and non-pregnant dosing including consideration of whether pt is breastfeeding using a reliable resource such as LactMed: not applicable Referred for f/u w/ PCP or subspecialist providers as indicated: recommended; may try [facility]  7. Health maintenance Mammogram at 23yo or earlier if indicated Pap smears as indicated  Meds: No orders of the defined types were placed in this encounter.   Follow-up: Return in about 1 year (around 11/05/2025) for  Physical.   No orders of the defined types were placed in this encounter.   Suzen JONETTA Gentry CNM 11/05/2024 2:21 PM

## 2024-11-05 NOTE — Patient Instructions (Signed)
Use the website www.postpartum.net for helpful resources!

## 2024-12-06 ENCOUNTER — Encounter: Payer: Self-pay | Admitting: Obstetrics & Gynecology

## 2024-12-08 ENCOUNTER — Encounter: Payer: Self-pay | Admitting: Obstetrics & Gynecology

## 2024-12-08 ENCOUNTER — Ambulatory Visit: Admitting: *Deleted

## 2024-12-08 DIAGNOSIS — Z3201 Encounter for pregnancy test, result positive: Secondary | ICD-10-CM

## 2024-12-08 LAB — POCT URINE PREGNANCY: Preg Test, Ur: POSITIVE — AB

## 2024-12-08 NOTE — Progress Notes (Signed)
° °  NURSE VISIT- PREGNANCY CONFIRMATION   SUBJECTIVE:  Ellen Huffman is a 23 y.o. [phone] female at [redacted]w[redacted]d by certain LMP of Patient's last menstrual period was 11/06/2024. Here for pregnancy confirmation.  Home pregnancy test: positive x 2  She reports no complaints.  She is taking prenatal vitamins.    OBJECTIVE:  LMP 11/06/2024   Breastfeeding Yes   Appears well, in no apparent distress  No results found for this or any previous visit (from the past 24 hours).  ASSESSMENT: Positive pregnancy test, [redacted]w[redacted]d by LMP    PLAN: Schedule for dating ultrasound in 3 weeks Prenatal vitamins: continue   Nausea medicines: not currently needed   OB packet given: Yes  Alan LITTIE Fischer  12/08/2024 2:26 PM

## 2024-12-22 ENCOUNTER — Other Ambulatory Visit: Payer: Self-pay | Admitting: Obstetrics & Gynecology

## 2024-12-22 DIAGNOSIS — O2620 Pregnancy care for patient with recurrent pregnancy loss, unspecified trimester: Secondary | ICD-10-CM

## 2024-12-22 DIAGNOSIS — O3680X0 Pregnancy with inconclusive fetal viability, not applicable or unspecified: Secondary | ICD-10-CM

## 2024-12-29 ENCOUNTER — Other Ambulatory Visit: Admitting: Radiology

## 2024-12-29 DIAGNOSIS — O2620 Pregnancy care for patient with recurrent pregnancy loss, unspecified trimester: Secondary | ICD-10-CM

## 2024-12-29 DIAGNOSIS — O26841 Uterine size-date discrepancy, first trimester: Secondary | ICD-10-CM

## 2024-12-29 DIAGNOSIS — O3680X0 Pregnancy with inconclusive fetal viability, not applicable or unspecified: Secondary | ICD-10-CM

## 2024-12-29 DIAGNOSIS — O3400 Maternal care for unspecified congenital malformation of uterus, unspecified trimester: Secondary | ICD-10-CM

## 2024-12-29 NOTE — Progress Notes (Signed)
 US : GA by LMP = 11-06-25 (exact) = 7+4 weeks Anteverted bicornuate uterus with single viable early IUP.  10 days < dates with FHR = 96 bpm.  GS intact within upper mid uterine cavity CRL = 4.9 mm = 6+1 weeks,  Yolk Sac = 3.7 mm,  Normal ov's,  Neg adnexal regions - neg CDS - no free fluid present  Follow up US  scheduled with provider in 2 weeks

## 2025-01-09 ENCOUNTER — Other Ambulatory Visit: Payer: Self-pay | Admitting: Obstetrics & Gynecology

## 2025-01-09 DIAGNOSIS — O3680X0 Pregnancy with inconclusive fetal viability, not applicable or unspecified: Secondary | ICD-10-CM

## 2025-01-12 ENCOUNTER — Ambulatory Visit

## 2025-01-12 ENCOUNTER — Other Ambulatory Visit: Payer: Self-pay | Admitting: Obstetrics & Gynecology

## 2025-01-12 ENCOUNTER — Ambulatory Visit (INDEPENDENT_AMBULATORY_CARE_PROVIDER_SITE_OTHER): Admitting: Adult Health

## 2025-01-12 ENCOUNTER — Encounter: Payer: Self-pay | Admitting: Adult Health

## 2025-01-12 VITALS — BP 125/83 | HR 76 | Ht 66.0 in | Wt 219.5 lb

## 2025-01-12 DIAGNOSIS — Z789 Other specified health status: Secondary | ICD-10-CM | POA: Diagnosis not present

## 2025-01-12 DIAGNOSIS — O3680X0 Pregnancy with inconclusive fetal viability, not applicable or unspecified: Secondary | ICD-10-CM

## 2025-01-12 DIAGNOSIS — Q513 Bicornate uterus: Secondary | ICD-10-CM

## 2025-01-12 DIAGNOSIS — O039 Complete or unspecified spontaneous abortion without complication: Secondary | ICD-10-CM | POA: Diagnosis not present

## 2025-01-12 DIAGNOSIS — Z1321 Encounter for screening for nutritional disorder: Secondary | ICD-10-CM | POA: Insufficient documentation

## 2025-01-12 NOTE — Progress Notes (Signed)
 US  6+1 wks,single IUP no FHR,bicornuate uterus,pregnancy in right horn,normal ovaries,CRL 4.3 mm

## 2025-01-12 NOTE — Progress Notes (Signed)
" °  Subjective:     Patient ID: Ellen Huffman, female   DOB: 2001/01/23, 24 y.o.   MRN: [id number]  HPI Ellen Huffman is a 24 year old white female,single, G5P1031 in for follow up US , no growth and no fetal heart motion. She has bicornuate uterus, and has a 36 month old that is breast feeding.     Component Value Date/Time   DIAGPAP  04/14/2024 0859    - Negative for intraepithelial lesion or malignancy (NILM)   HPVHIGH Negative 04/14/2024 0859   ADEQPAP  04/14/2024 0859    Satisfactory for evaluation; transformation zone component PRESENT.     Review of Systems No bleeding Reviewed past medical,surgical, social and family history. Reviewed medications and allergies.     Objective:   Physical Exam BP 125/83 (BP Location: Right Arm, Patient Position: Sitting, Cuff Size: Large)   Pulse 76   Ht 5' 6 (1.676 m)   Wt 219 lb 8 oz (99.6 kg)   LMP 11/06/2024   Breastfeeding Yes   BMI 35.43 kg/m     Skin warm and dry.  Lungs: clear to ausculation bilaterally. Cardiovascular: regular rate and rhythm.  Fall risk is low  Upstream - 01/12/25 1631       Pregnancy Intention Screening   Does the patient want to become pregnant in the next year? N/A    Does the patient's partner want to become pregnant in the next year? N/A    Would the patient like to discuss contraceptive options today? N/A      Contraception Wrap Up   Current Method Pregnant/Seeking Pregnancy    End Method Pregnant/Seeking Pregnancy    Contraception Counseling Provided No          Assessment:     1. Miscarriage (Primary) No growth and no FHM Discussed waiting to see what her body does or consider cytotec  Will check QHCG   - Beta hCG quant (ref lab)  2. Encounter for vitamin deficiency screening She request vitamin D check - VITAMIN D 25 Hydroxy (Vit-D Deficiency, Fractures)  3. Bicornuate uterus  4. Breastfeeding (infant)     Plan:     Follow up in 2 weeks     "

## 2025-01-23 ENCOUNTER — Ambulatory Visit: Payer: Self-pay | Admitting: Adult Health

## 2025-01-23 LAB — BETA HCG QUANT (REF LAB): hCG Quant: 8416 m[IU]/mL

## 2025-01-23 LAB — VITAMIN D 25 HYDROXY (VIT D DEFICIENCY, FRACTURES): Vit D, 25-Hydroxy: 19.5 ng/mL — ABNORMAL LOW (ref 30.0–100.0)

## 2025-01-26 ENCOUNTER — Ambulatory Visit: Admitting: Adult Health

## 2025-02-02 ENCOUNTER — Ambulatory Visit: Admitting: Adult Health
